# Patient Record
Sex: Female | Born: 1950
Health system: Southern US, Community
[De-identification: ages and names within clinical notes are randomized; demographics above are authoritative.]

## PROBLEM LIST (undated history)

## (undated) DIAGNOSIS — M62838 Other muscle spasm: Secondary | ICD-10-CM

## (undated) DIAGNOSIS — Z5189 Encounter for other specified aftercare: Secondary | ICD-10-CM

## (undated) DIAGNOSIS — B070 Plantar wart: Secondary | ICD-10-CM

## (undated) DIAGNOSIS — Z8619 Personal history of other infectious and parasitic diseases: Secondary | ICD-10-CM

## (undated) DIAGNOSIS — F419 Anxiety disorder, unspecified: Secondary | ICD-10-CM

## (undated) DIAGNOSIS — I739 Peripheral vascular disease, unspecified: Secondary | ICD-10-CM

## (undated) DIAGNOSIS — K219 Gastro-esophageal reflux disease without esophagitis: Secondary | ICD-10-CM

## (undated) DIAGNOSIS — E785 Hyperlipidemia, unspecified: Secondary | ICD-10-CM

## (undated) DIAGNOSIS — I10 Essential (primary) hypertension: Secondary | ICD-10-CM

## (undated) DIAGNOSIS — M25552 Pain in left hip: Secondary | ICD-10-CM

## (undated) DIAGNOSIS — T148XXA Other injury of unspecified body region, initial encounter: Secondary | ICD-10-CM

## (undated) HISTORY — DX: Personal history of other infectious and parasitic diseases: Z86.19

## (undated) HISTORY — DX: Anxiety disorder, unspecified: F41.9

## (undated) HISTORY — DX: Gastro-esophageal reflux disease without esophagitis: K21.9

## (undated) HISTORY — DX: Other muscle spasm: M62.838

## (undated) HISTORY — DX: Hyperlipidemia, unspecified: E78.5

## (undated) HISTORY — DX: Encounter for other specified aftercare: Z51.89

## (undated) HISTORY — DX: Plantar wart: B07.0

## (undated) HISTORY — DX: Other injury of unspecified body region, initial encounter: T14.8XXA

## (undated) HISTORY — DX: Pain in left hip: M25.552

## (undated) HISTORY — DX: Essential (primary) hypertension: I10

## (undated) HISTORY — PX: COLECTOMY: SHX59

---

## 1980-09-03 DIAGNOSIS — Z5189 Encounter for other specified aftercare: Secondary | ICD-10-CM

## 1980-09-03 HISTORY — DX: Encounter for other specified aftercare: Z51.89

## 1980-09-03 HISTORY — PX: ABDOMINAL HYSTERECTOMY: SHX81

## 1999-09-04 DIAGNOSIS — T148XXA Other injury of unspecified body region, initial encounter: Secondary | ICD-10-CM

## 1999-09-04 HISTORY — DX: Other injury of unspecified body region, initial encounter: T14.8XXA

## 2003-09-04 HISTORY — PX: CARPAL TUNNEL RELEASE: SHX101

## 2005-09-03 HISTORY — PX: OTHER SURGICAL HISTORY: SHX169

## 2012-06-26 ENCOUNTER — Ambulatory Visit
Admission: RE | Admit: 2012-06-26 | Discharge: 2012-06-26 | Disposition: A | Discharge status: Home / Self Care | Payer: Medicaid Other | Source: Ambulatory Visit | Attending: Family Medicine | Admitting: Family Medicine

## 2012-06-26 ENCOUNTER — Other Ambulatory Visit: Payer: Self-pay | Admitting: Family Medicine

## 2012-06-26 DIAGNOSIS — R05 Cough: Secondary | ICD-10-CM

## 2012-06-26 DIAGNOSIS — R059 Cough, unspecified: Secondary | ICD-10-CM

## 2012-06-26 DIAGNOSIS — M25579 Pain in unspecified ankle and joints of unspecified foot: Secondary | ICD-10-CM

## 2012-07-23 ENCOUNTER — Other Ambulatory Visit: Payer: Self-pay | Admitting: Family Medicine

## 2012-07-23 DIAGNOSIS — Z1231 Encounter for screening mammogram for malignant neoplasm of breast: Secondary | ICD-10-CM

## 2012-09-11 ENCOUNTER — Ambulatory Visit
Admission: RE | Admit: 2012-09-11 | Discharge: 2012-09-11 | Disposition: A | Discharge status: Home / Self Care | Payer: Medicaid Other | Source: Ambulatory Visit | Attending: Family Medicine | Admitting: Family Medicine

## 2012-09-11 DIAGNOSIS — Z1231 Encounter for screening mammogram for malignant neoplasm of breast: Secondary | ICD-10-CM

## 2013-08-25 ENCOUNTER — Other Ambulatory Visit: Payer: Self-pay

## 2013-08-25 DIAGNOSIS — Z1231 Encounter for screening mammogram for malignant neoplasm of breast: Secondary | ICD-10-CM

## 2013-09-17 ENCOUNTER — Ambulatory Visit: Payer: Self-pay

## 2013-09-22 ENCOUNTER — Ambulatory Visit
Admission: RE | Admit: 2013-09-22 | Discharge: 2013-09-22 | Disposition: A | Discharge status: Home / Self Care | Payer: Self-pay | Source: Ambulatory Visit

## 2013-09-22 DIAGNOSIS — Z1231 Encounter for screening mammogram for malignant neoplasm of breast: Secondary | ICD-10-CM

## 2013-09-23 ENCOUNTER — Encounter: Payer: Self-pay | Admitting: Gastroenterology

## 2013-10-01 ENCOUNTER — Ambulatory Visit (AMBULATORY_SURGERY_CENTER): Payer: Self-pay | Admitting: *Deleted

## 2013-10-01 VITALS — Ht 66.0 in | Wt 171.8 lb

## 2013-10-01 DIAGNOSIS — Z1211 Encounter for screening for malignant neoplasm of colon: Secondary | ICD-10-CM

## 2013-10-01 MED ORDER — NA SULFATE-K SULFATE-MG SULF 17.5-3.13-1.6 GM/177ML PO SOLN: 1.0000 | Freq: Once | ORAL | Status: DC

## 2013-10-01 NOTE — Progress Notes (Signed)
No allergies to eggs or soy. No problems with anesthesia.  

## 2013-10-05 ENCOUNTER — Other Ambulatory Visit: Payer: Self-pay | Admitting: Family Medicine

## 2013-10-05 ENCOUNTER — Ambulatory Visit
Admission: RE | Admit: 2013-10-05 | Discharge: 2013-10-05 | Disposition: A | Discharge status: Home / Self Care | Payer: Medicaid Other | Source: Ambulatory Visit | Attending: Family Medicine | Admitting: Family Medicine

## 2013-10-05 DIAGNOSIS — J209 Acute bronchitis, unspecified: Secondary | ICD-10-CM

## 2013-10-05 DIAGNOSIS — R059 Cough, unspecified: Secondary | ICD-10-CM

## 2013-10-05 DIAGNOSIS — R05 Cough: Secondary | ICD-10-CM

## 2013-10-15 ENCOUNTER — Ambulatory Visit (AMBULATORY_SURGERY_CENTER): Payer: Medicaid Other | Admitting: Gastroenterology

## 2013-10-15 ENCOUNTER — Encounter: Payer: Self-pay | Admitting: Gastroenterology

## 2013-10-15 VITALS — BP 195/109 | HR 72 | Temp 96.0°F | Resp 17 | Ht 66.0 in | Wt 171.0 lb

## 2013-10-15 DIAGNOSIS — D126 Benign neoplasm of colon, unspecified: Secondary | ICD-10-CM

## 2013-10-15 DIAGNOSIS — K5730 Diverticulosis of large intestine without perforation or abscess without bleeding: Secondary | ICD-10-CM

## 2013-10-15 DIAGNOSIS — Z1211 Encounter for screening for malignant neoplasm of colon: Secondary | ICD-10-CM

## 2013-10-15 DIAGNOSIS — K648 Other hemorrhoids: Secondary | ICD-10-CM

## 2013-10-15 MED ORDER — SODIUM CHLORIDE 0.9 % IV SOLN: 500.0000 mL | INTRAVENOUS | Status: DC

## 2013-10-15 NOTE — Patient Instructions (Signed)
YOU HAD AN ENDOSCOPIC PROCEDURE TODAY AT THE [facility]: Refer to the procedure report that was given to you for any specific questions about what was found during the examination.  If the procedure report does not answer your questions, please call your gastroenterologist to clarify.  If you requested that your care partner not be given the details of your procedure findings, then the procedure report has been included in a sealed envelope for you to review at your convenience later.  YOU SHOULD EXPECT: Some feelings of bloating in the abdomen. Passage of more gas than usual.  Walking can help get rid of the air that was put into your GI tract during the procedure and reduce the bloating. If you had a lower endoscopy (such as a colonoscopy or flexible sigmoidoscopy) you may notice spotting of blood in your stool or on the toilet paper. If you underwent a bowel prep for your procedure, then you may not have a normal bowel movement for a few days.  DIET: Your first meal following the procedure should be a light meal and then it is ok to progress to your normal diet.  A half-sandwich or bowl of soup is an example of a good first meal.  Heavy or fried foods are harder to digest and may make you feel nauseous or bloated.  Likewise meals heavy in dairy and vegetables can cause extra gas to form and this can also increase the bloating.  Drink plenty of fluids but you should avoid alcoholic beverages for 24 hours.  ACTIVITY: Your care partner should take you home directly after the procedure.  You should plan to take it easy, moving slowly for the rest of the day.  You can resume normal activity the day after the procedure however you should NOT DRIVE or use heavy machinery for 24 hours (because of the sedation medicines used during the test).    SYMPTOMS TO REPORT IMMEDIATELY: A gastroenterologist can be reached at any hour.  During normal business hours, 8:30 AM to 5:00 PM Monday through Friday,  call [phone].  After hours and on weekends, please call the GI answering service at [phone] who will take a message and have the physician on call contact you.   Following lower endoscopy (colonoscopy or flexible sigmoidoscopy):  Excessive amounts of blood in the stool  Significant tenderness or worsening of abdominal pains  Swelling of the abdomen that is new, acute  Fever of 100F or higher   FOLLOW UP: If any biopsies were taken you will be contacted by phone or by letter within the next 1-3 weeks.  Call your gastroenterologist if you have not heard about the biopsies in 3 weeks.  Our staff will call the home number listed on your records the next business day following your procedure to check on you and address any questions or concerns that you may have at that time regarding the information given to you following your procedure. This is a courtesy call and so if there is no answer at the home number and we have not heard from you through the emergency physician on call, we will assume that you have returned to your regular daily activities without incident.  SIGNATURES/CONFIDENTIALITY: You and/or your care partner have signed paperwork which will be entered into your electronic medical record.  These signatures attest to the fact that that the information above on your After Visit Summary has been reviewed and is understood.  Full responsibility of the confidentiality of   this discharge information lies with you and/or your care-partner.    Handouts were given to your care partner on polyps, diverticulosis, a high fiber diet with liberal fluid intake and hemorrhoids. You may resume your other current medications today. Please call if any questions or concerns.

## 2013-10-15 NOTE — Op Note (Signed)
[facility]  [address] [place], [address]   COLONOSCOPY PROCEDURE REPORT  PATIENT: Margaret Kelly, Margaret Kelly  MR#: [id number] BIRTHDATE: [birth year] , 62  yrs. old GENDER: Female ENDOSCOPIST: Inda Castle, MD REFERRED JK:KXFGH Blount, M.D. PROCEDURE DATE:  10/15/2013 PROCEDURE:   Colonoscopy with snare polypectomy First Screening Colonoscopy - Avg.  risk and is 50 yrs.  old or older - No.  Prior Negative Screening - Now for repeat screening. 10 or more years since last screening  History of Adenoma - Now for follow-up colonoscopy & has been > or = to 3 yrs.  N/A  Polyps Removed Today? Yes. ASA CLASS:   Class II INDICATIONS:Average risk patient for colon cancer. MEDICATIONS: MAC sedation, administered by CRNA and propofol (Diprivan) 400mg  IV  DESCRIPTION OF PROCEDURE:   After the risks benefits and alternatives of the procedure were thoroughly explained, informed consent was obtained.  On digital exam rectal prolapse was noted.        The LB [id number]  endoscope was introduced through the anus and advanced to the cecum, which was identified by both the appendix and ileocecal valve. No adverse events experienced.   The quality of the prep was Suprep good  The instrument was then slowly withdrawn as the colon was fully examined.      COLON FINDINGS: A sessile polyp measuring 10 mm in size was found in the sigmoid colon.  A polypectomy was performed.  The resection was complete and the polyp tissue was completely retrieved.   Internal hemorrhoids were found.   Mild diverticulosis was noted in the sigmoid colon.  Retroflexed views revealed no abnormalities. The time to cecum=5 minutes 32 seconds.  Withdrawal time=14 minutes 05 seconds.  The scope was withdrawn and the procedure completed. COMPLICATIONS: There were no complications.  ENDOSCOPIC IMPRESSION: 1.   Sessile polyp measuring 10 mm in size was found in the sigmoid colon; polypectomy was  performed 2.   Internal hemorrhoids 3.   Mild diverticulosis was noted in the sigmoid colon 4.   rectal prolapse  RECOMMENDATIONS: If the polyp(s) removed today are proven to be adenomatous (pre-cancerous) polyps, you will need a repeat colonoscopy in 5 years.  Otherwise you should continue to follow colorectal cancer screening guidelines for "routine risk" patients with colonoscopy in 10 years.  You will receive a letter within 1-2 weeks with the results of your biopsy as well as final recommendations.  Please call my office if you have not received a letter after 3 weeks.   eSigned:  Inda Castle, MD 10/15/2013 10:16 AM   cc:   PATIENT NAME:  Margaret Kelly, Margaret Kelly MR#: [id number]

## 2013-10-15 NOTE — Progress Notes (Signed)
I gave the pt a list of her blood pressure while in the recovery room to take to her primary md.  Blood pressure continued to creep up while in recovery room.   She stated she did take her b/p med this am.  Also pt and her friend were going to ride the bus.  She offered to call a cab if would be better.  I recommed ride in a cab rather than a bus.   Her friend was sent to call cab and he will call us when they are out front.  Pt in conference room waithing. Maw

## 2013-10-15 NOTE — Progress Notes (Signed)
Called to room to assist during endoscopic procedure.  Patient ID and intended procedure confirmed with present staff. Received instructions for my participation in the procedure from the performing physician.  

## 2013-10-15 NOTE — Progress Notes (Signed)
Pt states she needs no BP or IVs on her left side due to past injuries

## 2013-10-16 ENCOUNTER — Telehealth: Payer: Self-pay | Admitting: *Deleted

## 2013-10-16 NOTE — Telephone Encounter (Signed)
  Follow up Call-  Call back number 10/15/2013  Post procedure Call Back phone  # [phone]  Permission to leave phone message Yes     Patient questions:  Do you have a fever, pain , or abdominal swelling? no Pain Score  0 *  Have you tolerated food without any problems? yes  Have you been able to return to your normal activities? yes  Do you have any questions about your discharge instructions: Diet   no Medications  no Follow up visit  no  Do you have questions or concerns about your Care? no  Actions: * If pain score is 4 or above: No action needed, pain <4.

## 2013-10-21 ENCOUNTER — Encounter: Payer: Self-pay | Admitting: Gastroenterology

## 2014-08-20 ENCOUNTER — Other Ambulatory Visit: Payer: Self-pay

## 2014-08-20 DIAGNOSIS — Z1231 Encounter for screening mammogram for malignant neoplasm of breast: Secondary | ICD-10-CM

## 2014-09-24 ENCOUNTER — Ambulatory Visit: Payer: Self-pay

## 2014-09-29 ENCOUNTER — Ambulatory Visit
Admission: RE | Admit: 2014-09-29 | Discharge: 2014-09-29 | Disposition: A | Discharge status: Home / Self Care | Payer: Medicaid Other | Source: Ambulatory Visit

## 2014-09-29 DIAGNOSIS — Z1231 Encounter for screening mammogram for malignant neoplasm of breast: Secondary | ICD-10-CM

## 2015-08-23 ENCOUNTER — Other Ambulatory Visit: Payer: Self-pay

## 2015-08-23 DIAGNOSIS — Z1231 Encounter for screening mammogram for malignant neoplasm of breast: Secondary | ICD-10-CM

## 2015-10-03 ENCOUNTER — Ambulatory Visit: Payer: Self-pay

## 2015-10-11 ENCOUNTER — Ambulatory Visit
Admission: RE | Admit: 2015-10-11 | Discharge: 2015-10-11 | Disposition: A | Discharge status: Home / Self Care | Payer: Medicaid Other | Source: Ambulatory Visit

## 2015-10-11 DIAGNOSIS — Z1231 Encounter for screening mammogram for malignant neoplasm of breast: Secondary | ICD-10-CM

## 2015-10-13 ENCOUNTER — Encounter: Payer: Self-pay | Admitting: Family Medicine

## 2015-10-13 ENCOUNTER — Ambulatory Visit (INDEPENDENT_AMBULATORY_CARE_PROVIDER_SITE_OTHER): Payer: Medicaid Other | Admitting: Family Medicine

## 2015-10-13 VITALS — BP 162/78 | HR 89 | Temp 98.3°F | Resp 16 | Ht 64.0 in | Wt 154.0 lb

## 2015-10-13 DIAGNOSIS — M62838 Other muscle spasm: Secondary | ICD-10-CM

## 2015-10-13 DIAGNOSIS — I10 Essential (primary) hypertension: Secondary | ICD-10-CM | POA: Diagnosis not present

## 2015-10-13 DIAGNOSIS — M5442 Lumbago with sciatica, left side: Secondary | ICD-10-CM

## 2015-10-13 DIAGNOSIS — Z8619 Personal history of other infectious and parasitic diseases: Secondary | ICD-10-CM

## 2015-10-13 DIAGNOSIS — E785 Hyperlipidemia, unspecified: Secondary | ICD-10-CM | POA: Diagnosis not present

## 2015-10-13 DIAGNOSIS — G8929 Other chronic pain: Secondary | ICD-10-CM

## 2015-10-13 DIAGNOSIS — J069 Acute upper respiratory infection, unspecified: Secondary | ICD-10-CM

## 2015-10-13 DIAGNOSIS — H938X3 Other specified disorders of ear, bilateral: Secondary | ICD-10-CM

## 2015-10-13 HISTORY — DX: Hyperlipidemia, unspecified: E78.5

## 2015-10-13 HISTORY — DX: Personal history of other infectious and parasitic diseases: Z86.19

## 2015-10-13 HISTORY — DX: Other muscle spasm: M62.838

## 2015-10-13 LAB — COMPLETE METABOLIC PANEL WITH GFR
ALBUMIN: 4.4 g/dL (ref 3.6–5.1)
ALK PHOS: 54 U/L (ref 33–130)
ALT: 19 U/L (ref 6–29)
AST: 22 U/L (ref 10–35)
BUN: 16 mg/dL (ref 7–25)
CO2: 24 mmol/L (ref 20–31)
Calcium: 9.6 mg/dL (ref 8.6–10.4)
Chloride: 103 mmol/L (ref 98–110)
Creat: 0.72 mg/dL (ref 0.50–0.99)
GFR, Est African American: >89 mL/min (ref >=60)
Glucose, Bld: 90 mg/dL (ref 65–99)
Potassium: 4.7 mmol/L (ref 3.5–5.3)
SODIUM: 137 mmol/L (ref 135–146)
Total Bilirubin: 0.6 mg/dL (ref 0.2–1.2)
Total Protein: 7.2 g/dL (ref 6.1–8.1)

## 2015-10-13 LAB — CBC WITH DIFFERENTIAL/PLATELET
BASOS PCT: 1 % (ref 0–1)
Basophils Absolute: 0.1 10*3/uL (ref 0.0–0.1)
Eosinophils Absolute: 0.2 10*3/uL (ref 0.0–0.7)
Eosinophils Relative: 2 % (ref 0–5)
HCT: 42.1 % (ref 36.0–46.0)
HEMOGLOBIN: 14 g/dL (ref 12.0–15.0)
Lymphocytes Relative: 43 % (ref 12–46)
Lymphs Abs: 4 10*3/uL (ref 0.7–4.0)
MCH: 31 pg (ref 26.0–34.0)
MCHC: 33.3 g/dL (ref 30.0–36.0)
MCV: 93.3 fL (ref 78.0–100.0)
MONO ABS: 0.8 10*3/uL (ref 0.1–1.0)
MPV: 9.7 fL (ref 8.6–12.4)
Monocytes Relative: 9 % (ref 3–12)
NEUTROS ABS: 4.2 10*3/uL (ref 1.7–7.7)
Neutrophils Relative %: 45 % (ref 43–77)
Platelets: 339 10*3/uL (ref 150–400)
RBC: 4.51 MIL/uL (ref 3.87–5.11)
RDW: 13.4 % (ref 11.5–15.5)
WBC: 9.4 10*3/uL (ref 4.0–10.5)

## 2015-10-13 LAB — POCT URINALYSIS DIP (DEVICE)
BILIRUBIN URINE: NEGATIVE
Glucose, UA: NEGATIVE mg/dL
KETONES UR: NEGATIVE mg/dL
Leukocytes, UA: NEGATIVE
Nitrite: NEGATIVE
PH: 5.5 (ref 5.0–8.0)
Protein, ur: NEGATIVE mg/dL
SPECIFIC GRAVITY, URINE: 1.01 (ref 1.005–1.030)
Urobilinogen, UA: 0.2 mg/dL (ref 0.0–1.0)

## 2015-10-13 MED ORDER — AZITHROMYCIN 250 MG PO TABS: ORAL_TABLET | ORAL | Status: DC

## 2015-10-13 MED ORDER — CYCLOBENZAPRINE HCL 10 MG PO TABS: 10.0000 mg | ORAL_TABLET | Freq: Three times a day (TID) | ORAL | Status: DC | PRN

## 2015-10-13 NOTE — Patient Instructions (Signed)
Start Coricidin HBP Cough/Cold for increased cough Start Azithromycin 500 mg today and 250 mg on days 2-5  Follow up in office for fasting labs and BP check in 1 month   Sent referral to pain management

## 2015-10-13 NOTE — Progress Notes (Signed)
Subjective:    Patient ID: Margaret Kelly, female    DOB: [birth year], 65 y.o.   MRN: [id number]  HPI Margaret Kelly, a 65 year old female with a history of chronic pain and hypertension presents to establish care. She states that she was a patient of Dr. Lindwood Qua and has not been followed for the past several months. Patient reports a history of chronic back pain. She states that she sustained an accident while working in [place] in 2001. She lost 85% of usage to her left side. She maintains that she has had chronic back pain since that time. She was under the care of pain management while in [place] at [facility]. She states that she has constant lower back pain with sciatica. She is currently taking cyclobenzaprine for muscle spasms and Ibuprofen for pain relief with minimal relief. She says that pain is aggravated by pivoting, bending, prolonged standing, and walking. She denies fever, fatigue, dysuria, bladder or bowel incontinence.   Patient also reports a history of hypertension. She says that hypertension has been controlled on Lisinopril over the past several years. She has been taking medication consistently. She follows a low sodium diet. However, she reports that it is difficult to remain active due to chronic back pain.   Margaret Kelly also reports a history of hepatitis C contracted through a surgery in 1982,  by which she had previous treatment to cure.   Lastly, patient is complaining of upper respiratory symptoms for greater than 1 week. Symptoms include congested cough, nasal congestion, ear pain, and post nasal drip. She has been taking OTC Mucinex DM with minimal relief. She states that she has also increased water intake and vitamin C. She has a history of pneumonia and is a former smoker.   Past Medical History  Diagnosis Date  . Anxiety   . GERD (gastroesophageal reflux disease)   . Hyperlipidemia   . Hypertension   . Nerve damage 2001    left side  as result of fall  . Blood transfusion without reported diagnosis 1982  . Hx of hepatitis C 2010  . Hepatitis C    Past Surgical History  Procedure Laterality Date  . Abdominal hysterectomy  1982  . Foot spur Left 2007    foot  . Carpal tunnel release Left 2005  No Known Allergies   There is no immunization history on file for this patient. Review of Systems  Constitutional: Negative for fatigue and unexpected weight change.  HENT: Positive for congestion, ear pain, postnasal drip and sore throat.   Eyes: Negative.  Negative for visual disturbance.  Respiratory: Positive for cough. Negative for chest tightness.   Cardiovascular: Negative.   Gastrointestinal: Negative.   Endocrine: Negative.   Genitourinary: Negative.   Musculoskeletal: Positive for myalgias and back pain.       Chronic back spasms  Skin: Negative.   Neurological: Positive for dizziness and weakness (Left upper and lower extremities).  Hematological: Negative.   Psychiatric/Behavioral: The patient is nervous/anxious (periodically).         Objective:   Physical Exam  Constitutional: She appears well-developed and well-nourished. She has a sickly appearance.  HENT:  Right Ear: Hearing normal. Tympanic membrane is erythematous.  Left Ear: Hearing normal. Tympanic membrane is erythematous.  Nose: Mucosal edema present.  Mouth/Throat: Normal dentition. No dental caries. Posterior oropharyngeal erythema present.  Eyes: Conjunctivae, EOM and lids are normal. Pupils are equal, round, and reactive to light.  Neck: Trachea normal and normal range of motion.  Cardiovascular: Normal rate and regular rhythm.   Pulmonary/Chest: Effort normal and breath sounds normal. She has no decreased breath sounds.  Abdominal: Soft. Normal appearance. There is no tenderness.  Musculoskeletal:       Lumbar back: She exhibits decreased range of motion, tenderness and spasm.  Lymphadenopathy:       Head (right side): No submental,  no submandibular, no tonsillar and no preauricular adenopathy present.       Head (left side): No submental, no submandibular, no tonsillar and no preauricular adenopathy present.  Neurological: She is alert. No cranial nerve deficit or sensory deficit. She exhibits normal muscle tone.  3/5 strength to left upper extremity. Gait wnl  Skin: Skin is warm, dry and intact.  Psychiatric: She has a normal mood and affect. Her speech is normal and behavior is normal. Judgment and thought content normal.          BP 162/78 mmHg  Pulse 89  Temp(Src) 98.3 F (36.8 C) (Oral)  Resp 16  Ht 5\' 4"  (1.626 m)  Wt 154 lb (69.854 kg)  BMI 26.42 kg/m2 Assessment & Plan:   1. Essential hypertension Blood pressure is above goal on current medication regimen. Patient currently has increased pain. I will not increase medication at this juncture. Patient to return to office for a bp follow up. Will check GFR and urine protein.  - POCT urinalysis dipstick - COMPLETE METABOLIC PANEL WITH GFR - CBC with Differential - EKG 12-Lead - Ambulatory referral to Ophthalmology  2. Chronic bilateral low back pain with left-sided sciatica She was was previously a patient of Dr. Nelva Bush at [facility]. She states that she was last seen greater than 1 year ago for cortisone injections. I recommend that she schedule a follow-up appointment. Patient was previously under the care of pain management in [place] and she is complaining of chronic back pain. I will send a referral to pain management for further evaluation.  - Ambulatory referral to Pain Clinic - Sedimentation Rate - cyclobenzaprine (FLEXERIL) 10 MG tablet; Take 1 tablet (10 mg total) by mouth 3 (three) times daily as needed for muscle spasms.  Dispense: 30 tablet; Refill: 1  3. Muscle spasm of left lower extremity  - cyclobenzaprine (FLEXERIL) 10 MG tablet; Take 1 tablet (10 mg total) by mouth 3 (three) times daily as needed for muscle spasms.  Dispense: 30  tablet; Refill: 1  4. Hyperlipidemia LDL goal <100 Patient is currently on statin therapy. Will return for fasting lipid panel. Also recommend a lowfat, low carbohydrate diet.   5. Acute upper respiratory infection Recommend OTC Coricidin to assist with symptom of cough and congestion. Will also start Azithromycin. Recommend that patient increase fluid intake, rest and handwashing.  - azithromycin (ZITHROMAX) 250 MG tablet; Day 1 500 mg; Days 2-5 250 mg  Dispense: 6 tablet; Refill: 0  6. Congested ear, bilateral Refer to # 5   7. History of hepatitis C Patient contracted Hepatitis C following a surgery in 1982, she had complete treatment to cure with Interferon.    Routine Health Maintenance:  Hysterectomy Colonoscopy: 10/05/2013, recommended a follow up in 10 years   Tarisa Paola M, FNP   The patient was given clear instructions to go to ER or return to medical center if symptoms do not improve, worsen or new problems develop. The patient verbalized understanding. Will notify patient with laboratory results.

## 2015-10-14 LAB — SEDIMENTATION RATE: SED RATE: 12 mm/h (ref 0–30)

## 2015-11-07 ENCOUNTER — Other Ambulatory Visit: Payer: Self-pay | Admitting: Family Medicine

## 2015-11-11 ENCOUNTER — Other Ambulatory Visit (INDEPENDENT_AMBULATORY_CARE_PROVIDER_SITE_OTHER): Payer: Medicaid Other

## 2015-11-11 ENCOUNTER — Other Ambulatory Visit: Payer: Self-pay | Admitting: Family Medicine

## 2015-11-11 DIAGNOSIS — E785 Hyperlipidemia, unspecified: Secondary | ICD-10-CM

## 2015-11-11 LAB — LIPID PANEL
CHOL/HDL RATIO: 2.5 ratio (ref <=5.0)
CHOLESTEROL: 161 mg/dL (ref 125–200)
HDL: 65 mg/dL (ref >=46)
LDL Cholesterol: 77 mg/dL (ref <130)
Triglycerides: 96 mg/dL (ref <150)
VLDL: 19 mg/dL (ref <30)

## 2015-12-09 ENCOUNTER — Other Ambulatory Visit: Payer: Self-pay | Admitting: Family Medicine

## 2015-12-15 ENCOUNTER — Ambulatory Visit: Payer: Self-pay | Admitting: Family Medicine

## 2015-12-26 ENCOUNTER — Other Ambulatory Visit: Payer: Self-pay | Admitting: Family Medicine

## 2016-01-18 ENCOUNTER — Ambulatory Visit: Payer: Self-pay | Admitting: Family Medicine

## 2016-01-23 ENCOUNTER — Other Ambulatory Visit: Payer: Self-pay | Admitting: Family Medicine

## 2016-02-29 ENCOUNTER — Ambulatory Visit: Payer: Self-pay | Admitting: Family Medicine

## 2016-03-01 ENCOUNTER — Other Ambulatory Visit: Payer: Self-pay | Admitting: Family Medicine

## 2016-03-09 ENCOUNTER — Ambulatory Visit: Payer: Medicaid Other

## 2016-03-09 ENCOUNTER — Other Ambulatory Visit: Payer: Self-pay | Admitting: Family Medicine

## 2016-03-12 ENCOUNTER — Telehealth: Payer: Self-pay | Admitting: *Deleted

## 2016-03-12 ENCOUNTER — Other Ambulatory Visit: Payer: Self-pay | Admitting: Family Medicine

## 2016-03-12 DIAGNOSIS — I10 Essential (primary) hypertension: Secondary | ICD-10-CM

## 2016-03-12 MED ORDER — LISINOPRIL-HYDROCHLOROTHIAZIDE 10-12.5 MG PO TABS: 1.0000 | ORAL_TABLET | Freq: Every day | ORAL | Status: DC

## 2016-04-03 ENCOUNTER — Other Ambulatory Visit: Payer: Self-pay | Admitting: Family Medicine

## 2016-04-09 ENCOUNTER — Ambulatory Visit (INDEPENDENT_AMBULATORY_CARE_PROVIDER_SITE_OTHER): Payer: Medicaid Other | Admitting: Family Medicine

## 2016-04-09 ENCOUNTER — Other Ambulatory Visit (HOSPITAL_COMMUNITY): Payer: Self-pay | Admitting: Family Medicine

## 2016-04-09 ENCOUNTER — Encounter: Payer: Self-pay | Admitting: Family Medicine

## 2016-04-09 VITALS — BP 158/86 | HR 80 | Temp 98.1°F

## 2016-04-09 DIAGNOSIS — I10 Essential (primary) hypertension: Secondary | ICD-10-CM

## 2016-04-09 MED ORDER — AMLODIPINE BESYLATE 5 MG PO TABS: 5.0000 mg | ORAL_TABLET | Freq: Every day | ORAL | 1 refills | Status: DC

## 2016-04-23 ENCOUNTER — Ambulatory Visit: Payer: Medicaid Other

## 2016-04-23 ENCOUNTER — Other Ambulatory Visit: Payer: Self-pay | Admitting: Family Medicine

## 2016-04-23 DIAGNOSIS — I10 Essential (primary) hypertension: Secondary | ICD-10-CM

## 2016-04-23 MED ORDER — LISINOPRIL-HYDROCHLOROTHIAZIDE 20-25 MG PO TABS: 1.0000 | ORAL_TABLET | Freq: Every day | ORAL | 3 refills | Status: DC

## 2016-05-17 ENCOUNTER — Other Ambulatory Visit: Payer: Self-pay | Admitting: Family Medicine

## 2016-05-18 ENCOUNTER — Ambulatory Visit (INDEPENDENT_AMBULATORY_CARE_PROVIDER_SITE_OTHER): Payer: Medicaid Other | Admitting: Family Medicine

## 2016-05-18 ENCOUNTER — Encounter: Payer: Self-pay | Admitting: Family Medicine

## 2016-05-18 VITALS — BP 125/74 | HR 98 | Temp 98.6°F | Resp 18 | Ht 65.0 in | Wt 161.0 lb

## 2016-05-18 DIAGNOSIS — I10 Essential (primary) hypertension: Secondary | ICD-10-CM | POA: Diagnosis not present

## 2016-05-18 DIAGNOSIS — M7752 Other enthesopathy of left foot: Secondary | ICD-10-CM

## 2016-05-18 DIAGNOSIS — M779 Enthesopathy, unspecified: Secondary | ICD-10-CM | POA: Diagnosis not present

## 2016-05-18 LAB — CBC WITH DIFFERENTIAL/PLATELET
BASOS ABS: 0 {cells}/uL (ref 0–200)
Basophils Relative: 0 %
Eosinophils Absolute: 186 cells/uL (ref 15–500)
Eosinophils Relative: 2 %
HCT: 40.1 % (ref 35.0–45.0)
HEMOGLOBIN: 13.5 g/dL (ref 11.7–15.5)
Lymphocytes Relative: 41 %
Lymphs Abs: 3813 cells/uL (ref 850–3900)
MCH: 31.3 pg (ref 27.0–33.0)
MCHC: 33.7 g/dL (ref 32.0–36.0)
MCV: 92.8 fL (ref 80.0–100.0)
MPV: 9.5 fL (ref 7.5–12.5)
Monocytes Absolute: 744 cells/uL (ref 200–950)
Monocytes Relative: 8 %
Neutro Abs: 4557 cells/uL (ref 1500–7800)
Neutrophils Relative %: 49 %
Platelets: 368 10*3/uL (ref 140–400)
RBC: 4.32 MIL/uL (ref 3.80–5.10)
RDW: 13.8 % (ref 11.0–15.0)
WBC: 9.3 10*3/uL (ref 3.8–10.8)

## 2016-05-18 LAB — COMPLETE METABOLIC PANEL WITH GFR
ALK PHOS: 47 U/L (ref 33–130)
ALT: 21 U/L (ref 6–29)
AST: 24 U/L (ref 10–35)
Albumin: 4.4 g/dL (ref 3.6–5.1)
BILIRUBIN TOTAL: 0.4 mg/dL (ref 0.2–1.2)
BUN: 17 mg/dL (ref 7–25)
CALCIUM: 9.6 mg/dL (ref 8.6–10.4)
CO2: 29 mmol/L (ref 20–31)
CREATININE: 0.8 mg/dL (ref 0.50–0.99)
Chloride: 105 mmol/L (ref 98–110)
GFR, Est Non African American: 79 mL/min (ref >=60)
Glucose, Bld: 99 mg/dL (ref 65–99)
Potassium: 4.6 mmol/L (ref 3.5–5.3)
Sodium: 141 mmol/L (ref 135–146)
TOTAL PROTEIN: 6.7 g/dL (ref 6.1–8.1)

## 2016-05-18 MED ORDER — SIMVASTATIN 20 MG PO TABS: 20.0000 mg | ORAL_TABLET | Freq: Every day | ORAL | 0 refills | Status: DC

## 2016-05-18 MED ORDER — OMEPRAZOLE 40 MG PO CPDR: 40.0000 mg | DELAYED_RELEASE_CAPSULE | Freq: Every day | ORAL | 0 refills | Status: DC

## 2016-05-18 MED ORDER — CYCLOBENZAPRINE HCL 10 MG PO TABS: ORAL_TABLET | ORAL | 0 refills | Status: DC

## 2016-05-18 NOTE — Progress Notes (Signed)
Patient is here for FU heel pain  Patient complains of left foot heel pain being present and scaled at a 6 currently. Pain increases with weight bearing and walking.  Patient has taken BP medication today and has eaten today.  Patient declined the flu injection today.  Patient is requesting a refill on Amlodipine.

## 2016-05-18 NOTE — Progress Notes (Signed)
Margaret Kelly, is a 65 y.o. female  [id number]  [id number]  DOB - 1951-07-20  CC:  Chief Complaint  Patient presents with  . Foot Pain       HPI: Margaret Kelly is a 65 y.o. female here for follow-up of hypertension and left posterior foot pain. She has a history of anxiety, GERD, Hepatitis C (treated). Hyperlipidemiaand frequent muscle cramps  Medications prescribed her a lisinopril-hctz 20-25, cyclobenzapril 10, prilosec 40 mg. She has been on amlodipine 5 but says this does not agree with her. She has not taken for 5 days. And her BP is 125/74 today.She has a history of Hep C which has been treated. She request a referral to podiatrist for bone spur on her left posterior ankle/heel.   Health maintenance: She declines immunizations today. Her mammogram is UTD, she has had a hysterectomy. She reports smoking 1/2 pk of cigaretttes daily. Denies alcohol or drug use.    No Known Allergies Past Medical History:  Diagnosis Date  . Anxiety   . Blood transfusion without reported diagnosis 1982  . GERD (gastroesophageal reflux disease)   . Hepatitis C   . Hx of hepatitis C 2010  . Hyperlipidemia   . Hypertension   . Nerve damage 2001   left side as result of fall   Current Outpatient Prescriptions on File Prior to Visit  Medication Sig Dispense Refill  . ALPRAZolam (XANAX) 0.5 MG tablet Take 0.5 mg by mouth 2 (two) times daily as needed for anxiety.    . Calcium Carbonate (CALCIUM 600 PO) Take by mouth daily.    . Glucosamine HCl 1000 MG TABS Take by mouth daily.    Marland Kitchen ibuprofen (ADVIL,MOTRIN) 200 MG tablet Take 200 mg by mouth every 6 (six) hours as needed.    Marland Kitchen lisinopril-hydrochlorothiazide (PRINZIDE,ZESTORETIC) 20-25 MG tablet Take 1 tablet by mouth daily. 90 tablet 3  . Melatonin 10 MG CAPS Take by mouth. Takes 2 tablets at bedtime    . Multiple Vitamin (MULTIVITAMIN) tablet Take 1 tablet by mouth daily.    . Omega-3 Fatty Acids (FISH OIL) 1200 MG CAPS Take by mouth  daily.    . Probiotic Product (PROBIOTIC DAILY PO) Take by mouth Nightly.     No current facility-administered medications on file prior to visit.    Family History  Problem Relation Age of Onset  . Colon cancer Neg Hx   . Esophageal cancer Neg Hx   . Rectal cancer Neg Hx   . Stomach cancer Neg Hx    Social History   Social History  . Marital status: Unknown    Spouse name: N/A  . Number of children: N/A  . Years of education: N/A   Occupational History  . Not on file.   Social History Main Topics  . Smoking status: Former Smoker    Packs/day: 0.25    Types: Cigarettes  . Smokeless tobacco: Never Used     Comment: smokes 3 cigarettes a day  . Alcohol use Yes     Comment: rare  . Drug use: No  . Sexual activity: Not on file   Other Topics Concern  . Not on file   Social History Narrative  . No narrative on file    Review of Systems: Constitutional: Negative Skin: Negative HENT: Negative  Eyes: Negative  Neck: Negative Respiratory: Negative Cardiovascular: Negative Gastrointestinal: Negative Genitourinary: Negative  Musculoskeletal: Positive for hip pain  Neurological: Positive for occ headaches Hematological: Negative  Psychiatric/Behavioral: Negative  Objective:   Vitals:   05/18/16 1140  BP: 125/74  Pulse: 98  Resp: 18  Temp: 98.6 F (37 C)    Physical Exam: Constitutional: Patient appears well-developed and well-nourished. No distress. HENT: Normocephalic, atraumatic, External right and left ear normal. Oropharynx is clear and moist.  Eyes: Conjunctivae and EOM are normal. PERRLA, no scleral icterus. Neck: Normal ROM. Neck supple. No lymphadenopathy, No thyromegaly. CVS: RRR, S1/S2 +, no murmurs, no gallops, no rubs Pulmonary: Effort and breath sounds normal, no stridor, rhonchi, wheezes, rales.  Abdominal: Soft. Normoactive BS,, no distension, tenderness, rebound or guarding.  Musculoskeletal: Normal range of motion. No edema and no  tenderness. Prominent bone spur present l. foot Neuro: Alert.Normal muscle tone coordination. Non-focal Skin: Skin is warm and dry. No rash noted. Not diaphoretic. No erythema. No pallor. Psychiatric: Normal mood and affect. Behavior, judgment, thought content normal.  Lab Results  Component Value Date   WBC 9.4 10/13/2015   HGB 14.0 10/13/2015   HCT 42.1 10/13/2015   MCV 93.3 10/13/2015   PLT 339 10/13/2015   Lab Results  Component Value Date   CREATININE 0.72 10/13/2015   BUN 16 10/13/2015   NA 137 10/13/2015   K 4.7 10/13/2015   CL 103 10/13/2015   CO2 24 10/13/2015    No results found for: HGBA1C Lipid Panel     Component Value Date/Time   CHOL 161 11/11/2015 0807   TRIG 96 11/11/2015 0807   HDL 65 11/11/2015 0807   CHOLHDL 2.5 11/11/2015 0807   VLDL 19 11/11/2015 0807   LDLCALC 77 11/11/2015 0807       Assessment and plan:   1. Essential hypertension  - COMPLETE METABOLIC PANEL WITH GFR - CBC with Differential -Stop amoldipine.  (Blood pressure is in good control without)  2. Bone spur of left foot  - Ambulatory referral to Podiatry   Return in about 6 months (around 11/15/2016).  The patient was given clear instructions to go to ER or return to medical center if symptoms don't improve, worsen or new problems develop. The patient verbalized understanding.    Micheline Chapman FNP  05/18/2016, 4:06 PM

## 2016-07-02 ENCOUNTER — Telehealth: Payer: Self-pay | Admitting: Family Medicine

## 2016-07-02 NOTE — Telephone Encounter (Signed)
Patient called inquiring about podiatry referral. I provided her [facility] number and address and I refaxed referral to them. Patient to call back if she does not get appointment.

## 2016-07-11 ENCOUNTER — Telehealth: Payer: Self-pay

## 2016-07-11 ENCOUNTER — Other Ambulatory Visit: Payer: Self-pay

## 2016-07-11 ENCOUNTER — Other Ambulatory Visit: Payer: Self-pay | Admitting: Family Medicine

## 2016-07-11 MED ORDER — OMEPRAZOLE 40 MG PO CPDR: 40.0000 mg | DELAYED_RELEASE_CAPSULE | Freq: Every day | ORAL | 3 refills | Status: DC

## 2016-07-11 MED ORDER — SIMVASTATIN 20 MG PO TABS: 20.0000 mg | ORAL_TABLET | Freq: Every day | ORAL | 3 refills | Status: DC

## 2016-07-11 NOTE — Telephone Encounter (Signed)
This has been refilled.  Thanks. 

## 2016-07-11 NOTE — Telephone Encounter (Signed)
Refill for simvastatin sent into pharmacy. Thanks!  

## 2016-07-25 ENCOUNTER — Other Ambulatory Visit: Payer: Self-pay | Admitting: Family Medicine

## 2016-07-25 MED ORDER — CYCLOBENZAPRINE HCL 10 MG PO TABS: ORAL_TABLET | ORAL | 0 refills | Status: DC

## 2016-08-03 NOTE — Progress Notes (Signed)
Not seen by provider 

## 2016-09-11 ENCOUNTER — Other Ambulatory Visit: Payer: Self-pay | Admitting: Family Medicine

## 2016-09-11 DIAGNOSIS — Z1231 Encounter for screening mammogram for malignant neoplasm of breast: Secondary | ICD-10-CM

## 2016-09-13 ENCOUNTER — Encounter: Payer: Self-pay | Admitting: Family Medicine

## 2016-09-13 ENCOUNTER — Ambulatory Visit (INDEPENDENT_AMBULATORY_CARE_PROVIDER_SITE_OTHER): Payer: Medicaid Other | Admitting: Family Medicine

## 2016-09-13 VITALS — BP 138/82 | HR 80 | Temp 98.3°F | Resp 16 | Ht 65.0 in | Wt 167.0 lb

## 2016-09-13 DIAGNOSIS — Z114 Encounter for screening for human immunodeficiency virus [HIV]: Secondary | ICD-10-CM

## 2016-09-13 DIAGNOSIS — I10 Essential (primary) hypertension: Secondary | ICD-10-CM | POA: Diagnosis not present

## 2016-09-13 DIAGNOSIS — Z8619 Personal history of other infectious and parasitic diseases: Secondary | ICD-10-CM

## 2016-09-13 DIAGNOSIS — Z23 Encounter for immunization: Secondary | ICD-10-CM

## 2016-09-13 DIAGNOSIS — E785 Hyperlipidemia, unspecified: Secondary | ICD-10-CM

## 2016-09-13 DIAGNOSIS — M62838 Other muscle spasm: Secondary | ICD-10-CM | POA: Diagnosis not present

## 2016-09-13 LAB — CBC WITH DIFFERENTIAL/PLATELET
BASOS ABS: 0 {cells}/uL (ref 0–200)
BASOS PCT: 0 %
EOS PCT: 1 %
Eosinophils Absolute: 124 cells/uL (ref 15–500)
HCT: 38.9 % (ref 35.0–45.0)
Hemoglobin: 12.9 g/dL (ref 11.7–15.5)
LYMPHS ABS: 3224 {cells}/uL (ref 850–3900)
Lymphocytes Relative: 26 %
MCH: 31.8 pg (ref 27.0–33.0)
MCHC: 33.2 g/dL (ref 32.0–36.0)
MCV: 95.8 fL (ref 80.0–100.0)
MONOS PCT: 8 %
MPV: 9.2 fL (ref 7.5–12.5)
Monocytes Absolute: 992 cells/uL — ABNORMAL HIGH (ref 200–950)
NEUTROS ABS: 8060 {cells}/uL — AB (ref 1500–7800)
Neutrophils Relative %: 65 %
PLATELETS: 362 10*3/uL (ref 140–400)
RBC: 4.06 MIL/uL (ref 3.80–5.10)
RDW: 13.4 % (ref 11.0–15.0)
WBC: 12.4 10*3/uL — ABNORMAL HIGH (ref 3.8–10.8)

## 2016-09-13 LAB — COMPLETE METABOLIC PANEL WITH GFR
ALBUMIN: 4.3 g/dL (ref 3.6–5.1)
ALT: 24 U/L (ref 6–29)
AST: 24 U/L (ref 10–35)
Alkaline Phosphatase: 46 U/L (ref 33–130)
BILIRUBIN TOTAL: 0.4 mg/dL (ref 0.2–1.2)
BUN: 15 mg/dL (ref 7–25)
CO2: 24 mmol/L (ref 20–31)
Calcium: 9.5 mg/dL (ref 8.6–10.4)
Chloride: 105 mmol/L (ref 98–110)
Creat: 0.66 mg/dL (ref 0.50–0.99)
GLUCOSE: 89 mg/dL (ref 65–99)
POTASSIUM: 4.1 mmol/L (ref 3.5–5.3)
SODIUM: 139 mmol/L (ref 135–146)
Total Protein: 7.1 g/dL (ref 6.1–8.1)

## 2016-09-13 LAB — POCT URINALYSIS DIP (DEVICE)
Bilirubin Urine: NEGATIVE
GLUCOSE, UA: NEGATIVE mg/dL
Hgb urine dipstick: NEGATIVE
Ketones, ur: NEGATIVE mg/dL
Leukocytes, UA: NEGATIVE
Nitrite: NEGATIVE
PH: 6 (ref 5.0–8.0)
PROTEIN: NEGATIVE mg/dL
Specific Gravity, Urine: 1.015 (ref 1.005–1.030)
UROBILINOGEN UA: 0.2 mg/dL (ref 0.0–1.0)

## 2016-09-13 LAB — LIPID PANEL
CHOL/HDL RATIO: 2.7 ratio (ref <5.0)
CHOLESTEROL: 192 mg/dL (ref <200)
HDL: 72 mg/dL (ref >50)
LDL Cholesterol: 101 mg/dL — ABNORMAL HIGH (ref <100)
TRIGLYCERIDES: 93 mg/dL (ref <150)
VLDL: 19 mg/dL (ref <30)

## 2016-09-13 MED ORDER — CYCLOBENZAPRINE HCL 10 MG PO TABS: ORAL_TABLET | ORAL | 1 refills | Status: DC

## 2016-09-13 NOTE — Progress Notes (Signed)
Subjective:    Patient ID: Margaret Kelly, female    DOB: 1950-10-23, 66 y.o.   MRN: [place]:[id number]  HPI Margaret Kelly, a 66 year old female with a history of  Hypertension, muscles spasms, and hyperlipidemia presents for a follow up of chronic conditions. Patient reports that she has been taking medications consistently. Patient reports a history of chronic back pain. She states that she sustained an accident while working in [place] in 2001. She lost 85% of usage to her left side. She maintains that she has had chronic back pain since that time. She was under the care of pain management while in [place] at [facility]. She states that she has constant lower back pain with sciatica. She is currently taking cyclobenzaprine for muscle spasms and Ibuprofen for pain relief with moderate relief. She says that pain is aggravated by pivoting, bending, prolonged standing, and walking. She also receives steroid injections to right hip; she is followed by [facility].  She denies fever, fatigue, dysuria, bladder or bowel incontinence. Patient also reports a history of hypertension. She says that hypertension has been controlled on Lisinopril over the past several years. She has been taking medication consistently. She follows a low sodium diet. However, she reports that it is difficult to remain active due to chronic back pain.   Margaret Kelly also reports a history of hepatitis C contracted through a surgery in 1982,  by which she had previous treatment to cure.   Past Medical History:  Diagnosis Date  . Anxiety   . Blood transfusion without reported diagnosis 1982  . GERD (gastroesophageal reflux disease)   . Hepatitis C   . Hx of hepatitis C 2010  . Hyperlipidemia   . Hypertension   . Nerve damage 2001   left side as result of fall   Past Surgical History:  Procedure Laterality Date  . ABDOMINAL HYSTERECTOMY  1982  . CARPAL TUNNEL RELEASE Left 2005  . foot spur Left  2007   foot  No Known Allergies   There is no immunization history on file for this patient. Review of Systems  Constitutional: Negative for fatigue and unexpected weight change.  Eyes: Negative.   Respiratory: Positive for cough. Negative for chest tightness.   Cardiovascular: Negative.   Gastrointestinal: Negative.   Endocrine: Negative.  Negative for polydipsia and polyphagia.  Genitourinary: Negative.   Musculoskeletal: Positive for back pain and myalgias.       Chronic back spasms  Skin: Negative.   Neurological: Positive for dizziness and weakness (Left upper and lower extremities). Negative for numbness.  Hematological: Negative.        Objective:   Physical Exam  Constitutional: She appears well-developed and well-nourished. She has a sickly appearance.  HENT:  Right Ear: Hearing normal. Tympanic membrane is erythematous.  Left Ear: Hearing normal.  Increased cerumen to left ear canal  Eyes: Conjunctivae, EOM and lids are normal. Pupils are equal, round, and reactive to light.  Neck: Trachea normal and normal range of motion.  Cardiovascular: Normal rate and regular rhythm.   Pulmonary/Chest: Effort normal and breath sounds normal. She has no decreased breath sounds.  Abdominal: Soft. Normal appearance. There is no tenderness.  Musculoskeletal:       Lumbar back: She exhibits decreased range of motion, tenderness and spasm.  Lymphadenopathy:       Head (right side): No submental, no submandibular, no tonsillar and no preauricular adenopathy present.  Head (left side): No submental, no submandibular, no tonsillar and no preauricular adenopathy present.  Neurological: She is alert. No cranial nerve deficit or sensory deficit. She exhibits normal muscle tone.  3/5 strength to left upper extremity. Gait wnl  Skin: Skin is warm, dry and intact.  Psychiatric: She has a normal mood and affect. Her speech is normal and behavior is normal. Judgment and thought content  normal.          BP 138/82 (BP Location: Right Arm, Patient Position: Sitting, Cuff Size: Normal)   Pulse 80   Temp 98.3 F (36.8 C) (Oral)   Resp 16   Ht 5\' 5"  (1.651 m)   Wt 167 lb (75.8 kg)   SpO2 100%   BMI 27.79 kg/m  Assessment & Plan:  1. Essential hypertension Blood pressure is at goal on current medication regimen. The patient is asked to make an attempt to improve diet and exercise patterns to aid in medical management of this problem. - COMPLETE METABOLIC PANEL WITH GFR - CBC with Differential  2. Hyperlipidemia LDL goal <100 - Lipid Panel  3. Night muscle spasms - cyclobenzaprine (FLEXERIL) 10 MG tablet; TAKE 1 TABLET BY MOUTH THREE TIMES A DAY IF NEEDED  Dispense: 60 tablet; Refill: 1  4. Immunization due - Pneumococcal polysaccharide vaccine 23-valent greater than or equal to 2yo subcutaneous/IM  5. Need for Tdap vaccination  - Tdap vaccine greater than or equal to 7yo IM  6. Screening for HIV (human immunodeficiency virus)  - HIV antibody (with reflex)   Routine Health Maintenance:  Hysterectomy Colonoscopy: 10/05/2013, recommended a follow up in 10 years   Cailynn Bodnar M, FNP   The patient was given clear instructions to go to ER or return to medical center if symptoms do not improve, worsen or new problems develop. The patient verbalized understanding. Will notify patient with laboratory results.

## 2016-09-13 NOTE — Patient Instructions (Addendum)
Muscle Cramps and Spasms Muscle cramps and spasms occur when a muscle or muscles tighten and you have no control over this tightening (involuntary muscle contraction). They are a common problem and can develop in any muscle. The most common place is in the calf muscles of the leg. Both muscle cramps and muscle spasms are involuntary muscle contractions, but they also have differences:   Muscle cramps are sporadic and painful. They may last a few seconds to a quarter of an hour. Muscle cramps are often more forceful and last longer than muscle spasms.  Muscle spasms may or may not be painful. They may also last just a few seconds or much longer. CAUSES  It is uncommon for cramps or spasms to be due to a serious underlying problem. In many cases, the cause of cramps or spasms is unknown. Some common causes are:   Overexertion.   Overuse from repetitive motions (doing the same thing over and over).   Remaining in a certain position for a long period of time.   Improper preparation, form, or technique while performing a sport or activity.   Dehydration.   Injury.   Side effects of some medicines.   Abnormally low levels of the salts and ions in your blood (electrolytes), especially potassium and calcium. This could happen if you are taking water pills (diuretics) or you are pregnant.  Some underlying medical problems can make it more likely to develop cramps or spasms. These include, but are not limited to:   Diabetes.   Parkinson disease.   Hormone disorders, such as thyroid problems.   Alcohol abuse.   Diseases specific to muscles, joints, and bones.   Blood vessel disease where not enough blood is getting to the muscles.  HOME CARE INSTRUCTIONS   Stay well hydrated. Drink enough water and fluids to keep your urine clear or pale yellow.  It may be helpful to massage, stretch, and relax the affected muscle.  For tight or tense muscles, use a warm towel, heating  pad, or hot shower water directed to the affected area.  If you are sore or have pain after a cramp or spasm, applying ice to the affected area may relieve discomfort.  Put ice in a plastic bag.  Place a towel between your skin and the bag.  Leave the ice on for 15-20 minutes, 3-4 times a day.  Medicines used to treat a known cause of cramps or spasms may help reduce their frequency or severity. Only take over-the-counter or prescription medicines as directed by your caregiver. SEEK MEDICAL CARE IF:  Your cramps or spasms get more severe, more frequent, or do not improve over time.  MAKE SURE YOU:   Understand these instructions.  Will watch your condition.  Will get help right away if you are not doing well or get worse. This information is not intended to replace advice given to you by your health care provider. Make sure you discuss any questions you have with your health care provider. Document Released: 02/09/2002 Document Revised: 12/15/2012 Document Reviewed: 05/24/2015 Elsevier Interactive Patient Education  2017 [employer] Inc[employer]  Hypertension Hypertension, commonly called high blood pressure, is when the force of blood pumping through your arteries is too strong. Your arteries are the blood vessels that carry blood from your heart throughout your body. A blood pressure reading consists of a higher number over a lower number, such as 110/72. The higher number (systolic) is the pressure inside your arteries when your heart pumps. The lower  number (diastolic) is the pressure inside your arteries when your heart relaxes. Ideally you want your blood pressure below 120/80. Hypertension forces your heart to work harder to pump blood. Your arteries may become narrow or stiff. Having untreated or uncontrolled hypertension can cause heart attack, stroke, kidney disease, and other problems. What increases the risk? Some risk factors for high blood pressure are controllable. Others are  not. Risk factors you cannot control include:  Race. You may be at higher risk if you are African American.  Age. Risk increases with age.  Gender. Men are at higher risk than women before age 26 years. After age 57, women are at higher risk than men. Risk factors you can control include:  Not getting enough exercise or physical activity.  Being overweight.  Getting too much fat, sugar, calories, or salt in your diet.  Drinking too much alcohol. What are the signs or symptoms? Hypertension does not usually cause signs or symptoms. Extremely high blood pressure (hypertensive crisis) may cause headache, anxiety, shortness of breath, and nosebleed. How is this diagnosed? To check if you have hypertension, your health care provider will measure your blood pressure while you are seated, with your arm held at the level of your heart. It should be measured at least twice using the same arm. Certain conditions can cause a difference in blood pressure between your right and left arms. A blood pressure reading that is higher than normal on one occasion does not mean that you need treatment. If it is not clear whether you have high blood pressure, you may be asked to return on a different day to have your blood pressure checked again. Or, you may be asked to monitor your blood pressure at home for 1 or more weeks. How is this treated? Treating high blood pressure includes making lifestyle changes and possibly taking medicine. Living a healthy lifestyle can help lower high blood pressure. You may need to change some of your habits. Lifestyle changes may include:  Following the DASH diet. This diet is high in fruits, vegetables, and whole grains. It is low in salt, red meat, and added sugars.  Keep your sodium intake below 2,300 mg per day.  Getting at least 30-45 minutes of aerobic exercise at least 4 times per week.  Losing weight if necessary.  Not smoking.  Limiting alcoholic  beverages.  Learning ways to reduce stress. Your health care provider may prescribe medicine if lifestyle changes are not enough to get your blood pressure under control, and if one of the following is true:  You are 32-10 years of age and your systolic blood pressure is above 140.  You are 55 years of age or older, and your systolic blood pressure is above 150.  Your diastolic blood pressure is above 90.  You have diabetes, and your systolic blood pressure is over [id number] or your diastolic blood pressure is over 90.  You have kidney disease and your blood pressure is above 140/90.  You have heart disease and your blood pressure is above 140/90. Your personal target blood pressure may vary depending on your medical conditions, your age, and other factors. Follow these instructions at home:  Have your blood pressure rechecked as directed by your health care provider.  Take medicines only as directed by your health care provider. Follow the directions carefully. Blood pressure medicines must be taken as prescribed. The medicine does not work as well when you skip doses. Skipping doses also puts you at risk for  problems.  Do not smoke.  Monitor your blood pressure at home as directed by your health care provider. Contact a health care provider if:  You think you are having a reaction to medicines taken.  You have recurrent headaches or feel dizzy.  You have swelling in your ankles.  You have trouble with your vision. Get help right away if:  You develop a severe headache or confusion.  You have unusual weakness, numbness, or feel faint.  You have severe chest or abdominal pain.  You vomit repeatedly.  You have trouble breathing. This information is not intended to replace advice given to you by your health care provider. Make sure you discuss any questions you have with your health care provider. Document Released: 08/20/2005 Document Revised: 01/26/2016 Document Reviewed:  06/12/2013 Elsevier Interactive Patient Education  2017 Elsevier Inc.  Dyslipidemia Dyslipidemia is an imbalance of waxy, fat-like substances (lipids) in the blood. The body needs lipids in small amounts. Dyslipidemia often involves a high level of cholesterol or triglycerides, which are types of lipids. Common forms of dyslipidemia include:  High levels of bad cholesterol (LDL cholesterol). LDL is the type of cholesterol that causes fatty deposits (plaques) to build up in the blood vessels that carry blood away from your heart (arteries).  Low levels of good cholesterol (HDL cholesterol). HDL cholesterol is the type of cholesterol that protects against heart disease. High levels of HDL remove the LDL buildup from arteries.  High levels of triglycerides. Triglycerides are a fatty substance in the blood that is linked to a buildup of plaques in the arteries. You can develop dyslipidemia because of the genes you are born with (primary dyslipidemia) or changes that occur during your life (secondary dyslipidemia), or as a side effect of certain medical treatments. What are the causes? Primary dyslipidemia is caused by changes (mutations) in genes that are passed down through families (inherited). These mutations cause several types of dyslipidemia. Mutations can result in disorders that make the body produce too much LDL cholesterol or triglycerides, or not enough HDL cholesterol. These disorders may lead to heart disease, arterial disease, or stroke at an early age. Causes of secondary dyslipidemia include certain lifestyle choices and diseases that lead to dyslipidemia, such as:  Eating a diet that is high in animal fat.  Not getting enough activity or exercise (having a sedentary lifestyle).  Having diabetes, kidney disease, liver disease, or thyroid disease.  Drinking large amounts of alcohol.  Using certain types of drugs. What increases the risk? You may be at greater risk for  dyslipidemia if you are an older man or if you are a woman who has gone through menopause. Other risk factors include:  Having a family history of dyslipidemia.  Taking certain medicines, including birth control pills, steroids, some diuretics, beta-blockers, and some medicines forHIV.  Smoking cigarettes.  Eating a high-fat diet.  Drinking large amounts of alcohol.  Having certain medical conditions such as diabetes, polycystic ovary syndrome (PCOS), pregnancy, kidney disease, liver disease, or hypothyroidism.  Not exercising regularly.  Being overweight or obese with too much belly fat. What are the signs or symptoms? Dyslipidemia does not usually cause any symptoms. Very high lipid levels can cause fatty bumps under the skin (xanthomas) or a white or gray ring around the black center (pupil) of the eye. Very high triglyceride levels can cause inflammation of the pancreas (pancreatitis). How is this diagnosed? Your health care provider may diagnose dyslipidemia based on a routine blood test (fasting blood  test). Because most people do not have symptoms of the condition, this blood testing (lipid profile) is done on adults age 82 and older and is repeated every 5 years. This test checks:  Total cholesterol. This is a measure of the total amount of cholesterol in your blood, including LDL cholesterol, HDL cholesterol, and triglycerides. A healthy number is below 200.  LDL cholesterol. The target number for LDL cholesterol is different for each person, depending on individual risk factors. For most people, a number below 100 is healthy. Ask your health care provider what your LDL cholesterol number should be.  HDL cholesterol. An HDL level of 60 or higher is best because it helps to protect against heart disease. A number below 40 for men or below 33 for women increases the risk for heart disease.  Triglycerides. A healthy triglyceride number is below 150. If your lipid profile is  abnormal, your health care provider may do other blood tests to get more information about your condition. How is this treated? Treatment depends on the type of dyslipidemia that you have and your other risk factors for heart disease and stroke. Your health care provider will have a target range for your lipid levels based on this information. For many people, treatment starts with lifestyle changes, such as diet and exercise. Your health care provider may recommend that you:  Get regular exercise.  Make changes to your diet.  Quit smoking if you smoke. If diet changes and exercise do not help you reach your goals, your health care provider may also prescribe medicine to lower lipids. The most commonly prescribed type of medicine lowers your LDL cholesterol (statin drug). If you have a high triglyceride level, your provider may prescribe another type of drug (fibrate) or an omega-3 fish oil supplement, or both. Follow these instructions at home:  Take over-the-counter and prescription medicines only as told by your health care provider. This includes supplements.  Get regular exercise. Start an aerobic exercise and strength training program as told by your health care provider. Ask your health care provider what activities are safe for you. Your health care provider may recommend:  30 minutes of aerobic activity 4-6 days a week. Brisk walking is an example of aerobic activity.  Strength training 2 days a week.  Eat a healthy diet as told by your health care provider. This can help you reach and maintain a healthy weight, lower your LDL cholesterol, and raise your HDL cholesterol. It may help to work with a diet and nutrition specialist (dietitian) to make a plan that is right for you. Your dietitian or health care provider may recommend:  Limiting your calories, if you are overweight.  Eating more fruits, vegetables, whole grains, fish, and lean meats.  Limiting saturated fat, trans fat,  and cholesterol.  Follow instructions from your health care provider or dietitian about eating or drinking restrictions.  Limit alcohol intake to no more than one drink per day for nonpregnant women and two drinks per day for men. One drink equals 12 oz of beer, 5 oz of wine, or 1 oz of hard liquor.  Do not use any products that contain nicotine or tobacco, such as cigarettes and e-cigarettes. If you need help quitting, ask your health care provider.  Keep all follow-up visits as told by your health care provider. This is important. Contact a health care provider if:  You are having trouble sticking to your exercise or diet plan.  You are struggling to quit smoking  or control your use of alcohol. Summary  Dyslipidemia is an imbalance of waxy, fat-like substances (lipids) in the blood. The body needs lipids in small amounts. Dyslipidemia often involves a high level of cholesterol or triglycerides, which are types of lipids.  Treatment depends on the type of dyslipidemia that you have and your other risk factors for heart disease and stroke.  For many people, treatment starts with lifestyle changes, such as diet and exercise. Your health care provider may also prescribe medicine to lower lipids. This information is not intended to replace advice given to you by your health care provider. Make sure you discuss any questions you have with your health care provider. Document Released: 08/25/2013 Document Revised: 04/16/2016 Document Reviewed: 04/16/2016 Elsevier Interactive Patient Education  2017 [employer].

## 2016-09-14 LAB — HIV ANTIBODY (ROUTINE TESTING W REFLEX): HIV: NONREACTIVE

## 2016-10-12 ENCOUNTER — Ambulatory Visit
Admission: RE | Admit: 2016-10-12 | Discharge: 2016-10-12 | Disposition: A | Discharge status: Home / Self Care | Payer: Medicaid Other | Source: Ambulatory Visit | Attending: Family Medicine | Admitting: Family Medicine

## 2016-10-12 DIAGNOSIS — Z1231 Encounter for screening mammogram for malignant neoplasm of breast: Secondary | ICD-10-CM

## 2016-10-29 ENCOUNTER — Other Ambulatory Visit: Payer: Self-pay

## 2016-10-29 MED ORDER — SIMVASTATIN 20 MG PO TABS: 20.0000 mg | ORAL_TABLET | Freq: Every day | ORAL | 0 refills | Status: DC

## 2016-12-18 ENCOUNTER — Telehealth: Payer: Self-pay

## 2016-12-18 ENCOUNTER — Other Ambulatory Visit: Payer: Self-pay | Admitting: Family Medicine

## 2016-12-18 DIAGNOSIS — K219 Gastro-esophageal reflux disease without esophagitis: Secondary | ICD-10-CM

## 2016-12-18 MED ORDER — OMEPRAZOLE 40 MG PO CPDR: 40.0000 mg | DELAYED_RELEASE_CAPSULE | Freq: Every day | ORAL | 3 refills | Status: DC

## 2016-12-18 NOTE — Telephone Encounter (Signed)
I received a refill request for omeprazole 40mg . However when patient was seen in January 2018 she stated she was not taking this and it was taken off of her list. Please advise if this can be refilled to [facility]? Thanks!

## 2017-01-18 ENCOUNTER — Other Ambulatory Visit: Payer: Self-pay

## 2017-01-18 MED ORDER — SIMVASTATIN 20 MG PO TABS: 20.0000 mg | ORAL_TABLET | Freq: Every day | ORAL | 3 refills | Status: DC

## 2017-01-23 ENCOUNTER — Other Ambulatory Visit: Payer: Self-pay | Admitting: Family Medicine

## 2017-01-23 DIAGNOSIS — M62838 Other muscle spasm: Secondary | ICD-10-CM

## 2017-03-13 ENCOUNTER — Ambulatory Visit: Payer: Self-pay | Admitting: Family Medicine

## 2017-03-22 ENCOUNTER — Ambulatory Visit (INDEPENDENT_AMBULATORY_CARE_PROVIDER_SITE_OTHER): Payer: Medicaid Other | Admitting: Family Medicine

## 2017-03-22 ENCOUNTER — Encounter: Payer: Self-pay | Admitting: Family Medicine

## 2017-03-22 VITALS — BP 160/94 | HR 81 | Temp 98.6°F | Resp 16 | Ht 65.0 in | Wt 168.0 lb

## 2017-03-22 DIAGNOSIS — E785 Hyperlipidemia, unspecified: Secondary | ICD-10-CM | POA: Diagnosis not present

## 2017-03-22 DIAGNOSIS — M62838 Other muscle spasm: Secondary | ICD-10-CM

## 2017-03-22 DIAGNOSIS — I10 Essential (primary) hypertension: Secondary | ICD-10-CM

## 2017-03-22 LAB — COMPLETE METABOLIC PANEL WITH GFR
ALT: 18 U/L (ref 6–29)
AST: 20 U/L (ref 10–35)
Albumin: 4.6 g/dL (ref 3.6–5.1)
Alkaline Phosphatase: 65 U/L (ref 33–130)
BUN: 15 mg/dL (ref 7–25)
CHLORIDE: 103 mmol/L (ref 98–110)
CO2: 24 mmol/L (ref 20–31)
CREATININE: 0.78 mg/dL (ref 0.50–0.99)
Calcium: 10 mg/dL (ref 8.6–10.4)
GFR, Est Non African American: 81 mL/min (ref >=60)
GLUCOSE: 85 mg/dL (ref 65–99)
Potassium: 5.1 mmol/L (ref 3.5–5.3)
SODIUM: 138 mmol/L (ref 135–146)
Total Bilirubin: 0.5 mg/dL (ref 0.2–1.2)
Total Protein: 7.4 g/dL (ref 6.1–8.1)

## 2017-03-22 LAB — LIPID PANEL
Cholesterol: 263 mg/dL — ABNORMAL HIGH (ref <200)
HDL: 59 mg/dL (ref >50)
LDL CALC: 164 mg/dL — AB (ref <100)
TRIGLYCERIDES: 201 mg/dL — AB (ref <150)
Total CHOL/HDL Ratio: 4.5 Ratio (ref <5.0)
VLDL: 40 mg/dL — AB (ref <30)

## 2017-03-22 LAB — POCT URINALYSIS DIP (DEVICE)
BILIRUBIN URINE: NEGATIVE
GLUCOSE, UA: NEGATIVE mg/dL
Hgb urine dipstick: NEGATIVE
KETONES UR: NEGATIVE mg/dL
Leukocytes, UA: NEGATIVE
Nitrite: NEGATIVE
Protein, ur: NEGATIVE mg/dL
Specific Gravity, Urine: 1.015 (ref 1.005–1.030)
Urobilinogen, UA: 0.2 mg/dL (ref 0.0–1.0)
pH: 7 (ref 5.0–8.0)

## 2017-03-22 MED ORDER — CYCLOBENZAPRINE HCL 10 MG PO TABS
ORAL_TABLET | ORAL | 1 refills | Status: DC
Start: 2017-03-22 — End: 2017-09-04

## 2017-03-22 MED ORDER — AMLODIPINE BESYLATE 5 MG PO TABS: 5.0000 mg | ORAL_TABLET | Freq: Every day | ORAL | 1 refills | Status: DC

## 2017-03-22 NOTE — Progress Notes (Signed)
Subjective:    Patient ID: Margaret Kelly, female    DOB: 07/19/1951, 66 y.o.   MRN: [id number]  HPI Ms. Margaret Kelly, a 66 year old female with a history of  hypertension, muscles spasms, and hyperlipidemia presents for a follow up of chronic conditions. Patient reports that she has been taking medications consistently. She is currently taking cyclobenzaprine for muscle spasms and Naproxyn for pain relief with moderate relief. She says that pain is aggravated by pivoting, bending, prolonged standing, and walking. She also receives steroid injections to right hip; she is followed by [facility].  She denies fever, fatigue, dysuria, bladder or bowel incontinence.   Ms. Margaret Kelly also has a history of hypertension and hyperlipidemia. Blood pressure was previously controlled on Lisinopril-hydrochlorothizide. She discontinue medication 1 month ago due to increased cough. She is also no longer taking stating due to increased muscle pain. She follows a low sodium diet. However, she reports that it is difficult to remain active due to chronic back pain. She denies fatigue, chest pain, lower extremity edema, or heart palpitations.    Past Medical History:  Diagnosis Date  . Anxiety   . Blood transfusion without reported diagnosis 1982  . GERD (gastroesophageal reflux disease)   . Hepatitis C   . Hx of hepatitis C 2010  . Hyperlipidemia   . Hypertension   . Nerve damage 2001   left side as result of fall   Past Surgical History:  Procedure Laterality Date  . ABDOMINAL HYSTERECTOMY  1982  . CARPAL TUNNEL RELEASE Left 2005  . foot spur Left 2007   foot  No Known Allergies  Immunization History  Administered Date(s) Administered  . Pneumococcal Polysaccharide-23 09/13/2016  . Tdap 09/13/2016   Review of Systems  Constitutional: Negative for fatigue and unexpected weight change.  Eyes: Negative.   Respiratory: Negative for chest tightness.   Cardiovascular: Negative.    Gastrointestinal: Negative.   Endocrine: Negative.  Negative for polydipsia and polyphagia.  Genitourinary: Negative.   Musculoskeletal: Positive for back pain and myalgias.       Chronic back spasms  Skin: Negative.   Neurological: Positive for weakness (Left upper and lower extremities). Negative for numbness.  Hematological: Negative.        Objective:   Physical Exam  Constitutional: She appears well-developed and well-nourished. She has a sickly appearance.  HENT:  Right Ear: Hearing normal. Tympanic membrane is erythematous.  Left Ear: Hearing normal.  Increased cerumen to left ear canal  Eyes: Pupils are equal, round, and reactive to light. Conjunctivae, EOM and lids are normal.  Neck: Trachea normal and normal range of motion.  Cardiovascular: Normal rate and regular rhythm.   Pulmonary/Chest: Effort normal and breath sounds normal. She has no decreased breath sounds.  Abdominal: Soft. Normal appearance. There is no tenderness.  Musculoskeletal:       Lumbar back: She exhibits decreased range of motion, tenderness and spasm.  Lymphadenopathy:       Head (right side): No submental, no submandibular, no tonsillar and no preauricular adenopathy present.       Head (left side): No submental, no submandibular, no tonsillar and no preauricular adenopathy present.  Neurological: She is alert. No cranial nerve deficit or sensory deficit. She exhibits normal muscle tone.  3/5 strength to left upper extremity. Gait wnl  Skin: Skin is warm, dry and intact.  Psychiatric: She has a normal mood and affect. Her speech is normal and behavior is normal. Judgment and thought content  normal.        BP (!) 160/94 (BP Location: Right Arm, Patient Position: Sitting, Cuff Size: Normal) Comment: manually  Pulse 81   Temp 98.6 F (37 C) (Oral)   Resp 16   Ht 5\' 5"  (1.651 m)   Wt 168 lb (76.2 kg)   SpO2 97%   BMI 27.96 kg/m  Assessment & Plan:  1. Essential hypertension Blood pressure  is above goal. Will start a trial of Amlodipine 5 mg daily. Discussed potential s/e at length and provided written information. Reviewed urinalysis, no proteinuria present. Will follow up in 1 month for hypertension.  - COMPLETE METABOLIC PANEL WITH GFR - amLODipine (NORVASC) 5 MG tablet; Take 1 tablet (5 mg total) by mouth daily.  Dispense: 30 tablet; Refill: 1 - POCT urinalysis dip (device)  2. Hyperlipidemia LDL goal <100 Recommend OTC omega 3 fatty acid tablet. The patient is asked to make an attempt to improve diet and exercise patterns to aid in medical management of this problem.  - Lipid Panel  3. Night muscle spasms Muscle spasms controlled on cyclobenzaprine, will continue.  - cyclobenzaprine (FLEXERIL) 10 MG tablet; TAKE 1 TABLET BY MOUTH THREE TIMES A DAY IF NEEDED  Dispense: 60 tablet; Refill: 1  4. Muscle spasm of left lower extremity - cyclobenzaprine (FLEXERIL) 10 MG tablet; TAKE 1 TABLET BY MOUTH THREE TIMES A DAY IF NEEDED  Dispense: 60 tablet; Refill: 1  RTC: 1 month for hypertension   [name]  MSN, FNP-C [place] [address] [place], [place] [address] [phone]

## 2017-03-22 NOTE — Patient Instructions (Addendum)
Start a trial of Amlodipine 5 mg daily. The patient is asked to make an attempt to improve diet and exercise patterns to aid in medical management of this problem. Will follow up in 1 month for medication mangement  Aliskiren; Amlodipine oral tablets What is this medicine? ALISKIREN; AMLODIPINE (a lis KYE ren; am LOE di peen) is a combination of a renin inhibitor and a calcium channel blocker. This medicine is used to treat high blood pressure. This medicine may be used for other purposes; ask your health care provider or pharmacist if you have questions. COMMON BRAND NAME(S): Tekamlo What should I tell my health care provider before I take this medicine? They need to know if you have any of these conditions: -dehydration -diabetes -heart problems like heart failure or aortic stenosis -kidney disease -liver disease -an unusual or allergic reaction to aliskiren, amlodipine, other medicines, foods, dyes, or preservatives -pregnant or trying to get pregnant -breast-feeding How should I use this medicine? Take this medicine by mouth with a glass of water. Follow the directions on your prescription label. You may take this medicine with or without food, but try to take it the same way every time. Take your medicine at regular intervals. Do not take it more often than directed. Do not stop taking except on your doctor's advice. Talk to your pediatrician regarding the use of this medicine in children. Special care may be needed. Overdosage: If you think you have taken too much of this medicine contact a poison control center or emergency room at once. NOTE: This medicine is only for you. Do not share this medicine with others. What if I miss a dose? If you miss a dose, take it as soon as you can. If it is almost time for your next dose, take only that dose. Do not take double or extra doses. What may interact with this medicine? -atorvastatin -certain medicines for fungal infections like  ketoconazole and itraconazole -cyclosporine -diuretics -medicines for blood pressure -potassium supplements -salt substitutes with potassium This list may not describe all possible interactions. Give your health care provider a list of all the medicines, herbs, non-prescription drugs, or dietary supplements you use. Also tell them if you smoke, drink alcohol, or use illegal drugs. Some items may interact with your medicine. What should I watch for while using this medicine? Visit your doctor or health care professional for regular checks on your progress. Check your blood pressure as directed. Ask your doctor or health care professional what your blood pressure should be and when you should contact him or her. If you have diabetes and are taking a medicine called an angiotensin-receptor-blocker (ARB) or angiotensin-converting-enzyme-inhibitor (ACE inhibitor), do not take this medicine. Talk to your doctor or health care professional for more information. Women should inform their doctor if they wish to become pregnant or think they might be pregnant. There is a potential for serious side effects to an unborn child. Talk to your health care professional or pharmacist for more information. This medicine may make you feel confused, dizzy or lightheaded. Do not drive, use machinery, or do anything that needs mental alertness until you know how this medicine affects you. To reduce the risk of dizzy or fainting spells, do not sit or stand up quickly, especially if you are an older patient. Avoid alcoholic drinks; they can make you more dizzy. What side effects may I notice from receiving this medicine? Side effects that you should report to your doctor or health care professional as  soon as possible: -allergic reactions like skin rash or hives, swelling of the hands, feet, face, lips, throat, or tongue -breathing problems -chest pain -fast, irregular heartbeat -feeling faint or lightheaded, falls -low  blood pressure -seizures -swelling of ankles, feet, hands Side effects that usually do not require medical attention (report to your doctor or health care professional if they continue or are bothersome): -cough -diarrhea -dry mouth -facial flushing -nausea, vomiting -upset stomach -weak or tired This list may not describe all possible side effects. Call your doctor for medical advice about side effects. You may report side effects to FDA at 1-800-FDA-1088. Where should I keep my medicine? Keep out of the reach of children. Store at room temperature between 15 and 30 degrees C (59 and 86 degrees F). Protect from heat and moisture. Throw away any unused medicine after the expiration date. NOTE: This sheet is a summary. It may not cover all possible information. If you have questions about this medicine, talk to your doctor, pharmacist, or health care provider.  2018 Elsevier/Gold Standard (2015-09-22 10:43:52)   DASH Eating Plan DASH stands for "Dietary Approaches to Stop Hypertension." The DASH eating plan is a healthy eating plan that has been shown to reduce high blood pressure (hypertension). It may also reduce your risk for type 2 diabetes, heart disease, and stroke. The DASH eating plan may also help with weight loss. What are tips for following this plan? General guidelines  Avoid eating more than 2,300 mg (milligrams) of salt (sodium) a day. If you have hypertension, you may need to reduce your sodium intake to 1,500 mg a day.  Limit alcohol intake to no more than 1 drink a day for nonpregnant women and 2 drinks a day for men. One drink equals 12 oz of beer, 5 oz of wine, or 1 oz of hard liquor.  Work with your health care provider to maintain a healthy body weight or to lose weight. Ask what an ideal weight is for you.  Get at least 30 minutes of exercise that causes your heart to beat faster (aerobic exercise) most days of the week. Activities may include walking, swimming,  or biking.  Work with your health care provider or diet and nutrition specialist (dietitian) to adjust your eating plan to your individual calorie needs. Reading food labels  Check food labels for the amount of sodium per serving. Choose foods with less than 5 percent of the Daily Value of sodium. Generally, foods with less than 300 mg of sodium per serving fit into this eating plan.  To find whole grains, look for the word "whole" as the first word in the ingredient list. Shopping  Buy products labeled as "low-sodium" or "no salt added."  Buy fresh foods. Avoid canned foods and premade or frozen meals. Cooking  Avoid adding salt when cooking. Use salt-free seasonings or herbs instead of table salt or sea salt. Check with your health care provider or pharmacist before using salt substitutes.  Do not fry foods. Cook foods using healthy methods such as baking, boiling, grilling, and broiling instead.  Cook with heart-healthy oils, such as olive, canola, soybean, or sunflower oil. Meal planning   Eat a balanced diet that includes: ? 5 or more servings of fruits and vegetables each day. At each meal, try to fill half of your plate with fruits and vegetables. ? Up to 6-8 servings of whole grains each day. ? Less than 6 oz of lean meat, poultry, or fish each day. A 3-oz  serving of meat is about the same size as a deck of cards. One egg equals 1 oz. ? 2 servings of low-fat dairy each day. ? A serving of nuts, seeds, or beans 5 times each week. ? Heart-healthy fats. Healthy fats called Omega-3 fatty acids are found in foods such as flaxseeds and coldwater fish, like sardines, salmon, and mackerel.  Limit how much you eat of the following: ? Canned or prepackaged foods. ? Food that is high in trans fat, such as fried foods. ? Food that is high in saturated fat, such as fatty meat. ? Sweets, desserts, sugary drinks, and other foods with added sugar. ? Full-fat dairy products.  Do not salt  foods before eating.  Try to eat at least 2 vegetarian meals each week.  Eat more home-cooked food and less restaurant, buffet, and fast food.  When eating at a restaurant, ask that your food be prepared with less salt or no salt, if possible. What foods are recommended? The items listed may not be a complete list. Talk with your dietitian about what dietary choices are best for you. Grains Whole-grain or whole-wheat bread. Whole-grain or whole-wheat pasta. Brown rice. [place]. Bulgur. Whole-grain and low-sodium cereals. Pita bread. Low-fat, low-sodium crackers. Whole-wheat flour tortillas. Vegetables Fresh or frozen vegetables (raw, steamed, roasted, or grilled). Low-sodium or reduced-sodium tomato and vegetable juice. Low-sodium or reduced-sodium tomato sauce and tomato paste. Low-sodium or reduced-sodium canned vegetables. Fruits All fresh, dried, or frozen fruit. Canned fruit in natural juice (without added sugar). Meat and other protein foods Skinless chicken or [place]. Ground chicken or [place]. Pork with fat trimmed off. Fish and seafood. Egg whites. Dried beans, peas, or lentils. Unsalted nuts, nut butters, and seeds. Unsalted canned beans. Lean cuts of beef with fat trimmed off. Low-sodium, lean deli meat. Dairy Low-fat (1%) or fat-free (skim) milk. Fat-free, low-fat, or reduced-fat cheeses. Nonfat, low-sodium ricotta or cottage cheese. Low-fat or nonfat yogurt. Low-fat, low-sodium cheese. Fats and oils Soft margarine without trans fats. Vegetable oil. Low-fat, reduced-fat, or light mayonnaise and salad dressings (reduced-sodium). Canola, safflower, olive, soybean, and sunflower oils. Avocado. Seasoning and other foods Herbs. Spices. Seasoning mixes without salt. Unsalted popcorn and pretzels. Fat-free sweets. What foods are not recommended? The items listed may not be a complete list. Talk with your dietitian about what dietary choices are best for you. Grains Baked goods  made with fat, such as croissants, muffins, or some breads. Dry pasta or rice meal packs. Vegetables Creamed or fried vegetables. Vegetables in a cheese sauce. Regular canned vegetables (not low-sodium or reduced-sodium). Regular canned tomato sauce and paste (not low-sodium or reduced-sodium). Regular tomato and vegetable juice (not low-sodium or reduced-sodium). Angie Fava. Olives. Fruits Canned fruit in a light or heavy syrup. Fried fruit. Fruit in cream or butter sauce. Meat and other protein foods Fatty cuts of meat. Ribs. Fried meat. Berniece Salines. Sausage. Bologna and other processed lunch meats. Salami. Fatback. Hotdogs. Bratwurst. Salted nuts and seeds. Canned beans with added salt. Canned or smoked fish. Whole eggs or egg yolks. Chicken or [place] with skin. Dairy Whole or 2% milk, cream, and half-and-half. Whole or full-fat cream cheese. Whole-fat or sweetened yogurt. Full-fat cheese. Nondairy creamers. Whipped toppings. Processed cheese and cheese spreads. Fats and oils Butter. Stick margarine. Lard. Shortening. Ghee. Bacon fat. Tropical oils, such as coconut, palm kernel, or palm oil. Seasoning and other foods Salted popcorn and pretzels. Onion salt, garlic salt, seasoned salt, table salt, and sea salt. Worcestershire sauce. Tartar sauce. Barbecue  sauce. Teriyaki sauce. Soy sauce, including reduced-sodium. Steak sauce. Canned and packaged gravies. Fish sauce. Oyster sauce. Cocktail sauce. Horseradish that you find on the shelf. Ketchup. Mustard. Meat flavorings and tenderizers. Bouillon cubes. Hot sauce and Tabasco sauce. Premade or packaged marinades. Premade or packaged taco seasonings. Relishes. Regular salad dressings. Where to find more information:  National Heart, Lung, and [place]: https://wilson-eaton.com/  American Heart Association: www.heart.org Summary  The DASH eating plan is a healthy eating plan that has been shown to reduce high blood pressure (hypertension). It may also reduce  your risk for type 2 diabetes, heart disease, and stroke.  With the DASH eating plan, you should limit salt (sodium) intake to 2,300 mg a day. If you have hypertension, you may need to reduce your sodium intake to 1,500 mg a day.  When on the DASH eating plan, aim to eat more fresh fruits and vegetables, whole grains, lean proteins, low-fat dairy, and heart-healthy fats.  Work with your health care provider or diet and nutrition specialist (dietitian) to adjust your eating plan to your individual calorie needs. This information is not intended to replace advice given to you by your health care provider. Make sure you discuss any questions you have with your health care provider. Document Released: 08/09/2011 Document Revised: 08/13/2016 Document Reviewed: 08/13/2016 Elsevier Interactive Patient Education  2017 [place] Choices to Lower Your Triglycerides Triglycerides are a type of fat in your blood. High levels of triglycerides can increase the risk of heart disease and stroke. If your triglyceride levels are high, the foods you eat and your eating habits are very important. Choosing the right foods can help lower your triglycerides. What general guidelines do I need to follow?  Lose weight if you are overweight.  Limit or avoid alcohol.  Fill one half of your plate with vegetables and green salads.  Limit fruit to two servings a day. Choose fruit instead of juice.  Make one fourth of your plate whole grains. Look for the word "whole" as the first word in the ingredient list.  Fill one fourth of your plate with lean protein foods.  Enjoy fatty fish (such as salmon, mackerel, sardines, and tuna) three times a week.  Choose healthy fats.  Limit foods high in starch and sugar.  Eat more home-cooked food and less restaurant, buffet, and fast food.  Limit fried foods.  Cook foods using methods other than frying.  Limit saturated fats.  Check ingredient lists to avoid  foods with partially hydrogenated oils (trans fats) in them. What foods can I eat? Grains Whole grains, such as whole wheat or whole grain breads, crackers, cereals, and pasta. Unsweetened oatmeal, bulgur, barley, quinoa, or brown rice. Corn or whole wheat flour tortillas. Vegetables Fresh or frozen vegetables (raw, steamed, roasted, or grilled). Green salads. Fruits All fresh, canned (in natural juice), or frozen fruits. Meat and Other Protein Products Ground beef (85% or leaner), grass-fed beef, or beef trimmed of fat. Skinless chicken or [place]. Ground chicken or [place]. Pork trimmed of fat. All fish and seafood. Eggs. Dried beans, peas, or lentils. Unsalted nuts or seeds. Unsalted canned or dry beans. Dairy Low-fat dairy products, such as skim or 1% milk, 2% or reduced-fat cheeses, low-fat ricotta or cottage cheese, or plain low-fat yogurt. Fats and Oils Tub margarines without trans fats. Light or reduced-fat mayonnaise and salad dressings. Avocado. Safflower, olive, or canola oils. Natural peanut or almond butter. The items listed above may not be a complete list of  recommended foods or beverages. Contact your dietitian for more options. What foods are not recommended? Grains White bread. White pasta. White rice. Cornbread. Bagels, pastries, and croissants. Crackers that contain trans fat. Vegetables White potatoes. Corn. Creamed or fried vegetables. Vegetables in a cheese sauce. Fruits Dried fruits. Canned fruit in light or heavy syrup. Fruit juice. Meat and Other Protein Products Fatty cuts of meat. Ribs, chicken wings, bacon, sausage, bologna, salami, chitterlings, fatback, hot dogs, bratwurst, and packaged luncheon meats. Dairy Whole or 2% milk, cream, half-and-half, and cream cheese. Whole-fat or sweetened yogurt. Full-fat cheeses. Nondairy creamers and whipped toppings. Processed cheese, cheese spreads, or cheese curds. Sweets and Desserts Corn syrup, sugars, honey, and  molasses. Candy. Jam and jelly. Syrup. Sweetened cereals. Cookies, pies, cakes, donuts, muffins, and ice cream. Fats and Oils Butter, stick margarine, lard, shortening, ghee, or bacon fat. Coconut, palm kernel, or palm oils. Beverages Alcohol. Sweetened drinks (such as sodas, lemonade, and fruit drinks or punches). The items listed above may not be a complete list of foods and beverages to avoid. Contact your dietitian for more information. This information is not intended to replace advice given to you by your health care provider. Make sure you discuss any questions you have with your health care provider. Document Released: 06/07/2004 Document Revised: 01/26/2016 Document Reviewed: 06/24/2013 Elsevier Interactive Patient Education  2017 [employer].

## 2017-03-23 ENCOUNTER — Telehealth: Payer: Self-pay | Admitting: Family Medicine

## 2017-03-23 DIAGNOSIS — E785 Hyperlipidemia, unspecified: Secondary | ICD-10-CM

## 2017-03-23 MED ORDER — FENOFIBRATE 145 MG PO TABS: 145.0000 mg | ORAL_TABLET | Freq: Every day | ORAL | 1 refills | Status: DC

## 2017-03-23 NOTE — Telephone Encounter (Signed)
Nautia Lem, a 66 year old female with a history of hyperlipidemia. Patient discontinued statin therapy due to myalgias. Reviewed lipid panel, cholesterol elevated. Will start a trial of Tricor 145 mg every evening with dinner.   The 10-year ASCVD risk score [name] DC Brooke Bonito., et al., 2013) is: 10.6%   Values used to calculate the score:     Age: 89 years     Sex: Female     Is Non-Hispanic African American: No     Diabetic: No     Tobacco smoker: No     Systolic Blood Pressure: 211 mmHg     Is BP treated: Yes     HDL Cholesterol: 59 mg/dL     Total Cholesterol: 263 mg/dL  Meds ordered this encounter  Medications  . fenofibrate (TRICOR) 145 MG tablet    Sig: Take 1 tablet (145 mg total) by mouth daily.    Dispense:  90 tablet    Refill:  [place]  MSN, FNP-C [facility] [address] [place], [place] [address] [phone]

## 2017-03-25 NOTE — Telephone Encounter (Signed)
Called, no answer. Left message for patient to call back. Thanks!  

## 2017-03-25 NOTE — Telephone Encounter (Signed)
Patient returned call. I advised of elevated cholesterol. I advised that we are going to start a Trial of Tricor 145mg  to take once a day after dinner. Advised that we would recheck cholesterol in 3 months.  Advised that patient eat a low fat/low carb diet over 5 to 6 small meals daily, drink 6 to 8 glasses of water daily, and exercise 150 minutes weekly of cardio. Patient verbalized understanding and was asked to keep next follow up appointment. Thanks!

## 2017-04-02 ENCOUNTER — Ambulatory Visit (INDEPENDENT_AMBULATORY_CARE_PROVIDER_SITE_OTHER): Payer: Medicaid Other | Admitting: Family Medicine

## 2017-04-02 VITALS — BP 162/96 | Ht 65.0 in | Wt 166.0 lb

## 2017-04-02 DIAGNOSIS — I10 Essential (primary) hypertension: Secondary | ICD-10-CM

## 2017-04-02 MED ORDER — CLONIDINE HCL 0.1 MG PO TABS
0.1000 mg | ORAL_TABLET | Freq: Once | ORAL | Status: AC
  Administered 2017-04-02: 0.1 mg via ORAL

## 2017-04-02 MED ORDER — LOSARTAN POTASSIUM 50 MG PO TABS: 50.0000 mg | ORAL_TABLET | Freq: Every day | ORAL | 0 refills | Status: DC

## 2017-04-02 MED ORDER — BLOOD PRESSURE MONITORING DEVI: 1.0000 | Freq: Two times a day (BID) | 0 refills | Status: DC

## 2017-04-02 NOTE — Patient Instructions (Addendum)
Will start a trial of Losartan 50 mg daily.  Losartan tablets What is this medicine? LOSARTAN (loe SAR tan) is used to treat high blood pressure and to reduce the risk of stroke in certain patients. This drug also slows the progression of kidney disease in patients with diabetes. This medicine may be used for other purposes; ask your health care provider or pharmacist if you have questions. COMMON BRAND NAME(S): Cozaar What should I tell my health care provider before I take this medicine? They need to know if you have any of these conditions: -heart failure -kidney or liver disease -an unusual or allergic reaction to losartan, other medicines, foods, dyes, or preservatives -pregnant or trying to get pregnant -breast-feeding How should I use this medicine? Take this medicine by mouth with a glass of water. Follow the directions on the prescription label. This medicine can be taken with or without food. Take your doses at regular intervals. Do not take your medicine more often than directed. Talk to your pediatrician regarding the use of this medicine in children. Special care may be needed. Overdosage: If you think you have taken too much of this medicine contact a poison control center or emergency room at once. NOTE: This medicine is only for you. Do not share this medicine with others. What if I miss a dose? If you miss a dose, take it as soon as you can. If it is almost time for your next dose, take only that dose. Do not take double or extra doses. What may interact with this medicine? -blood pressure medicines -diuretics, especially triamterene, spironolactone, or amiloride -fluconazole -NSAIDs, medicines for pain and inflammation, like ibuprofen or naproxen -potassium salts or potassium supplements -rifampin This list may not describe all possible interactions. Give your health care provider a list of all the medicines, herbs, non-prescription drugs, or dietary supplements you use.  Also tell them if you smoke, drink alcohol, or use illegal drugs. Some items may interact with your medicine. What should I watch for while using this medicine? Visit your doctor or health care professional for regular checks on your progress. Check your blood pressure as directed. Ask your doctor or health care professional what your blood pressure should be and when you should contact him or her. Call your doctor or health care professional if you notice an irregular or fast heart beat. Women should inform their doctor if they wish to become pregnant or think they might be pregnant. There is a potential for serious side effects to an unborn child, particularly in the second or third trimester. Talk to your health care professional or pharmacist for more information. You may get drowsy or dizzy. Do not drive, use machinery, or do anything that needs mental alertness until you know how this drug affects you. Do not stand or sit up quickly, especially if you are an older patient. This reduces the risk of dizzy or fainting spells. Alcohol can make you more drowsy and dizzy. Avoid alcoholic drinks. Avoid salt substitutes unless you are told otherwise by your doctor or health care professional. Do not treat yourself for coughs, colds, or pain while you are taking this medicine without asking your doctor or health care professional for advice. Some ingredients may increase your blood pressure. What side effects may I notice from receiving this medicine? Side effects that you should report to your doctor or health care professional as soon as possible: -confusion, dizziness, light headedness or fainting spells -decreased amount of urine passed -difficulty breathing  or swallowing, hoarseness, or tightening of the throat -fast or irregular heart beat, palpitations, or chest pain -skin rash, itching -swelling of your face, lips, tongue, hands, or feet Side effects that usually do not require medical attention  (report to your doctor or health care professional if they continue or are bothersome): -cough -decreased sexual function or desire -headache -nasal congestion or stuffiness -nausea or stomach pain -sore or cramping muscles This list may not describe all possible side effects. Call your doctor for medical advice about side effects. You may report side effects to FDA at 1-800-FDA-1088. Where should I keep my medicine? Keep out of the reach of children. Store at room temperature between 15 and 30 degrees C (59 and 86 degrees F). Protect from light. Keep container tightly closed. Throw away any unused medicine after the expiration date. NOTE: This sheet is a summary. It may not cover all possible information. If you have questions about this medicine, talk to your doctor, pharmacist, or health care provider.  2018 Elsevier/Gold Standard (2007-10-31 16:42:18)  Hypertension Hypertension, commonly called high blood pressure, is when the force of blood pumping through the arteries is too strong. The arteries are the blood vessels that carry blood from the heart throughout the body. Hypertension forces the heart to work harder to pump blood and may cause arteries to become narrow or stiff. Having untreated or uncontrolled hypertension can cause heart attacks, strokes, kidney disease, and other problems. A blood pressure reading consists of a higher number over a lower number. Ideally, your blood pressure should be below 120/80. The first ("top") number is called the systolic pressure. It is a measure of the pressure in your arteries as your heart beats. The second ("bottom") number is called the diastolic pressure. It is a measure of the pressure in your arteries as the heart relaxes. What are the causes? The cause of this condition is not known. What increases the risk? Some risk factors for high blood pressure are under your control. Others are not. Factors you can change  Smoking.  Having type 2  diabetes mellitus, high cholesterol, or both.  Not getting enough exercise or physical activity.  Being overweight.  Having too much fat, sugar, calories, or salt (sodium) in your diet.  Drinking too much alcohol. Factors that are difficult or impossible to change  Having chronic kidney disease.  Having a family history of high blood pressure.  Age. Risk increases with age.  Race. You may be at higher risk if you are African-American.  Gender. Men are at higher risk than women before age 46. After age 43, women are at higher risk than men.  Having obstructive sleep apnea.  Stress. What are the signs or symptoms? Extremely high blood pressure (hypertensive crisis) may cause:  Headache.  Anxiety.  Shortness of breath.  Nosebleed.  Nausea and vomiting.  Severe chest pain.  Jerky movements you cannot control (seizures).  How is this diagnosed? This condition is diagnosed by measuring your blood pressure while you are seated, with your arm resting on a surface. The cuff of the blood pressure monitor will be placed directly against the skin of your upper arm at the level of your heart. It should be measured at least twice using the same arm. Certain conditions can cause a difference in blood pressure between your right and left arms. Certain factors can cause blood pressure readings to be lower or higher than normal (elevated) for a short period of time:  When your blood pressure  is higher when you are in a health care provider's office than when you are at home, this is called white coat hypertension. Most people with this condition do not need medicines.  When your blood pressure is higher at home than when you are in a health care provider's office, this is called masked hypertension. Most people with this condition may need medicines to control blood pressure.  If you have a high blood pressure reading during one visit or you have normal blood pressure with other risk  factors:  You may be asked to return on a different day to have your blood pressure checked again.  You may be asked to monitor your blood pressure at home for 1 week or longer.  If you are diagnosed with hypertension, you may have other blood or imaging tests to help your health care provider understand your overall risk for other conditions. How is this treated? This condition is treated by making healthy lifestyle changes, such as eating healthy foods, exercising more, and reducing your alcohol intake. Your health care provider may prescribe medicine if lifestyle changes are not enough to get your blood pressure under control, and if:  Your systolic blood pressure is above 130.  Your diastolic blood pressure is above 80.  Your personal target blood pressure may vary depending on your medical conditions, your age, and other factors. Follow these instructions at home: Eating and drinking  Eat a diet that is high in fiber and potassium, and low in sodium, added sugar, and fat. An example eating plan is called the DASH (Dietary Approaches to Stop Hypertension) diet. To eat this way: ? Eat plenty of fresh fruits and vegetables. Try to fill half of your plate at each meal with fruits and vegetables. ? Eat whole grains, such as whole wheat pasta, brown rice, or whole grain bread. Fill about one quarter of your plate with whole grains. ? Eat or drink low-fat dairy products, such as skim milk or low-fat yogurt. ? Avoid fatty cuts of meat, processed or cured meats, and poultry with skin. Fill about one quarter of your plate with lean proteins, such as fish, chicken without skin, beans, eggs, and tofu. ? Avoid premade and processed foods. These tend to be higher in sodium, added sugar, and fat.  Reduce your daily sodium intake. Most people with hypertension should eat less than 1,500 mg of sodium a day.  Limit alcohol intake to no more than 1 drink a day for nonpregnant women and 2 drinks a day  for men. One drink equals 12 oz of beer, 5 oz of wine, or 1 oz of hard liquor. Lifestyle  Work with your health care provider to maintain a healthy body weight or to lose weight. Ask what an ideal weight is for you.  Get at least 30 minutes of exercise that causes your heart to beat faster (aerobic exercise) most days of the week. Activities may include walking, swimming, or biking.  Include exercise to strengthen your muscles (resistance exercise), such as pilates or lifting weights, as part of your weekly exercise routine. Try to do these types of exercises for 30 minutes at least 3 days a week.  Do not use any products that contain nicotine or tobacco, such as cigarettes and e-cigarettes. If you need help quitting, ask your health care provider.  Monitor your blood pressure at home as told by your health care provider.  Keep all follow-up visits as told by your health care provider. This is important.  Medicines  Take over-the-counter and prescription medicines only as told by your health care provider. Follow directions carefully. Blood pressure medicines must be taken as prescribed.  Do not skip doses of blood pressure medicine. Doing this puts you at risk for problems and can make the medicine less effective.  Ask your health care provider about side effects or reactions to medicines that you should watch for. Contact a health care provider if:  You think you are having a reaction to a medicine you are taking.  You have headaches that keep coming back (recurring).  You feel dizzy.  You have swelling in your ankles.  You have trouble with your vision. Get help right away if:  You develop a severe headache or confusion.  You have unusual weakness or numbness.  You feel faint.  You have severe pain in your chest or abdomen.  You vomit repeatedly.  You have trouble breathing. Summary  Hypertension is when the force of blood pumping through your arteries is too strong.  If this condition is not controlled, it may put you at risk for serious complications.  Your personal target blood pressure may vary depending on your medical conditions, your age, and other factors. For most people, a normal blood pressure is less than 120/80.  Hypertension is treated with lifestyle changes, medicines, or a combination of both. Lifestyle changes include weight loss, eating a healthy, low-sodium diet, exercising more, and limiting alcohol. This information is not intended to replace advice given to you by your health care provider. Make sure you discuss any questions you have with your health care provider. Document Released: 08/20/2005 Document Revised: 07/18/2016 Document Reviewed: 07/18/2016 Elsevier Interactive Patient Education  [employer].

## 2017-04-02 NOTE — Progress Notes (Signed)
Subjective:    Margaret Kelly, a 66 year old female that presents for a follow up of hypertension. She was started on Amlodipine 5 mg 2 weeks ago. She does not exercise routinely. She follows a low sodium diet and does not eat processed foods. Patient denies: chest pain, chest pressure/discomfort, claudication, dyspnea, lower extremity edema and palpitations. Cardiovascular risk factors: dyslipidemia, sedentary lifestyle and smoking/ tobacco exposure.   Past Medical History:  Diagnosis Date  . Anxiety   . Blood transfusion without reported diagnosis 1982  . GERD (gastroesophageal reflux disease)   . Hepatitis C   . Hx of hepatitis C 2010  . Hyperlipidemia   . Hypertension   . Nerve damage 2001   left side as result of fall   Immunization History  Administered Date(s) Administered  . Pneumococcal Polysaccharide-23 09/13/2016  . Tdap 09/13/2016   Social History   Social History  . Marital status: Divorced    Spouse name: N/A  . Number of children: N/A  . Years of education: N/A   Occupational History  . Not on file.   Social History Main Topics  . Smoking status: Former Smoker    Packs/day: 0.50    Types: Cigarettes  . Smokeless tobacco: Current User     Comment: using patch.   . Alcohol use Yes     Comment: rare  . Drug use: No  . Sexual activity: Not on file   Other Topics Concern  . Not on file   Social History Narrative  . No narrative on file      Review of Systems Review of Systems  Constitutional: Negative.   HENT: Negative.   Respiratory: Negative.   Cardiovascular: Negative.  Negative for chest pain, palpitations and orthopnea.  Gastrointestinal: Negative.   Genitourinary: Negative.   Musculoskeletal: Negative.   Skin: Negative.   Neurological: Negative.   Psychiatric/Behavioral: Negative.      Objective:    BP (!) 180/96 Comment: manual  Ht 5\' 5"  (1.651 m)   Wt 166 lb (75.3 kg)   BMI 27.62 kg/m   General Appearance:    Alert,  cooperative, no distress, appears stated age  Head:    Normocephalic, without obvious abnormality, atraumatic  Eyes:    PERRL, conjunctiva/corneas clear, EOM's intact, fundi    benign, both eyes  Ears:    Normal TM's and external ear canals, both ears  Nose:   Nares normal, septum midline, mucosa normal, no drainage    or sinus tenderness  Throat:   Lips, mucosa, and tongue normal; teeth and gums normal  Neck:   Supple, symmetrical, trachea midline, no adenopathy;    thyroid:  no enlargement/tenderness/nodules; no carotid   bruit or JVD  Back:     Symmetric, no curvature, ROM normal, no CVA tenderness  Lungs:     Clear to auscultation bilaterally, respirations unlabored  Chest Wall:    No tenderness or deformity   Heart:    Regular rate and rhythm, S1 and S2 normal, no murmur, rub   or gallop  Abdomen:     Soft, non-tender, bowel sounds active all four quadrants,    no masses, no organomegaly  Extremities:   Extremities normal, atraumatic, no cyanosis or edema  Pulses:   2+ and symmetric all extremities  Skin:   Skin color, texture, turgor normal, no rashes or lesions  Lymph nodes:   Cervical, supraclavicular, and axillary nodes normal  Neurologic:   CNII-XII intact, normal strength, sensation and reflexes  throughout      Assessment:      Plan:  BP (!) 180/96 Comment: manual  Ht 5\' 5"  (1.651 m)   Wt 166 lb (75.3 kg)   BMI 27.62 kg/m   1. Accelerated hypertension Blood pressure decreased to 162/96 following Clonidine 0.2 mg.  Reviewed EKG, normal sinus rhythm.  Will start a trial of Losartan 50 mg.  Recommend DASH diet.  - losartan (COZAAR) 50 MG tablet; Take 1 tablet (50 mg total) by mouth daily.  Dispense: 30 tablet; Refill: 0 - cloNIDine (CATAPRES) tablet 0.1 mg; Take 1 tablet (0.1 mg total) by mouth once. - Thyroid Panel With TSH - EKG 12-Lead - Blood Pressure Monitoring DEVI; 1 each by Does not apply route 2 (two) times daily.  Dispense: 1 Device; Refill: 0    Dietary sodium restriction. Regular aerobic exercise. Check blood pressures 2 times daily and record.     RTC: 1 week for bp check. 1 month for hypertension   [name]  MSN, FNP-C [facility] [address] [place], [place] [address] [phone]

## 2017-04-03 LAB — THYROID PANEL WITH TSH
Free Thyroxine Index: 2.3 (ref 1.4–3.8)
T3 UPTAKE: 29 % (ref 22–35)
T4, Total: 8.1 ug/dL (ref 4.5–12.0)
TSH: 0.85 mIU/L

## 2017-04-08 ENCOUNTER — Other Ambulatory Visit: Payer: Self-pay | Admitting: Family Medicine

## 2017-04-08 ENCOUNTER — Ambulatory Visit: Payer: Medicaid Other

## 2017-04-08 VITALS — BP 164/84

## 2017-04-08 DIAGNOSIS — I10 Essential (primary) hypertension: Secondary | ICD-10-CM

## 2017-04-08 MED ORDER — HYDROCHLOROTHIAZIDE 12.5 MG PO TABS: 12.5000 mg | ORAL_TABLET | Freq: Every day | ORAL | 0 refills | Status: DC

## 2017-04-08 NOTE — Progress Notes (Signed)
Margaret Kelly, a 66 year old female with a history of accelerated hypertension. Blood pressure continues to be elevated, will add a thiazide diuretic to medication regimen.   Will recheck blood pressure in 1 week.    [name]  MSN, FNP-C [facility] [address] [place], [place] [address] [phone]

## 2017-04-17 ENCOUNTER — Ambulatory Visit: Payer: Medicaid Other

## 2017-04-17 VITALS — BP 148/82

## 2017-04-17 DIAGNOSIS — I10 Essential (primary) hypertension: Secondary | ICD-10-CM

## 2017-04-27 ENCOUNTER — Other Ambulatory Visit: Payer: Self-pay | Admitting: Family Medicine

## 2017-04-27 DIAGNOSIS — I10 Essential (primary) hypertension: Secondary | ICD-10-CM

## 2017-05-02 ENCOUNTER — Other Ambulatory Visit: Payer: Self-pay | Admitting: Family Medicine

## 2017-05-02 DIAGNOSIS — I10 Essential (primary) hypertension: Secondary | ICD-10-CM

## 2017-05-09 ENCOUNTER — Other Ambulatory Visit: Payer: Self-pay | Admitting: Family Medicine

## 2017-05-09 DIAGNOSIS — K219 Gastro-esophageal reflux disease without esophagitis: Secondary | ICD-10-CM

## 2017-05-13 ENCOUNTER — Encounter: Payer: Self-pay | Admitting: Family Medicine

## 2017-05-13 ENCOUNTER — Ambulatory Visit (INDEPENDENT_AMBULATORY_CARE_PROVIDER_SITE_OTHER): Payer: Medicaid Other | Admitting: Family Medicine

## 2017-05-13 VITALS — BP 138/72 | HR 74 | Temp 98.3°F | Resp 16 | Ht 65.0 in | Wt 165.0 lb

## 2017-05-13 DIAGNOSIS — I10 Essential (primary) hypertension: Secondary | ICD-10-CM | POA: Diagnosis not present

## 2017-05-13 MED ORDER — LOSARTAN POTASSIUM 50 MG PO TABS: ORAL_TABLET | ORAL | 1 refills | Status: DC

## 2017-05-13 MED ORDER — HYDROCHLOROTHIAZIDE 12.5 MG PO TABS: 25.0000 mg | ORAL_TABLET | Freq: Every day | ORAL | 1 refills | Status: DC

## 2017-05-13 NOTE — Progress Notes (Signed)
Subjective:    Patient ID: Margaret Kelly, female    DOB: [birth year], 66 y.o.   MRN: [id number]  Mr. Margaret Kelly, a 66 year old female with a history of hypertension presents for a 1 month follow up after adding anti-hypertensive to medications.    Hypertension  This is a chronic problem. The current episode started more than 1 year ago. Pertinent negatives include no anxiety, blurred vision, chest pain, headaches, malaise/fatigue, neck pain, orthopnea, palpitations, peripheral edema, PND, shortness of breath or sweats. Risk factors for coronary artery disease include dyslipidemia and family history. Past treatments include lifestyle changes. There is no history of hypercortisolism, hyperparathyroidism, a hypertension causing med or a thyroid problem.     Past Medical History:  Diagnosis Date  . Anxiety   . Blood transfusion without reported diagnosis 1982  . GERD (gastroesophageal reflux disease)   . Hepatitis C   . Hx of hepatitis C 2010  . Hyperlipidemia   . Hypertension   . Nerve damage 2001   left side as result of fall   Past Surgical History:  Procedure Laterality Date  . ABDOMINAL HYSTERECTOMY  1982  . CARPAL TUNNEL RELEASE Left 2005  . foot spur Left 2007   foot   Allergies  Allergen Reactions  . Amlodipine Other (See Comments)    Ringing in ears    Immunization History  Administered Date(s) Administered  . Pneumococcal Polysaccharide-23 09/13/2016  . Tdap 09/13/2016   Review of Systems  Constitutional: Negative for fatigue, malaise/fatigue and unexpected weight change.  Eyes: Negative.  Negative for blurred vision.  Respiratory: Negative for chest tightness and shortness of breath.   Cardiovascular: Negative.  Negative for chest pain, palpitations, orthopnea and PND.  Gastrointestinal: Negative.   Endocrine: Negative.  Negative for polydipsia and polyphagia.  Genitourinary: Negative.   Musculoskeletal: Negative.  Negative for neck pain.  Skin:  Negative.   Neurological: Negative for numbness and headaches.  Hematological: Negative.   Psychiatric/Behavioral: Negative.        Objective:   Physical Exam  Constitutional: She appears well-developed and well-nourished. She has a sickly appearance.  HENT:  Right Ear: Hearing normal.  Left Ear: Hearing normal.  Eyes: Pupils are equal, round, and reactive to light. Conjunctivae, EOM and lids are normal.  Neck: Trachea normal and normal range of motion.  Cardiovascular: Normal rate and regular rhythm.   Pulmonary/Chest: Effort normal and breath sounds normal. She has no decreased breath sounds.  Abdominal: Soft. Normal appearance. There is no tenderness.  Lymphadenopathy:       Head (right side): No submental, no submandibular, no tonsillar and no preauricular adenopathy present.       Head (left side): No submental, no submandibular, no tonsillar and no preauricular adenopathy present.  Neurological: She is alert. No cranial nerve deficit or sensory deficit. She exhibits normal muscle tone.  Skin: Skin is warm, dry and intact.  Psychiatric: She has a normal mood and affect. Her speech is normal and behavior is normal. Judgment and thought content normal.        BP (!) 142/78 (BP Location: Left Arm, Patient Position: Sitting, Cuff Size: Small) Comment: manually  Pulse 74   Temp 98.3 F (36.8 C) (Oral)   Resp 16   Ht 5\' 5"  (1.651 m)   Wt 165 lb (74.8 kg)   SpO2 97%   BMI 27.46 kg/m  Assessment & Plan:  Essential hypertension Blood pressure has improved on current medication regimen. Will continue current medication  regimen.  Continue medication, monitor blood pressure at home. Continue DASH diet. Reminder to go to the ER if any CP, SOB, nausea, dizziness, severe HA, changes vision/speech, left arm numbness and tingling and jaw pain.    - losartan (COZAAR) 50 MG tablet; TAKE 1 TABLET(50 MG) BY MOUTH DAILY  Dispense: 90 tablet; Refill: 1 - hydrochlorothiazide (HYDRODIURIL)  12.5 MG tablet; Take 2 tablets (25 mg total) by mouth daily.  Dispense: 90 tablet; Refill: [place]  MSN, FNP-C Patient [facility] [address] [place], [place] [address] [phone]

## 2017-05-13 NOTE — Patient Instructions (Signed)
DASH Eating Plan DASH stands for "Dietary Approaches to Stop Hypertension." The DASH eating plan is a healthy eating plan that has been shown to reduce high blood pressure (hypertension). It may also reduce your risk for type 2 diabetes, heart disease, and stroke. The DASH eating plan may also help with weight loss. What are tips for following this plan? General guidelines  Avoid eating more than 2,300 mg (milligrams) of salt (sodium) a day. If you have hypertension, you may need to reduce your sodium intake to 1,500 mg a day.  Limit alcohol intake to no more than 1 drink a day for nonpregnant women and 2 drinks a day for men. One drink equals 12 oz of beer, 5 oz of wine, or 1 oz of hard liquor.  Work with your health care provider to maintain a healthy body weight or to lose weight. Ask what an ideal weight is for you.  Get at least 30 minutes of exercise that causes your heart to beat faster (aerobic exercise) most days of the week. Activities may include walking, swimming, or biking.  Work with your health care provider or diet and nutrition specialist (dietitian) to adjust your eating plan to your individual calorie needs. Reading food labels  Check food labels for the amount of sodium per serving. Choose foods with less than 5 percent of the Daily Value of sodium. Generally, foods with less than 300 mg of sodium per serving fit into this eating plan.  To find whole grains, look for the word "whole" as the first word in the ingredient list. Shopping  Buy products labeled as "low-sodium" or "no salt added."  Buy fresh foods. Avoid canned foods and premade or frozen meals. Cooking  Avoid adding salt when cooking. Use salt-free seasonings or herbs instead of table salt or sea salt. Check with your health care provider or pharmacist before using salt substitutes.  Do not fry foods. Cook foods using healthy methods such as baking, boiling, grilling, and broiling instead.  Cook with  heart-healthy oils, such as olive, canola, soybean, or sunflower oil. Meal planning   Eat a balanced diet that includes: ? 5 or more servings of fruits and vegetables each day. At each meal, try to fill half of your plate with fruits and vegetables. ? Up to 6-8 servings of whole grains each day. ? Less than 6 oz of lean meat, poultry, or fish each day. A 3-oz serving of meat is about the same size as a deck of cards. One egg equals 1 oz. ? 2 servings of low-fat dairy each day. ? A serving of nuts, seeds, or beans 5 times each week. ? Heart-healthy fats. Healthy fats called Omega-3 fatty acids are found in foods such as flaxseeds and coldwater fish, like sardines, salmon, and mackerel.  Limit how much you eat of the following: ? Canned or prepackaged foods. ? Food that is high in trans fat, such as fried foods. ? Food that is high in saturated fat, such as fatty meat. ? Sweets, desserts, sugary drinks, and other foods with added sugar. ? Full-fat dairy products.  Do not salt foods before eating.  Try to eat at least 2 vegetarian meals each week.  Eat more home-cooked food and less restaurant, buffet, and fast food.  When eating at a restaurant, ask that your food be prepared with less salt or no salt, if possible. What foods are recommended? The items listed may not be a complete list. Talk with your dietitian about what   dietary choices are best for you. Grains Whole-grain or whole-wheat bread. Whole-grain or whole-wheat pasta. Brown rice. Oatmeal. Quinoa. Bulgur. Whole-grain and low-sodium cereals. Pita bread. Low-fat, low-sodium crackers. Whole-wheat flour tortillas. Vegetables Fresh or frozen vegetables (raw, steamed, roasted, or grilled). Low-sodium or reduced-sodium tomato and vegetable juice. Low-sodium or reduced-sodium tomato sauce and tomato paste. Low-sodium or reduced-sodium canned vegetables. Fruits All fresh, dried, or frozen fruit. Canned fruit in natural juice (without  added sugar). Meat and other protein foods Skinless chicken or turkey. Ground chicken or turkey. Pork with fat trimmed off. Fish and seafood. Egg whites. Dried beans, peas, or lentils. Unsalted nuts, nut butters, and seeds. Unsalted canned beans. Lean cuts of beef with fat trimmed off. Low-sodium, lean deli meat. Dairy Low-fat (1%) or fat-free (skim) milk. Fat-free, low-fat, or reduced-fat cheeses. Nonfat, low-sodium ricotta or cottage cheese. Low-fat or nonfat yogurt. Low-fat, low-sodium cheese. Fats and oils Soft margarine without trans fats. Vegetable oil. Low-fat, reduced-fat, or light mayonnaise and salad dressings (reduced-sodium). Canola, safflower, olive, soybean, and sunflower oils. Avocado. Seasoning and other foods Herbs. Spices. Seasoning mixes without salt. Unsalted popcorn and pretzels. Fat-free sweets. What foods are not recommended? The items listed may not be a complete list. Talk with your dietitian about what dietary choices are best for you. Grains Baked goods made with fat, such as croissants, muffins, or some breads. Dry pasta or rice meal packs. Vegetables Creamed or fried vegetables. Vegetables in a cheese sauce. Regular canned vegetables (not low-sodium or reduced-sodium). Regular canned tomato sauce and paste (not low-sodium or reduced-sodium). Regular tomato and vegetable juice (not low-sodium or reduced-sodium). Pickles. Olives. Fruits Canned fruit in a light or heavy syrup. Fried fruit. Fruit in cream or butter sauce. Meat and other protein foods Fatty cuts of meat. Ribs. Fried meat. Bacon. Sausage. Bologna and other processed lunch meats. Salami. Fatback. Hotdogs. Bratwurst. Salted nuts and seeds. Canned beans with added salt. Canned or smoked fish. Whole eggs or egg yolks. Chicken or turkey with skin. Dairy Whole or 2% milk, cream, and half-and-half. Whole or full-fat cream cheese. Whole-fat or sweetened yogurt. Full-fat cheese. Nondairy creamers. Whipped toppings.  Processed cheese and cheese spreads. Fats and oils Butter. Stick margarine. Lard. Shortening. Ghee. Bacon fat. Tropical oils, such as coconut, palm kernel, or palm oil. Seasoning and other foods Salted popcorn and pretzels. Onion salt, garlic salt, seasoned salt, table salt, and sea salt. Worcestershire sauce. Tartar sauce. Barbecue sauce. Teriyaki sauce. Soy sauce, including reduced-sodium. Steak sauce. Canned and packaged gravies. Fish sauce. Oyster sauce. Cocktail sauce. Horseradish that you find on the shelf. Ketchup. Mustard. Meat flavorings and tenderizers. Bouillon cubes. Hot sauce and Tabasco sauce. Premade or packaged marinades. Premade or packaged taco seasonings. Relishes. Regular salad dressings. Where to find more information:  National Heart, Lung, and Blood Institute: www.nhlbi.nih.gov  American Heart Association: www.heart.org Summary  The DASH eating plan is a healthy eating plan that has been shown to reduce high blood pressure (hypertension). It may also reduce your risk for type 2 diabetes, heart disease, and stroke.  With the DASH eating plan, you should limit salt (sodium) intake to 2,300 mg a day. If you have hypertension, you may need to reduce your sodium intake to 1,500 mg a day.  When on the DASH eating plan, aim to eat more fresh fruits and vegetables, whole grains, lean proteins, low-fat dairy, and heart-healthy fats.  Work with your health care provider or diet and nutrition specialist (dietitian) to adjust your eating plan to your individual   calorie needs. This information is not intended to replace advice given to you by your health care provider. Make sure you discuss any questions you have with your health care provider. Document Released: 08/09/2011 Document Revised: 08/13/2016 Document Reviewed: 08/13/2016 Elsevier Interactive Patient Education  2017 Elsevier Inc.  

## 2017-05-28 ENCOUNTER — Other Ambulatory Visit: Payer: Self-pay | Admitting: Family Medicine

## 2017-05-28 DIAGNOSIS — I10 Essential (primary) hypertension: Secondary | ICD-10-CM

## 2017-06-03 ENCOUNTER — Other Ambulatory Visit: Payer: Self-pay | Admitting: Family Medicine

## 2017-06-03 DIAGNOSIS — K219 Gastro-esophageal reflux disease without esophagitis: Secondary | ICD-10-CM

## 2017-06-24 ENCOUNTER — Ambulatory Visit: Payer: Self-pay | Admitting: Family Medicine

## 2017-07-09 ENCOUNTER — Other Ambulatory Visit: Payer: Self-pay | Admitting: Family Medicine

## 2017-07-09 DIAGNOSIS — K219 Gastro-esophageal reflux disease without esophagitis: Secondary | ICD-10-CM

## 2017-07-10 ENCOUNTER — Other Ambulatory Visit: Payer: Self-pay | Admitting: Family Medicine

## 2017-07-10 DIAGNOSIS — I10 Essential (primary) hypertension: Secondary | ICD-10-CM

## 2017-08-04 ENCOUNTER — Other Ambulatory Visit: Payer: Self-pay | Admitting: Family Medicine

## 2017-08-04 DIAGNOSIS — K219 Gastro-esophageal reflux disease without esophagitis: Secondary | ICD-10-CM

## 2017-08-12 ENCOUNTER — Ambulatory Visit: Payer: Self-pay | Admitting: Family Medicine

## 2017-08-21 ENCOUNTER — Encounter: Payer: Self-pay | Admitting: Family Medicine

## 2017-08-21 ENCOUNTER — Ambulatory Visit: Payer: Medicaid Other | Admitting: Family Medicine

## 2017-08-21 VITALS — BP 144/88 | HR 87 | Temp 98.4°F | Resp 16 | Ht 65.0 in | Wt 164.0 lb

## 2017-08-21 DIAGNOSIS — I10 Essential (primary) hypertension: Secondary | ICD-10-CM

## 2017-08-21 DIAGNOSIS — E785 Hyperlipidemia, unspecified: Secondary | ICD-10-CM | POA: Diagnosis not present

## 2017-08-21 DIAGNOSIS — R05 Cough: Secondary | ICD-10-CM | POA: Diagnosis not present

## 2017-08-21 DIAGNOSIS — R059 Cough, unspecified: Secondary | ICD-10-CM

## 2017-08-21 LAB — POCT URINALYSIS DIP (DEVICE)
BILIRUBIN URINE: NEGATIVE
GLUCOSE, UA: NEGATIVE mg/dL
KETONES UR: NEGATIVE mg/dL
LEUKOCYTES UA: NEGATIVE
Nitrite: NEGATIVE
Protein, ur: NEGATIVE mg/dL
SPECIFIC GRAVITY, URINE: 1.02 (ref 1.005–1.030)
Urobilinogen, UA: 0.2 mg/dL (ref 0.0–1.0)
pH: 7 (ref 5.0–8.0)

## 2017-08-21 MED ORDER — BENZONATATE 100 MG PO CAPS: 100.0000 mg | ORAL_CAPSULE | Freq: Three times a day (TID) | ORAL | 0 refills | Status: DC | PRN

## 2017-08-21 NOTE — Patient Instructions (Addendum)
Will follow up by phone with any abnormal laboratory results.    Benzonatate capsules What is this medicine? BENZONATATE (ben ZOE na tate) is used to treat cough. This medicine may be used for other purposes; ask your health care provider or pharmacist if you have questions. COMMON BRAND NAME(S): Tessalon Perles, Zonatuss What should I tell my health care provider before I take this medicine? They need to know if you have any of these conditions: -kidney or liver disease -an unusual or allergic reaction to benzonatate, anesthetics, other medicines, foods, dyes, or preservatives -pregnant or trying to get pregnant -breast-feeding How should I use this medicine? Take this medicine by mouth with a glass of water. Follow the directions on the prescription label. Avoid breaking, chewing, or sucking the capsule, as this can cause serious side effects. Take your medicine at regular intervals. Do not take your medicine more often than directed. Talk to your pediatrician regarding the use of this medicine in children. While this drug may be prescribed for children as young as 36 years old for selected conditions, precautions do apply. Overdosage: If you think you have taken too much of this medicine contact a poison control center or emergency room at once. NOTE: This medicine is only for you. Do not share this medicine with others. What if I miss a dose? If you miss a dose, take it as soon as you can. If it is almost time for your next dose, take only that dose. Do not take double or extra doses. What may interact with this medicine? Do not take this medicine with any of the following medications: -MAOIs like Carbex, Eldepryl, Marplan, Nardil, and Parnate This list may not describe all possible interactions. Give your health care provider a list of all the medicines, herbs, non-prescription drugs, or dietary supplements you use. Also tell them if you smoke, drink alcohol, or use illegal drugs. Some  items may interact with your medicine. What should I watch for while using this medicine? Tell your doctor if your symptoms do not improve or if they get worse. If you have a high fever, skin rash, or headache, see your health care professional. You may get drowsy or dizzy. Do not drive, use machinery, or do anything that needs mental alertness until you know how this medicine affects you. Do not sit or stand up quickly, especially if you are an older patient. This reduces the risk of dizzy or fainting spells. What side effects may I notice from receiving this medicine? Side effects that you should report to your doctor or health care professional as soon as possible: -allergic reactions like skin rash, itching or hives, swelling of the face, lips, or tongue -breathing problems -chest pain -confusion or hallucinations -irregular heartbeat -numbness of mouth or throat -seizures Side effects that usually do not require medical attention (report to your doctor or health care professional if they continue or are bothersome): -burning feeling in the eyes -constipation -headache -nasal congestion -stomach upset This list may not describe all possible side effects. Call your doctor for medical advice about side effects. You may report side effects to FDA at 1-800-FDA-1088. Where should I keep my medicine? Keep out of the reach of children. Store at room temperature between 15 and 30 degrees C (59 and 86 degrees F). Keep tightly closed. Protect from light and moisture. Throw away any unused medicine after the expiration date. NOTE: This sheet is a summary. It may not cover all possible information. If you have questions  about this medicine, talk to your doctor, pharmacist, or health care provider.  2018 Elsevier/Gold Standard (2007-11-19 14:52:56) Cough, Adult A cough helps to clear your throat and lungs. A cough may last only 2-3 weeks (acute), or it may last longer than 8 weeks (chronic). Many  different things can cause a cough. A cough may be a sign of an illness or another medical condition. Follow these instructions at home:  Pay attention to any changes in your cough.  Take medicines only as told by your doctor. ? If you were prescribed an antibiotic medicine, take it as told by your doctor. Do not stop taking it even if you start to feel better. ? Talk with your doctor before you try using a cough medicine.  Drink enough fluid to keep your pee (urine) clear or pale yellow.  If the air is dry, use a cold steam vaporizer or humidifier in your home.  Stay away from things that make you cough at work or at home.  If your cough is worse at night, try using extra pillows to raise your head up higher while you sleep.  Do not smoke, and try not to be around smoke. If you need help quitting, ask your doctor.  Do not have caffeine.  Do not drink alcohol.  Rest as needed. Contact a doctor if:  You have new problems (symptoms).  You cough up yellow fluid (pus).  Your cough does not get better after 2-3 weeks, or your cough gets worse.  Medicine does not help your cough and you are not sleeping well.  You have pain that gets worse or pain that is not helped with medicine.  You have a fever.  You are losing weight and you do not know why.  You have night sweats. Get help right away if:  You cough up blood.  You have trouble breathing.  Your heartbeat is very fast. This information is not intended to replace advice given to you by your health care provider. Make sure you discuss any questions you have with your health care provider. Document Released: 05/03/2011 Document Revised: 01/26/2016 Document Reviewed: 10/27/2014 Elsevier Interactive Patient Education  [employer].

## 2017-08-22 LAB — CMP AND LIVER
ALK PHOS: 47 IU/L (ref 39–117)
ALT: 21 IU/L (ref 0–32)
AST: 25 IU/L (ref 0–40)
Albumin: 4.8 g/dL (ref 3.6–4.8)
BUN: 14 mg/dL (ref 8–27)
Bilirubin Total: 0.4 mg/dL (ref 0.0–1.2)
Bilirubin, Direct: 0.13 mg/dL (ref 0.00–0.40)
CALCIUM: 10 mg/dL (ref 8.7–10.3)
CO2: 23 mmol/L (ref 20–29)
CREATININE: 0.72 mg/dL (ref 0.57–1.00)
Chloride: 102 mmol/L (ref 96–106)
GFR, EST AFRICAN AMERICAN: 102 mL/min/{1.73_m2} (ref >59)
GFR, EST NON AFRICAN AMERICAN: 89 mL/min/{1.73_m2} (ref >59)
GLUCOSE: 106 mg/dL — AB (ref 65–99)
Potassium: 3.7 mmol/L (ref 3.5–5.2)
SODIUM: 142 mmol/L (ref 134–144)
TOTAL PROTEIN: 7.3 g/dL (ref 6.0–8.5)

## 2017-08-27 NOTE — Progress Notes (Signed)
Subjective:   Chief Complaint  Patient presents with  . Hypertension  . Hyperlipidemia   Margaret Kelly, a 66 year old [place] female presents for a follow up of hypertension and hyperlipidemia presents for a 3 month follow up. Margaret Kelly says that she has been taking all prescribed medications consistently. She continues to follow a low fat, low sodium diet. She says that she grows her food and mostly eats organic products. She says that she has not been exercising as much since the weather has gotten colder. She does not check blood pressures at home.  She is a former smoker and has not smoked in greater than 6 months. She denies headache, dizziness, fatigue, syncope, heart palpitations, chest pain, shortness of breath or bilateral lower extremity edema.  Past Medical History:  Diagnosis Date  . Anxiety   . Blood transfusion without reported diagnosis 1982  . GERD (gastroesophageal reflux disease)   . Hepatitis C   . Hx of hepatitis C 2010  . Hyperlipidemia   . Hypertension   . Nerve damage 2001   left side as result of fall   Social History   Socioeconomic History  . Marital status: Divorced    Spouse name: Not on file  . Number of children: Not on file  . Years of education: Not on file  . Highest education level: Not on file  Social Needs  . Financial resource strain: Not on file  . Food insecurity - worry: Not on file  . Food insecurity - inability: Not on file  . Transportation needs - medical: Not on file  . Transportation needs - non-medical: Not on file  Occupational History  . Not on file  Tobacco Use  . Smoking status: Former Smoker    Packs/day: 0.50    Types: Cigarettes  . Smokeless tobacco: Current User  . Tobacco comment: using patch.   Substance and Sexual Activity  . Alcohol use: Yes    Comment: rare  . Drug use: No  . Sexual activity: Not on file  Other Topics Concern  . Not on file  Social History Narrative  . Not on file   Immunization  History  Administered Date(s) Administered  . Pneumococcal Polysaccharide-23 09/13/2016  . Tdap 09/13/2016   Allergies  Allergen Reactions  . Amlodipine Other (See Comments)    Ringing in ears   Review of Systems  Constitutional: Negative.   HENT: Negative.   Eyes: Negative.   Respiratory: Positive for cough. Negative for shortness of breath and wheezing.   Cardiovascular: Negative for chest pain, palpitations and leg swelling.  Gastrointestinal: Negative.   Endocrine: Negative.   Genitourinary: Negative.   Musculoskeletal: Negative.   Skin: Negative.   Allergic/Immunologic: Negative.   Neurological: Negative.   Hematological: Negative.      Objective:  Physical Exam  Constitutional: She is oriented to person, place, and time. She appears well-developed and well-nourished.  HENT:  Head: Normocephalic.  Eyes: Pupils are equal, round, and reactive to light.  Neck: Normal range of motion.  Cardiovascular: Normal rate, regular rhythm, normal heart sounds and intact distal pulses.  Pulmonary/Chest: Effort normal and breath sounds normal.  Abdominal: Soft. Bowel sounds are normal. She exhibits no distension. There is no tenderness.  Musculoskeletal: Normal range of motion.  Neurological: She is alert and oriented to person, place, and time.  Skin: Skin is warm and dry.  Psychiatric: She has a normal mood and affect. Her behavior is normal. Judgment and thought content normal.  Assessment:    Hypertension, stage 2    Plan:  BP (!) 144/88 Comment: manually  Pulse 87   Temp 98.4 F (36.9 C) (Oral)   Resp 16   Ht 5\' 5"  (1.651 m)   Wt 164 lb (74.4 kg)   SpO2 98%   BMI 27.29 kg/m  Essential hypertension Blood pressure has improved on current medication regimen.  We have discussed target BP range and blood pressure goal. I have advised patient to check BP regularly and to call us back or report to clinic if the numbers are consistently higher than 140/90. We discussed  the importance of compliance with medical therapy and DASH diet recommended, consequences of uncontrolled hypertension discussed.  - continue current BP medications - POCT urinalysis dip (device) - CMP and Liver  Cough in adult  - benzonatate (TESSALON PERLES) 100 MG capsule; Take 1 capsule (100 mg total) by mouth 3 (three) times daily as needed for cough.  Dispense: 30 capsule; Refill: 0   Hyperlipidemia LDL goal <100 The 10-year ASCVD risk score Margaret Kelly DC Jr., et al., 2013) is: 9.5%   Values used to calculate the score:     Age: 53 years     Sex: Female     Is Non-Hispanic African American: No     Diabetic: No     Tobacco smoker: No     Systolic Blood Pressure: 982 mmHg     Is BP treated: Yes     HDL Cholesterol: 59 mg/dL     Total Cholesterol: 263 mg/dL   RTC: 3 months for hypertension   Margaret Pounds  MSN, FNP-C [facility] [address] [place], [place] [address] [phone]

## 2017-08-31 ENCOUNTER — Other Ambulatory Visit: Payer: Self-pay | Admitting: Family Medicine

## 2017-08-31 DIAGNOSIS — K219 Gastro-esophageal reflux disease without esophagitis: Secondary | ICD-10-CM

## 2017-08-31 DIAGNOSIS — E785 Hyperlipidemia, unspecified: Secondary | ICD-10-CM

## 2017-09-04 ENCOUNTER — Other Ambulatory Visit: Payer: Self-pay | Admitting: Family Medicine

## 2017-09-04 DIAGNOSIS — M62838 Other muscle spasm: Secondary | ICD-10-CM

## 2017-09-09 ENCOUNTER — Other Ambulatory Visit: Payer: Self-pay | Admitting: Family Medicine

## 2017-09-09 DIAGNOSIS — Z1231 Encounter for screening mammogram for malignant neoplasm of breast: Secondary | ICD-10-CM

## 2017-09-27 ENCOUNTER — Other Ambulatory Visit: Payer: Self-pay | Admitting: Family Medicine

## 2017-09-27 DIAGNOSIS — I10 Essential (primary) hypertension: Secondary | ICD-10-CM

## 2017-09-27 DIAGNOSIS — K219 Gastro-esophageal reflux disease without esophagitis: Secondary | ICD-10-CM

## 2017-10-14 ENCOUNTER — Ambulatory Visit: Payer: Self-pay

## 2017-10-28 ENCOUNTER — Other Ambulatory Visit: Payer: Self-pay | Admitting: Family Medicine

## 2017-10-28 DIAGNOSIS — K219 Gastro-esophageal reflux disease without esophagitis: Secondary | ICD-10-CM

## 2017-10-28 DIAGNOSIS — I10 Essential (primary) hypertension: Secondary | ICD-10-CM

## 2017-10-31 ENCOUNTER — Ambulatory Visit: Payer: Self-pay

## 2017-11-12 ENCOUNTER — Ambulatory Visit
Admission: RE | Admit: 2017-11-12 | Discharge: 2017-11-12 | Disposition: A | Discharge status: Home / Self Care | Payer: Medicaid Other | Source: Ambulatory Visit | Attending: Family Medicine | Admitting: Family Medicine

## 2017-11-12 DIAGNOSIS — Z1231 Encounter for screening mammogram for malignant neoplasm of breast: Secondary | ICD-10-CM

## 2017-11-20 ENCOUNTER — Ambulatory Visit: Payer: Medicaid Other | Admitting: Family Medicine

## 2017-11-20 ENCOUNTER — Encounter: Payer: Self-pay | Admitting: Family Medicine

## 2017-11-20 VITALS — BP 148/80 | HR 86 | Temp 98.2°F | Resp 16 | Ht 65.0 in | Wt 162.0 lb

## 2017-11-20 DIAGNOSIS — E785 Hyperlipidemia, unspecified: Secondary | ICD-10-CM

## 2017-11-20 DIAGNOSIS — M545 Low back pain: Secondary | ICD-10-CM | POA: Diagnosis not present

## 2017-11-20 DIAGNOSIS — I10 Essential (primary) hypertension: Secondary | ICD-10-CM | POA: Diagnosis not present

## 2017-11-20 DIAGNOSIS — M62838 Other muscle spasm: Secondary | ICD-10-CM

## 2017-11-20 DIAGNOSIS — G8929 Other chronic pain: Secondary | ICD-10-CM | POA: Diagnosis not present

## 2017-11-20 DIAGNOSIS — Z720 Tobacco use: Secondary | ICD-10-CM

## 2017-11-20 LAB — POCT URINALYSIS DIP (DEVICE)
Bilirubin Urine: NEGATIVE
GLUCOSE, UA: NEGATIVE mg/dL
Hgb urine dipstick: NEGATIVE
KETONES UR: NEGATIVE mg/dL
Leukocytes, UA: NEGATIVE
NITRITE: NEGATIVE
PH: 7 (ref 5.0–8.0)
PROTEIN: NEGATIVE mg/dL
Specific Gravity, Urine: 1.02 (ref 1.005–1.030)
UROBILINOGEN UA: 0.2 mg/dL (ref 0.0–1.0)

## 2017-11-20 MED ORDER — LOSARTAN POTASSIUM 100 MG PO TABS: ORAL_TABLET | ORAL | 0 refills | Status: DC

## 2017-11-20 MED ORDER — CYCLOBENZAPRINE HCL 10 MG PO TABS: ORAL_TABLET | ORAL | 0 refills | Status: DC

## 2017-11-20 NOTE — Progress Notes (Signed)
Subjective:    Patient ID: Margaret Kelly, female    DOB: Dec 23, 1950, 67 y.o.   MRN: [id number]  HPI A 67 year old female with a history of hypertension, hyperlipidemia, hepatitis C, and chronic back pain presents for follow-up of chronic conditions.  Patient states that she has been taking all prescribed medications consistently.  She feels well and has minimal complaints.  Patient typically follows a balanced diet and has increased physical activity throughout the day.  She mostly works in her garden and is busy in the community throughout the week.  She currently denies headache, dizziness, chest pains, heart palpitations, nausea, vomiting, or diarrhea. Patient's cardiovascular risk factors include smoking 2-3 cigarettes/day and history of hyperlipidemia. Patient is also complaining of chronic low back pain.  She is followed by orthopedic services for this problem.  She states that she receives steroid injections every 3 months.  Current pain intensity is 2-3/10 characterized as aching and intermittent. Past Medical History:  Diagnosis Date  . Anxiety   . Blood transfusion without reported diagnosis 1982  . GERD (gastroesophageal reflux disease)   . Hepatitis C   . Hx of hepatitis C 2010  . Hyperlipidemia   . Hypertension   . Nerve damage 2001   left side as result of fall   Social History   Socioeconomic History  . Marital status: Divorced    Spouse name: Not on file  . Number of children: Not on file  . Years of education: Not on file  . Highest education level: Not on file  Social Needs  . Financial resource strain: Not on file  . Food insecurity - worry: Not on file  . Food insecurity - inability: Not on file  . Transportation needs - medical: Not on file  . Transportation needs - non-medical: Not on file  Occupational History  . Not on file  Tobacco Use  . Smoking status: Former Smoker    Packs/day: 0.50    Types: Cigarettes  . Smokeless tobacco: Current User  .  Tobacco comment: using patch.   Substance and Sexual Activity  . Alcohol use: Yes    Comment: rare  . Drug use: No  . Sexual activity: Not on file  Other Topics Concern  . Not on file  Social History Narrative  . Not on file   Immunization History  Administered Date(s) Administered  . Pneumococcal Polysaccharide-23 09/13/2016  . Tdap 09/13/2016    Review of Systems  Constitutional: Negative.   HENT: Negative.   Eyes: Negative.   Respiratory: Negative.   Gastrointestinal: Negative.   Endocrine: Negative.   Genitourinary: Negative.   Skin: Negative.   Allergic/Immunologic: Negative.   Neurological: Negative.        Objective:   Physical Exam  Constitutional: She is oriented to person, place, and time. She appears well-developed and well-nourished.  HENT:  Head: Normocephalic and atraumatic.  Right Ear: External ear normal.  Left Ear: External ear normal.  Nose: Nose normal.  Mouth/Throat: Oropharynx is clear and moist.  Eyes: Pupils are equal, round, and reactive to light.  Neck: Normal range of motion. Neck supple.  Cardiovascular: Normal rate, regular rhythm, normal heart sounds and intact distal pulses.  Pulmonary/Chest: Effort normal and breath sounds normal.  Abdominal: Soft. Bowel sounds are normal.  Musculoskeletal: Normal range of motion.  Neurological: She is alert and oriented to person, place, and time. She has normal reflexes.  Skin: Skin is warm and dry.  Psychiatric: She has a normal mood  and affect. Her behavior is normal. Judgment and thought content normal.      BP (!) 148/80 (BP Location: Right Arm, Patient Position: Sitting, Cuff Size: Large) Comment: manually  Pulse 86   Temp 98.2 F (36.8 C) (Oral)   Resp 16   Ht 5\' 5"  (1.651 m)   Wt 162 lb (73.5 kg)   SpO2 98%   BMI 26.96 kg/m   Assessment & Plan:  1. Essential hypertension Blood pressure is above goal on current medication regimen.  Will increase losartan to 100 mg daily.  Patient  will follow-up in 1 week for blood pressure check. Recommend that she continues DASH diet.  Reviewed urinalysis, no proteinuria present - losartan (COZAAR) 100 MG tablet; TAKE 1 TABLET(50 MG) BY MOUTH DAILY  Dispense: 30 tablet; Refill: 0 - Basic Metabolic Panel  2. Hyperlipidemia LDL goal <100 The 10-year ASCVD risk score [name], et al., 2013) is: 10%   Values used to calculate the score:     Age: 77 years     Sex: Female     Is Non-Hispanic African American: No     Diabetic: No     Tobacco smoker: No     Systolic Blood Pressure: 828 mmHg     Is BP treated: Yes     HDL Cholesterol: 59 mg/dL     Total Cholesterol: 263 mg/dL   3. Chronic low back pain without sciatica, unspecified back pain laterality Recommend that patient continues to follow-up with orthopedic services.  I will defer to them for further treatment and evaluation.  4. Night muscle spasms  - cyclobenzaprine (FLEXERIL) 10 MG tablet; TAKE 1 TABLET BY MOUTH THREE TIMES DAILY AS NEEDED  Dispense: 60 tablet; Refill: 0  5. Muscle spasm of left lower extremity - cyclobenzaprine (FLEXERIL) 10 MG tablet; TAKE 1 TABLET BY MOUTH THREE TIMES DAILY AS NEEDED  Dispense: 60 tablet; Refill: 0  6. Tobacco use Smoking cessation instruction/counseling given:  counseled patient on the dangers of tobacco use, advised patient to stop smoking, and reviewed strategies to maximize success    RTC: One week for blood pressure check.  3 months for hypertension    [name]  MSN, FNP-C [facility] [address] [place], [place] [address] [phone]

## 2017-11-20 NOTE — Patient Instructions (Addendum)
Your blood pressure is above goal on current medication regimen.  Will increase Losartan to 100 mg.  - Continue medication, monitor blood pressure at home. Continue DASH diet. Reminder to go to the ER if any CP, SOB, nausea, dizziness, severe HA, changes vision/speech, left arm numbness and tingling and jaw pain.

## 2017-11-21 LAB — BASIC METABOLIC PANEL
BUN / CREAT RATIO: 13 (ref 12–28)
BUN: 9 mg/dL (ref 8–27)
CO2: 23 mmol/L (ref 20–29)
CREATININE: 0.67 mg/dL (ref 0.57–1.00)
Calcium: 10.1 mg/dL (ref 8.7–10.3)
Chloride: 101 mmol/L (ref 96–106)
GFR calc Af Amer: 107 mL/min/{1.73_m2} (ref >59)
GFR calc non Af Amer: 93 mL/min/{1.73_m2} (ref >59)
Glucose: 106 mg/dL — ABNORMAL HIGH (ref 65–99)
POTASSIUM: 3.8 mmol/L (ref 3.5–5.2)
SODIUM: 141 mmol/L (ref 134–144)

## 2017-11-26 ENCOUNTER — Other Ambulatory Visit: Payer: Self-pay | Admitting: Family Medicine

## 2017-11-26 DIAGNOSIS — K219 Gastro-esophageal reflux disease without esophagitis: Secondary | ICD-10-CM

## 2017-11-26 DIAGNOSIS — E785 Hyperlipidemia, unspecified: Secondary | ICD-10-CM

## 2017-11-26 DIAGNOSIS — I10 Essential (primary) hypertension: Secondary | ICD-10-CM

## 2017-11-29 ENCOUNTER — Ambulatory Visit: Payer: Medicaid Other | Admitting: Family Medicine

## 2017-11-29 ENCOUNTER — Other Ambulatory Visit: Payer: Self-pay | Admitting: Family Medicine

## 2017-11-29 VITALS — BP 148/82

## 2017-11-29 DIAGNOSIS — I10 Essential (primary) hypertension: Secondary | ICD-10-CM

## 2017-11-29 NOTE — Progress Notes (Signed)
Patient came in for a bp check. Has been taken medication every day except 1 in the last week. Her bp was manually 148/82. I advised Cammie Sickle, NP, No medication changes were made at this time. Patient was asked to keep a bp log and check pressure every day and take medication every day and come back in 1 week for recheck. Thanks!

## 2017-12-06 ENCOUNTER — Ambulatory Visit: Payer: Medicaid Other

## 2017-12-06 VITALS — BP 126/76

## 2017-12-06 DIAGNOSIS — I10 Essential (primary) hypertension: Secondary | ICD-10-CM

## 2017-12-06 NOTE — Progress Notes (Signed)
Patient came in for bp check today. She states she has taken Losartan 100mg  every day and had taken it today. Her BP was 126/76 Manually. Patient asked to continue taking medication as prescribed and keep next scheduled follow up in June. Thanks!

## 2017-12-10 ENCOUNTER — Other Ambulatory Visit: Payer: Self-pay | Admitting: Family Medicine

## 2017-12-11 ENCOUNTER — Telehealth: Payer: Self-pay

## 2017-12-11 ENCOUNTER — Other Ambulatory Visit: Payer: Self-pay

## 2017-12-11 DIAGNOSIS — I10 Essential (primary) hypertension: Secondary | ICD-10-CM

## 2017-12-11 MED ORDER — HYDROCHLOROTHIAZIDE 12.5 MG PO TABS: ORAL_TABLET | ORAL | 0 refills | Status: DC

## 2017-12-11 NOTE — Telephone Encounter (Signed)
-----   Message from Dorena Dew, [place] sent at 12/10/2017  2:19 PM EDT ----- Muscle aches or cramps is a rare side effect with Tricor. Advise to take medication every other day. Also, increase water intake to 6-8 glasses per day.  If symptoms do not improve, will discontinue medication.  [name]  MSN, FNP-C Patient [facility] [address] [place], [place] [address] [phone]  ----- Message ----- From: Adelina Mings, LPN Sent: 12/05/5144  04:79 AM To: Dorena Dew, FNP  Patient came in for bp check today it was 126/76.... However she is saying she is still having cramps in legs with the change in cholesterol medication. Can you please advise if there is anything she can do for this? Please advise. Thanks!

## 2017-12-11 NOTE — Telephone Encounter (Signed)
Called and spoke with patient, advised to take Tricor every other day to help reduce muscle aches and cramps. Advised that she needs to increase water intake to 6 to 8 glasses of water daily and to let us know if symptoms do not improve. Thanks!

## 2017-12-16 ENCOUNTER — Other Ambulatory Visit: Payer: Self-pay | Admitting: Family Medicine

## 2017-12-16 DIAGNOSIS — I10 Essential (primary) hypertension: Secondary | ICD-10-CM

## 2017-12-27 ENCOUNTER — Other Ambulatory Visit: Payer: Self-pay | Admitting: Family Medicine

## 2017-12-27 DIAGNOSIS — K219 Gastro-esophageal reflux disease without esophagitis: Secondary | ICD-10-CM

## 2018-01-25 ENCOUNTER — Other Ambulatory Visit: Payer: Self-pay | Admitting: Family Medicine

## 2018-01-25 DIAGNOSIS — K219 Gastro-esophageal reflux disease without esophagitis: Secondary | ICD-10-CM

## 2018-02-20 ENCOUNTER — Ambulatory Visit (INDEPENDENT_AMBULATORY_CARE_PROVIDER_SITE_OTHER): Payer: Medicaid Other | Admitting: Family Medicine

## 2018-02-20 ENCOUNTER — Encounter: Payer: Self-pay | Admitting: Family Medicine

## 2018-02-20 VITALS — BP 162/94 | HR 82 | Temp 98.6°F | Resp 16 | Ht 65.0 in | Wt 168.0 lb

## 2018-02-20 DIAGNOSIS — M62838 Other muscle spasm: Secondary | ICD-10-CM | POA: Diagnosis not present

## 2018-02-20 DIAGNOSIS — E785 Hyperlipidemia, unspecified: Secondary | ICD-10-CM

## 2018-02-20 DIAGNOSIS — M25552 Pain in left hip: Secondary | ICD-10-CM | POA: Diagnosis not present

## 2018-02-20 DIAGNOSIS — G8929 Other chronic pain: Secondary | ICD-10-CM | POA: Diagnosis not present

## 2018-02-20 DIAGNOSIS — I10 Essential (primary) hypertension: Secondary | ICD-10-CM

## 2018-02-20 LAB — POCT URINALYSIS DIPSTICK
Bilirubin, UA: NEGATIVE
GLUCOSE UA: NEGATIVE
KETONES UA: NEGATIVE
LEUKOCYTES UA: NEGATIVE
NITRITE UA: NEGATIVE
Protein, UA: NEGATIVE
SPEC GRAV UA: 1.015 (ref 1.010–1.025)
Urobilinogen, UA: 0.2 E.U./dL
pH, UA: 7 (ref 5.0–8.0)

## 2018-02-20 MED ORDER — CYCLOBENZAPRINE HCL 10 MG PO TABS: ORAL_TABLET | ORAL | 1 refills | Status: DC

## 2018-02-20 MED ORDER — LOSARTAN POTASSIUM 100 MG PO TABS: ORAL_TABLET | ORAL | 1 refills | Status: DC

## 2018-02-20 MED ORDER — CLONIDINE HCL 0.1 MG PO TABS
0.2000 mg | ORAL_TABLET | Freq: Once | ORAL | Status: AC
  Administered 2018-02-20: 0.2 mg via ORAL

## 2018-02-20 NOTE — Progress Notes (Signed)
Subjective:   Chief Complaint  Patient presents with  . Hypertension  . Leg Pain    cramping in both legs due to fenofibrate    Margaret Kelly, a 67 year old [place] female presents for a follow up of hypertension and hyperlipidemia presents for a 3 month follow up. Margaret Kelly says that she has been taking all prescribed medications consistently. However, she did not take antihypertensive medication priro to arrival. She continues to follow a low fat, low sodium diet. She says that she grows her food and mostly eats organic products. Patient has been swimming daily for exercise. She does not monitor blood pressure at home.   She is a former smoker and has not smoked in greater than 6 months. She denies headache, dizziness, fatigue, syncope, heart palpitations, chest pain, shortness of breath or bilateral lower extremity edema.   Patient reports that she discontinued fenofibrate greater than 1 month ago due to worsening muscle spasms. Muscle spasms are primarily to lower extremities. She says that symptoms have improved slightly on current medication regimen.  Past Medical History:  Diagnosis Date  . Anxiety   . Blood transfusion without reported diagnosis 1982  . GERD (gastroesophageal reflux disease)   . Hepatitis C   . Hx of hepatitis C 2010  . Hyperlipidemia   . Hypertension   . Nerve damage 2001   left side as result of fall   Social History   Socioeconomic History  . Marital status: Divorced    Spouse name: Not on file  . Number of children: Not on file  . Years of education: Not on file  . Highest education level: Not on file  Occupational History  . Not on file  Social Needs  . Financial resource strain: Not on file  . Food insecurity:    Worry: Not on file    Inability: Not on file  . Transportation needs:    Medical: Not on file    Non-medical: Not on file  Tobacco Use  . Smoking status: Former Smoker    Packs/day: 0.50    Types: Cigarettes  . Smokeless  tobacco: Current User  . Tobacco comment: using patch.   Substance and Sexual Activity  . Alcohol use: Yes    Comment: rare  . Drug use: No  . Sexual activity: Not on file  Lifestyle  . Physical activity:    Days per week: Not on file    Minutes per session: Not on file  . Stress: Not on file  Relationships  . Social connections:    Talks on phone: Not on file    Gets together: Not on file    Attends religious service: Not on file    Active member of club or organization: Not on file    Attends meetings of clubs or organizations: Not on file    Relationship status: Not on file  . Intimate partner violence:    Fear of current or ex partner: Not on file    Emotionally abused: Not on file    Physically abused: Not on file    Forced sexual activity: Not on file  Other Topics Concern  . Not on file  Social History Narrative  . Not on file   Immunization History  Administered Date(s) Administered  . Pneumococcal Polysaccharide-23 09/13/2016  . Tdap 09/13/2016   Allergies  Allergen Reactions  . Amlodipine Other (See Comments)    Ringing in ears   Review of Systems  Constitutional: Negative.  HENT: Negative.   Eyes: Negative.   Respiratory: Negative for shortness of breath and wheezing.   Cardiovascular: Negative for chest pain, palpitations and leg swelling.  Gastrointestinal: Negative.   Endocrine: Negative.   Genitourinary: Negative.   Musculoskeletal: Negative.        Muscle spasms to lower extremites  Skin: Negative.   Allergic/Immunologic: Negative.   Neurological: Negative.   Hematological: Negative.      Objective:  Physical Exam  Constitutional: She is oriented to person, place, and time. She appears well-developed and well-nourished.  HENT:  Head: Normocephalic.  Eyes: Pupils are equal, round, and reactive to light.  Neck: Normal range of motion.  Cardiovascular: Normal rate, regular rhythm, normal heart sounds and intact distal pulses.   Pulmonary/Chest: Effort normal and breath sounds normal.  Abdominal: Soft. Bowel sounds are normal. She exhibits no distension. There is no tenderness.  Musculoskeletal: Normal range of motion.  Neurological: She is alert and oriented to person, place, and time.  Skin: Skin is warm and dry.  Psychiatric: She has a normal mood and affect. Her behavior is normal. Judgment and thought content normal.    Assessment:    Hypertension, stage 2    Plan:  BP (!) 162/94 Comment: manually  Pulse 82   Temp 98.6 F (37 C) (Oral)   Resp 16   Ht 5\' 5"  (1.651 m)   Wt 168 lb (76.2 kg)   SpO2 97%   BMI 27.96 kg/m  Essential hypertension Blood pressure was markedly elevated on arrival. Patient received clonidine 0.2 mg, blood pressure decrease after 15 minutes.  Patient advised to take medications consistently in order to achieve positive outcomes.   We have discussed target BP range and blood pressure goal. I have advised patient to check BP regularly and to call us back or report to clinic if the numbers are consistently higher than 140/90. We discussed the importance of compliance with medical therapy and DASH diet recommended, consequences of uncontrolled hypertension discussed.  - Urinalysis Dipstick - cloNIDine (CATAPRES) tablet 0.2 mg - Comprehensive metabolic panel - losartan (COZAAR) 100 MG tablet; TAKE 1 TABLET(50 MG) BY MOUTH DAILY  Dispense: 90 tablet; Refill: 1  2. Chronic left hip pain Advised [name] to follow up with orthopedic services.  - Arthritis Panel  3. Hyperlipidemia, unspecified hyperlipidemia type The 10-year ASCVD risk score Margaret Kelly., et al., 2013) is: 11.9%   Values used to calculate the score:     Age: 47 years     Sex: Female     Is Non-Hispanic African American: No     Diabetic: No     Tobacco smoker: No     Systolic Blood Pressure: 130 mmHg     Is BP treated: Yes     HDL Cholesterol: 59 mg/dL     Total Cholesterol: 263 mg/dL  - Lipid Panel  4.  Night muscle spasms Will continue cyclobenzaprine at current dosage. Advised to take TID PRN.   - cyclobenzaprine (FLEXERIL) 10 MG tablet; TAKE 1 TABLET BY MOUTH THREE TIMES DAILY AS NEEDED  Dispense: 90 tablet; Refill: 1

## 2018-02-20 NOTE — Patient Instructions (Signed)
Your blood pressure was markedly elevated.  I recommend that you take your medication consistently in order to achieve positive outcomes.  Continue to take losartan 100 mg daily as prescribed. I will follow-up with you by phone with any abnormal laboratory results Your lipoma is 2.5 cm x 2.5 cm, we will continue to watch and wait.  If lipoma becomes painful or larger, I recommend that you schedule follow-up and we will send a referral to general surgery at that time. Make an appointment with orthopedic doctor for injection of left hip For muscle spasms to lower extremities I recommend that you continue Flexeril 10 mg at bedtime.  You can increase to every 8 hours if problem worsens.

## 2018-02-21 ENCOUNTER — Telehealth: Payer: Self-pay

## 2018-02-21 DIAGNOSIS — M25552 Pain in left hip: Secondary | ICD-10-CM | POA: Insufficient documentation

## 2018-02-21 HISTORY — DX: Pain in left hip: M25.552

## 2018-02-21 LAB — COMPREHENSIVE METABOLIC PANEL
A/G RATIO: 1.7 (ref 1.2–2.2)
ALBUMIN: 4.4 g/dL (ref 3.6–4.8)
ALT: 25 IU/L (ref 0–32)
AST: 22 IU/L (ref 0–40)
Alkaline Phosphatase: 57 IU/L (ref 39–117)
BILIRUBIN TOTAL: 0.4 mg/dL (ref 0.0–1.2)
BUN / CREAT RATIO: 19 (ref 12–28)
BUN: 12 mg/dL (ref 8–27)
CALCIUM: 9.2 mg/dL (ref 8.7–10.3)
CHLORIDE: 105 mmol/L (ref 96–106)
CO2: 24 mmol/L (ref 20–29)
Creatinine, Ser: 0.62 mg/dL (ref 0.57–1.00)
GFR, EST AFRICAN AMERICAN: 110 mL/min/{1.73_m2} (ref >59)
GFR, EST NON AFRICAN AMERICAN: 96 mL/min/{1.73_m2} (ref >59)
GLOBULIN, TOTAL: 2.6 g/dL (ref 1.5–4.5)
Glucose: 103 mg/dL — ABNORMAL HIGH (ref 65–99)
POTASSIUM: 3.8 mmol/L (ref 3.5–5.2)
Sodium: 142 mmol/L (ref 134–144)
TOTAL PROTEIN: 7 g/dL (ref 6.0–8.5)

## 2018-02-21 LAB — LIPID PANEL
CHOL/HDL RATIO: 4.1 ratio (ref 0.0–4.4)
Cholesterol, Total: 220 mg/dL — ABNORMAL HIGH (ref 100–199)
HDL: 54 mg/dL (ref >39)
LDL Calculated: 135 mg/dL — ABNORMAL HIGH (ref 0–99)
Triglycerides: 156 mg/dL — ABNORMAL HIGH (ref 0–149)
VLDL Cholesterol Cal: 31 mg/dL (ref 5–40)

## 2018-02-21 LAB — ARTHRITIS PANEL
Anti Nuclear Antibody(ANA): NEGATIVE
Rhuematoid fact SerPl-aCnc: <10 IU/mL (ref 0.0–13.9)
Sed Rate: 20 mm/hr (ref 0–40)
URIC ACID: 3.8 mg/dL (ref 2.5–7.1)

## 2018-02-21 NOTE — Telephone Encounter (Signed)
-----   Message from Dorena Dew, [place] sent at 02/21/2018  8:13 AM EDT ----- Regarding: lab results Please inform patient that total cholesterol is elevated at 220, goal is < 200. Recommend an OTC fish oil tablet every evening with dinner. Recommend a lowfat, low carbohydrate diet divided over 5-6 small meals, increase water intake to 6-8 glasses, and 150 minutes per week of cardiovascular exercise.    [name]  MSN, FNP-C Patient [facility] [address] [place], [place] [address] [phone]

## 2018-02-21 NOTE — Telephone Encounter (Signed)
Called and spoke with patient, advised that cholesterol was elevated at 220. Advised to take otc fish oil every evening with dinner. Asked that she eat a low fat/ low carb diet over 5 to 6 small meals daily and increase water to 6 to 8 glasses daily and exercise 150 minutes weekly of cardio. Patient verbalized understanding. Thanks!

## 2018-02-23 ENCOUNTER — Other Ambulatory Visit: Payer: Self-pay | Admitting: Family Medicine

## 2018-02-23 DIAGNOSIS — K219 Gastro-esophageal reflux disease without esophagitis: Secondary | ICD-10-CM

## 2018-02-26 ENCOUNTER — Other Ambulatory Visit: Payer: Self-pay | Admitting: Family Medicine

## 2018-02-26 ENCOUNTER — Ambulatory Visit: Payer: Medicaid Other

## 2018-02-26 VITALS — BP 152/88

## 2018-02-26 DIAGNOSIS — M62838 Other muscle spasm: Secondary | ICD-10-CM

## 2018-02-26 DIAGNOSIS — I10 Essential (primary) hypertension: Secondary | ICD-10-CM

## 2018-02-26 MED ORDER — BLOOD PRESSURE MONITORING DEVI: 1.0000 | Freq: Two times a day (BID) | 0 refills | Status: DC

## 2018-02-26 MED ORDER — LOSARTAN POTASSIUM 100 MG PO TABS: ORAL_TABLET | ORAL | 1 refills | Status: DC

## 2018-02-26 MED ORDER — METOPROLOL SUCCINATE ER 25 MG PO TB24: 12.5000 mg | ORAL_TABLET | Freq: Every day | ORAL | 0 refills | Status: DC

## 2018-02-26 NOTE — Progress Notes (Signed)
A 67 year old female with a history of uncontrolled hypertension presents for blood pressure check.  Blood pressure above goal.  Will start a trial of metoprolol 12.5 mg daily.- Continue medication, monitor blood pressure at home. Continue DASH diet. Reminder to go to the ER if any CP, SOB, nausea, dizziness, severe HA, changes vision/speech, left arm numbness and tingling and jaw pain.   Margaret Pounds  MSN, FNP-C Patient [facility] [address] [place], [place] [address] [phone]

## 2018-03-04 ENCOUNTER — Ambulatory Visit (INDEPENDENT_AMBULATORY_CARE_PROVIDER_SITE_OTHER): Payer: Medicaid Other | Admitting: Family Medicine

## 2018-03-04 VITALS — BP 178/88

## 2018-03-04 DIAGNOSIS — I10 Essential (primary) hypertension: Secondary | ICD-10-CM | POA: Diagnosis not present

## 2018-03-04 DIAGNOSIS — Z09 Encounter for follow-up examination after completed treatment for conditions other than malignant neoplasm: Secondary | ICD-10-CM | POA: Diagnosis not present

## 2018-03-04 MED ORDER — CLONIDINE HCL 0.1 MG PO TABS
0.1000 mg | ORAL_TABLET | Freq: Once | ORAL | Status: AC
Start: 2018-03-04 — End: 2018-03-04
  Administered 2018-03-04: 0.1 mg via ORAL

## 2018-03-04 NOTE — Progress Notes (Signed)
Subjective:    Patient ID: Margaret Kelly, female    DOB: Jan 09, 1951, 67 y.o.   MRN: [id number]   PCP: Kathe Becton, NP   No chief complaint on file.   HPI  Ms. Lancon has a past medical history of Hypertension, Hyperlipidemia, History of Hepatitis C, GERD, and Anxiety. She is here today for blood pressure check.   Current Status: Today she states that she was initiated on a new blood pressure medication at her last office visit. She states that she does not like taking medicatons for her blood pressure and that her pressures are normally high, but she feels fine.   Denies severe headaches, confusion, seizures, double vision, and blurred vision, nausea and vomiting.  Past Medical History:  Diagnosis Date  . Anxiety   . Blood transfusion without reported diagnosis 1982  . GERD (gastroesophageal reflux disease)   . Hepatitis C   . Hx of hepatitis C 2010  . Hyperlipidemia   . Hypertension   . Nerve damage 2001   left side as result of fall    Family History  Problem Relation Age of Onset  . Colon cancer Neg Hx   . Esophageal cancer Neg Hx   . Rectal cancer Neg Hx   . Stomach cancer Neg Hx     Social History   Socioeconomic History  . Marital status: Divorced    Spouse name: Not on file  . Number of children: Not on file  . Years of education: Not on file  . Highest education level: Not on file  Occupational History  . Not on file  Social Needs  . Financial resource strain: Not on file  . Food insecurity:    Worry: Not on file    Inability: Not on file  . Transportation needs:    Medical: Not on file    Non-medical: Not on file  Tobacco Use  . Smoking status: Former Smoker    Packs/day: 0.50    Types: Cigarettes  . Smokeless tobacco: Current User  . Tobacco comment: using patch.   Substance and Sexual Activity  . Alcohol use: Yes    Comment: rare  . Drug use: No  . Sexual activity: Not on file  Lifestyle  . Physical activity:    Days per week:  Not on file    Minutes per session: Not on file  . Stress: Not on file  Relationships  . Social connections:    Talks on phone: Not on file    Gets together: Not on file    Attends religious service: Not on file    Active member of club or organization: Not on file    Attends meetings of clubs or organizations: Not on file    Relationship status: Not on file  . Intimate partner violence:    Fear of current or ex partner: Not on file    Emotionally abused: Not on file    Physically abused: Not on file    Forced sexual activity: Not on file  Other Topics Concern  . Not on file  Social History Narrative  . Not on file    Past Surgical History:  Procedure Laterality Date  . ABDOMINAL HYSTERECTOMY  1982  . CARPAL TUNNEL RELEASE Left 2005  . foot spur Left 2007   foot   Immunization History  Administered Date(s) Administered  . Pneumococcal Polysaccharide-23 09/13/2016  . Tdap 09/13/2016    Current Meds  Medication Sig  . ALPRAZolam (XANAX) 0.5  MG tablet Take 0.5 mg by mouth 2 (two) times daily as needed for anxiety.  . Blood Pressure Monitoring DEVI 1 each by Does not apply route 2 (two) times daily.  . Calcium Carbonate (CALCIUM 600 PO) Take by mouth daily.  . cyclobenzaprine (FLEXERIL) 10 MG tablet TAKE 1 TABLET BY MOUTH THREE TIMES DAILY AS NEEDED  . fenofibrate (TRICOR) 145 MG tablet TAKE 1 TABLET(145 MG) BY MOUTH DAILY  . hydrochlorothiazide (HYDRODIURIL) 12.5 MG tablet TAKE 2 TABLETS(25 MG) BY MOUTH DAILY  . losartan (COZAAR) 100 MG tablet TAKE 1 TABLET BY MOUTH DAILY  . Melatonin 10 MG CAPS Take by mouth. Takes 2 tablets at bedtime  . metoprolol succinate (TOPROL XL) 25 MG 24 hr tablet Take 0.5 tablets (12.5 mg total) by mouth daily.  . Multiple Vitamin (MULTIVITAMIN) tablet Take 1 tablet by mouth daily.  . Omega-3 Fatty Acids (FISH OIL) 1200 MG CAPS Take by mouth daily.  Marland Kitchen omeprazole (PRILOSEC) 40 MG capsule TAKE 1 CAPSULE(40 MG) BY MOUTH DAILY    Allergies   Allergen Reactions  . Amlodipine Other (See Comments)    Ringing in ears    BP (!) 178/88   Review of Systems  Respiratory: Negative.   Cardiovascular: Negative.   Neurological: Negative.    Objective:   Physical Exam  Constitutional: She is oriented to person, place, and time. She appears well-developed and well-nourished.  Cardiovascular: Normal rate.  Pulmonary/Chest: Effort normal.  Neurological: She is alert and oriented to person, place, and time.  Skin: Skin is warm and dry.  Psychiatric: She has a normal mood and affect. Her behavior is normal. Judgment and thought content normal.  Nursing note and vitals reviewed.  Assessment & Plan:   1. Essential hypertension Initial blood pressure is 178/90 today. She received Clonidine 0.1 mg, waited 20-30 minutes and re-check of blood pressure was 178/88.  - cloNIDine (CATAPRES) tablet 0.1 mg  2. Follow up She will follow up for blood pressure check in 2 weeks.   Meds ordered this encounter  Medications  . cloNIDine (CATAPRES) tablet 0.1 mg    Kathe Becton,  MSN, FNP-BC Patient [facility] [address] [place], [place] [address] [phone]

## 2018-03-05 ENCOUNTER — Other Ambulatory Visit: Payer: Self-pay | Admitting: Family Medicine

## 2018-03-05 DIAGNOSIS — G8929 Other chronic pain: Secondary | ICD-10-CM

## 2018-03-05 DIAGNOSIS — M25569 Pain in unspecified knee: Principal | ICD-10-CM

## 2018-03-05 NOTE — Progress Notes (Signed)
Orders Placed This Encounter  Procedures  . AMB referral to orthopedics    Referral Priority:   Routine    Referral Type:   Consultation    Number of Visits Requested:   [place]  MSN, FNP-C Patient [facility] [address] [place], [place] [address] [phone]

## 2018-03-06 ENCOUNTER — Encounter: Payer: Self-pay | Admitting: Family Medicine

## 2018-03-07 ENCOUNTER — Other Ambulatory Visit: Payer: Self-pay | Admitting: Family Medicine

## 2018-03-07 DIAGNOSIS — I10 Essential (primary) hypertension: Secondary | ICD-10-CM

## 2018-03-22 ENCOUNTER — Other Ambulatory Visit: Payer: Self-pay | Admitting: Family Medicine

## 2018-03-22 DIAGNOSIS — I10 Essential (primary) hypertension: Secondary | ICD-10-CM

## 2018-03-22 DIAGNOSIS — K219 Gastro-esophageal reflux disease without esophagitis: Secondary | ICD-10-CM

## 2018-03-24 ENCOUNTER — Other Ambulatory Visit: Payer: Self-pay | Admitting: Family Medicine

## 2018-03-24 DIAGNOSIS — E785 Hyperlipidemia, unspecified: Secondary | ICD-10-CM

## 2018-03-25 ENCOUNTER — Ambulatory Visit (INDEPENDENT_AMBULATORY_CARE_PROVIDER_SITE_OTHER): Payer: Self-pay | Admitting: Orthopaedic Surgery

## 2018-04-19 ENCOUNTER — Other Ambulatory Visit: Payer: Self-pay | Admitting: Family Medicine

## 2018-04-19 DIAGNOSIS — K219 Gastro-esophageal reflux disease without esophagitis: Secondary | ICD-10-CM

## 2018-04-19 DIAGNOSIS — I10 Essential (primary) hypertension: Secondary | ICD-10-CM

## 2018-05-05 ENCOUNTER — Other Ambulatory Visit: Payer: Self-pay | Admitting: Family Medicine

## 2018-05-05 DIAGNOSIS — I10 Essential (primary) hypertension: Secondary | ICD-10-CM

## 2018-05-20 ENCOUNTER — Other Ambulatory Visit: Payer: Self-pay | Admitting: Family Medicine

## 2018-05-20 DIAGNOSIS — I10 Essential (primary) hypertension: Secondary | ICD-10-CM

## 2018-05-20 DIAGNOSIS — K219 Gastro-esophageal reflux disease without esophagitis: Secondary | ICD-10-CM

## 2018-05-20 DIAGNOSIS — M62838 Other muscle spasm: Secondary | ICD-10-CM

## 2018-05-23 ENCOUNTER — Ambulatory Visit (INDEPENDENT_AMBULATORY_CARE_PROVIDER_SITE_OTHER): Payer: Medicare Other | Admitting: Family Medicine

## 2018-05-23 ENCOUNTER — Encounter: Payer: Self-pay | Admitting: Family Medicine

## 2018-05-23 DIAGNOSIS — M62838 Other muscle spasm: Secondary | ICD-10-CM | POA: Diagnosis not present

## 2018-05-23 DIAGNOSIS — I10 Essential (primary) hypertension: Secondary | ICD-10-CM | POA: Diagnosis not present

## 2018-05-23 DIAGNOSIS — K219 Gastro-esophageal reflux disease without esophagitis: Secondary | ICD-10-CM

## 2018-05-23 DIAGNOSIS — E785 Hyperlipidemia, unspecified: Secondary | ICD-10-CM | POA: Diagnosis not present

## 2018-05-23 MED ORDER — FENOFIBRATE 145 MG PO TABS: ORAL_TABLET | ORAL | 3 refills | Status: DC

## 2018-05-23 MED ORDER — CYCLOBENZAPRINE HCL 10 MG PO TABS: ORAL_TABLET | ORAL | 1 refills | Status: DC

## 2018-05-23 MED ORDER — METOPROLOL SUCCINATE ER 25 MG PO TB24: ORAL_TABLET | ORAL | 3 refills | Status: DC

## 2018-05-23 MED ORDER — HYDROCHLOROTHIAZIDE 12.5 MG PO TABS: 12.5000 mg | ORAL_TABLET | Freq: Every day | ORAL | 3 refills | Status: DC

## 2018-05-23 MED ORDER — LOSARTAN POTASSIUM 100 MG PO TABS: ORAL_TABLET | ORAL | 3 refills | Status: DC

## 2018-05-23 MED ORDER — OMEPRAZOLE 40 MG PO CPDR: DELAYED_RELEASE_CAPSULE | ORAL | 1 refills | Status: DC

## 2018-05-23 NOTE — Patient Instructions (Addendum)
Continue with your medications as previously prescribed. It was a pleasure meeting you today.    Hypertension Hypertension is another name for high blood pressure. High blood pressure forces your heart to work harder to pump blood. This can cause problems over time. There are two numbers in a blood pressure reading. There is a top number (systolic) over a bottom number (diastolic). It is best to have a blood pressure below 120/80. Healthy choices can help lower your blood pressure. You may need medicine to help lower your blood pressure if:  Your blood pressure cannot be lowered with healthy choices.  Your blood pressure is higher than 130/80.  Follow these instructions at home: Eating and drinking  If directed, follow the DASH eating plan. This diet includes: ? Filling half of your plate at each meal with fruits and vegetables. ? Filling one quarter of your plate at each meal with whole grains. Whole grains include whole wheat pasta, brown rice, and whole grain bread. ? Eating or drinking low-fat dairy products, such as skim milk or low-fat yogurt. ? Filling one quarter of your plate at each meal with low-fat (lean) proteins. Low-fat proteins include fish, skinless chicken, eggs, beans, and tofu. ? Avoiding fatty meat, cured and processed meat, or chicken with skin. ? Avoiding premade or processed food.  Eat less than 1,500 mg of salt (sodium) a day.  Limit alcohol use to no more than 1 drink a day for nonpregnant women and 2 drinks a day for men. One drink equals 12 oz of beer, 5 oz of wine, or 1 oz of hard liquor. Lifestyle  Work with your doctor to stay at a healthy weight or to lose weight. Ask your doctor what the best weight is for you.  Get at least 30 minutes of exercise that causes your heart to beat faster (aerobic exercise) most days of the week. This may include walking, swimming, or biking.  Get at least 30 minutes of exercise that strengthens your muscles (resistance  exercise) at least 3 days a week. This may include lifting weights or pilates.  Do not use any products that contain nicotine or tobacco. This includes cigarettes and e-cigarettes. If you need help quitting, ask your doctor.  Check your blood pressure at home as told by your doctor.  Keep all follow-up visits as told by your doctor. This is important. Medicines  Take over-the-counter and prescription medicines only as told by your doctor. Follow directions carefully.  Do not skip doses of blood pressure medicine. The medicine does not work as well if you skip doses. Skipping doses also puts you at risk for problems.  Ask your doctor about side effects or reactions to medicines that you should watch for. Contact a doctor if:  You think you are having a reaction to the medicine you are taking.  You have headaches that keep coming back (recurring).  You feel dizzy.  You have swelling in your ankles.  You have trouble with your vision. Get help right away if:  You get a very bad headache.  You start to feel confused.  You feel weak or numb.  You feel faint.  You get very bad pain in your: ? Chest. ? Belly (abdomen).  You throw up (vomit) more than once.  You have trouble breathing. Summary  Hypertension is another name for high blood pressure.  Making healthy choices can help lower blood pressure. If your blood pressure cannot be controlled with healthy choices, you may need to  take medicine. This information is not intended to replace advice given to you by your health care provider. Make sure you discuss any questions you have with your health care provider. Document Released: 02/06/2008 Document Revised: 07/18/2016 Document Reviewed: 07/18/2016 Elsevier Interactive Patient Education  [employer].

## 2018-05-23 NOTE — Progress Notes (Signed)
[facility]  Provider: Lanae Boast, FNP   Hypertension Follow Up Visit  SUBJECTIVE:  Margaret Kelly is a 67 y.o. female who  has a past medical history of Anxiety, Blood transfusion without reported diagnosis (1982), GERD (gastroesophageal reflux disease), Hepatitis C, hepatitis C (2010), Hyperlipidemia, Hypertension, and Nerve damage (2001). .   New concerns: Patient reports still having muscle cramping in bilateral legs. She states that she had a hx of low sodium and will sometimes crave salt. She states that the cramps are relieved by eating salty foods.   Current Outpatient Medications  Medication Sig Dispense Refill  . ALPRAZolam (XANAX) 0.5 MG tablet Take 0.5 mg by mouth 2 (two) times daily as needed for anxiety.    . Blood Pressure Monitoring DEVI 1 each by Does not apply route 2 (two) times daily. 1 Device 0  . Calcium Carbonate (CALCIUM 600 PO) Take by mouth daily.    . cyclobenzaprine (FLEXERIL) 10 MG tablet TAKE 1 TABLET BY MOUTH THREE TIMES DAILY AS NEEDED 90 tablet 1  . fenofibrate (TRICOR) 145 MG tablet TAKE 1 TABLET(145 MG) BY MOUTH DAILY 90 tablet 0  . hydrochlorothiazide (HYDRODIURIL) 12.5 MG tablet TAKE 2 TABLETS(25 MG) BY MOUTH DAILY 90 tablet 0  . hydrochlorothiazide (HYDRODIURIL) 12.5 MG tablet TAKE 2 TABLETS(25 MG) BY MOUTH DAILY 90 tablet 0  . losartan (COZAAR) 100 MG tablet TAKE 1 TABLET BY MOUTH DAILY 90 tablet 1  . losartan (COZAAR) 100 MG tablet TAKE 1 TABLET BY MOUTH DAILY 90 tablet 0  . losartan (COZAAR) 50 MG tablet TAKE 1 TABLET(50 MG) BY MOUTH DAILY 90 tablet 0  . Melatonin 10 MG CAPS Take by mouth. Takes 2 tablets at bedtime    . metoprolol succinate (TOPROL-XL) 25 MG 24 hr tablet TAKE 1/2 TABLET(12.5 MG) BY MOUTH DAILY 30 tablet 0  . Multiple Vitamin (MULTIVITAMIN) tablet Take 1 tablet by mouth daily.    . Omega-3 Fatty Acids (FISH OIL) 1200 MG CAPS Take by mouth daily.    Marland Kitchen omeprazole (PRILOSEC)  40 MG capsule TAKE 1 CAPSULE(40 MG) BY MOUTH DAILY 30 capsule 0   No current facility-administered medications for this visit.     No results found for this or any previous visit (from the past 2160 hour(s)).  Hypertension ROS: Review of Systems  Constitutional: Negative.   HENT: Negative.   Eyes: Negative.   Respiratory: Negative.   Cardiovascular: Negative.   Gastrointestinal: Negative.   Genitourinary: Negative.   Musculoskeletal: Positive for myalgias (intermittent leg cramps bilaterally).  Skin: Negative.   Neurological: Negative.   Psychiatric/Behavioral: Negative.      OBJECTIVE:   BP (!) 162/90 (BP Location: Right Arm, Patient Position: Sitting, Cuff Size: Large) Comment: MANUALLY  Temp 98.1 F (36.7 C) (Oral)   Resp 16   Ht 5\' 5"  (1.651 m)   Wt 173 lb (78.5 kg)   SpO2 97%   BMI 28.79 kg/m   Physical Exam  Constitutional: She is oriented to person, place, and time. She appears well-developed and well-nourished. No distress.  HENT:  Head: Normocephalic and atraumatic.  Eyes: Pupils are equal, round, and reactive to light. Conjunctivae and EOM are normal.  Neck: Normal range of motion.  Cardiovascular: Normal rate, normal heart sounds and intact distal pulses.  No murmur heard. Pulmonary/Chest: Effort normal and breath sounds normal. No respiratory distress.  Abdominal: Soft. Bowel sounds are normal. She exhibits no distension.  Musculoskeletal: Normal range of motion.  She exhibits no edema or tenderness.  Neurological: She is alert and oriented to person, place, and time.  Skin: Skin is warm and dry.  Psychiatric: She has a normal mood and affect. Her behavior is normal. Judgment and thought content normal.     ASSESSMENT/PLAN:   1. Essential hypertension Patient advised to take BP medication as prescribed.  - losartan (COZAAR) 100 MG tablet; TAKE 1 TABLET BY MOUTH DAILY  Dispense: 90 tablet; Refill: 3 - hydrochlorothiazide (HYDRODIURIL) 12.5 MG tablet;  Take 1 tablet (12.5 mg total) by mouth daily.  Dispense: 90 tablet; Refill: 3 - metoprolol succinate (TOPROL-XL) 25 MG 24 hr tablet; TAKE 1/2 TABLET(12.5 MG) BY MOUTH DAILY  Dispense: 45 tablet; Refill: 3 - Comprehensive metabolic panel  2. Night muscle spasms Discussed adequate hydration.  - losartan (COZAAR) 100 MG tablet; TAKE 1 TABLET BY MOUTH DAILY  Dispense: 90 tablet; Refill: 3 - cyclobenzaprine (FLEXERIL) 10 MG tablet; Take 1 tablet QHS PRN  Dispense: 90 tablet; Refill: 1  3. Hyperlipidemia LDL goal <100 Patient states that she is doubling the fish oil instead of taking tricor.  - Lipid Panel - Comprehensive metabolic panel  4. Gastroesophageal reflux disease without esophagitis - omeprazole (PRILOSEC) 40 MG capsule; TAKE 1 CAPSULE(40 MG) BY MOUTH DAILY  Dispense: 90 capsule; Refill: 1   The patient is asked to make an attempt to improve diet and exercise patterns to aid in medical management of this problem.  Return to care as scheduled and prn. Patient verbalized understanding and agreed with plan of care.    Ms. Doug Sou. Nathaneil Canary, FNP-BC Patient [facility] [address] [place], [place] [address] [phone]

## 2018-05-24 LAB — LIPID PANEL
Chol/HDL Ratio: 4.4 ratio (ref 0.0–4.4)
Cholesterol, Total: 261 mg/dL — ABNORMAL HIGH (ref 100–199)
HDL: 60 mg/dL (ref >39)
LDL Calculated: 171 mg/dL — ABNORMAL HIGH (ref 0–99)
Triglycerides: 151 mg/dL — ABNORMAL HIGH (ref 0–149)
VLDL Cholesterol Cal: 30 mg/dL (ref 5–40)

## 2018-05-24 LAB — COMPREHENSIVE METABOLIC PANEL
ALT: 20 IU/L (ref 0–32)
AST: 22 IU/L (ref 0–40)
Albumin/Globulin Ratio: 1.8 (ref 1.2–2.2)
Albumin: 4.6 g/dL (ref 3.6–4.8)
Alkaline Phosphatase: 63 IU/L (ref 39–117)
BUN/Creatinine Ratio: 16 (ref 12–28)
BUN: 12 mg/dL (ref 8–27)
Bilirubin Total: 0.5 mg/dL (ref 0.0–1.2)
CO2: 27 mmol/L (ref 20–29)
Calcium: 9.5 mg/dL (ref 8.7–10.3)
Chloride: 100 mmol/L (ref 96–106)
Creatinine, Ser: 0.73 mg/dL (ref 0.57–1.00)
GFR calc Af Amer: 100 mL/min/{1.73_m2} (ref >59)
GFR calc non Af Amer: 87 mL/min/{1.73_m2} (ref >59)
Globulin, Total: 2.5 g/dL (ref 1.5–4.5)
Glucose: 103 mg/dL — ABNORMAL HIGH (ref 65–99)
Potassium: 3.6 mmol/L (ref 3.5–5.2)
Sodium: 144 mmol/L (ref 134–144)
Total Protein: 7.1 g/dL (ref 6.0–8.5)

## 2018-05-29 ENCOUNTER — Encounter: Payer: Self-pay | Admitting: Family Medicine

## 2018-06-20 ENCOUNTER — Other Ambulatory Visit: Payer: Self-pay | Admitting: Family Medicine

## 2018-06-20 DIAGNOSIS — I10 Essential (primary) hypertension: Secondary | ICD-10-CM

## 2018-06-20 DIAGNOSIS — K219 Gastro-esophageal reflux disease without esophagitis: Secondary | ICD-10-CM

## 2018-08-22 ENCOUNTER — Ambulatory Visit: Payer: Self-pay | Admitting: Family Medicine

## 2018-08-28 ENCOUNTER — Ambulatory Visit: Payer: Self-pay | Admitting: Family Medicine

## 2018-09-05 ENCOUNTER — Ambulatory Visit (HOSPITAL_COMMUNITY)
Admission: RE | Admit: 2018-09-05 | Discharge: 2018-09-05 | Disposition: A | Discharge status: Home / Self Care | Payer: Medicare Other | Source: Ambulatory Visit | Attending: Family Medicine | Admitting: Family Medicine

## 2018-09-05 ENCOUNTER — Ambulatory Visit (INDEPENDENT_AMBULATORY_CARE_PROVIDER_SITE_OTHER): Payer: Medicare Other | Admitting: Family Medicine

## 2018-09-05 VITALS — BP 148/82 | HR 92 | Temp 98.4°F | Resp 14 | Ht 65.0 in | Wt 170.0 lb

## 2018-09-05 DIAGNOSIS — M25552 Pain in left hip: Secondary | ICD-10-CM | POA: Insufficient documentation

## 2018-09-05 DIAGNOSIS — M62838 Other muscle spasm: Secondary | ICD-10-CM | POA: Diagnosis not present

## 2018-09-05 DIAGNOSIS — I10 Essential (primary) hypertension: Secondary | ICD-10-CM

## 2018-09-05 DIAGNOSIS — M160 Bilateral primary osteoarthritis of hip: Secondary | ICD-10-CM | POA: Diagnosis not present

## 2018-09-05 DIAGNOSIS — M25551 Pain in right hip: Secondary | ICD-10-CM

## 2018-09-05 LAB — POCT URINALYSIS DIPSTICK
Bilirubin, UA: NEGATIVE
Glucose, UA: NEGATIVE
Ketones, UA: NEGATIVE
Leukocytes, UA: NEGATIVE
Nitrite, UA: NEGATIVE
Protein, UA: POSITIVE — AB
Spec Grav, UA: 1.015 (ref 1.010–1.025)
Urobilinogen, UA: 0.2 E.U./dL
pH, UA: 6.5 (ref 5.0–8.0)

## 2018-09-05 MED ORDER — CYCLOBENZAPRINE HCL 10 MG PO TABS: ORAL_TABLET | ORAL | 1 refills | Status: DC

## 2018-09-05 NOTE — Progress Notes (Signed)
Patient [facility]   Progress Note: General Provider: Lanae Boast, FNP  SUBJECTIVE:   Margaret Kelly is a 68 y.o. female who  has a past medical history of Anxiety, Blood transfusion without reported diagnosis (1982), GERD (gastroesophageal reflux disease), Hepatitis C, hepatitis C (2010), Hyperlipidemia, Hypertension, and Nerve damage (2001).. Patient presents today for Hypertension; Hyperlipidemia; and Arm Problem (tingling in left arm ) Patient states that she has a hx of muscle spasms in the left arm due to old injury. Patient states that she had carpal tunnel release surgery in 2005. Patient states that the numbness is intermittent and is relieved with massage and movement. Denies chest pain SOB, dizziness.  Patient continues to have bilateral hip pain. She states that she was seen by ortho and was told that she was a candidate for bilateral hip replacement. States that she also has leg cramping.   Review of Systems  Constitutional: Negative.   HENT: Negative.   Eyes: Negative.   Respiratory: Negative.   Cardiovascular: Negative.   Gastrointestinal: Negative.   Genitourinary: Negative.   Musculoskeletal: Positive for back pain, joint pain and myalgias.  Skin: Negative.   Neurological: Positive for tingling.  Psychiatric/Behavioral: Negative.      OBJECTIVE: BP (!) 148/82 (BP Location: Right Arm, Patient Position: Sitting, Cuff Size: Large) Comment: manually  Pulse 92   Temp 98.4 F (36.9 C) (Oral)   Resp 14   Ht 5\' 5"  (1.761 m)   Wt 170 lb (77.1 kg)   SpO2 100%   BMI 28.29 kg/m   Wt Readings from Last 3 Encounters:  09/05/18 170 lb (77.1 kg)  05/23/18 173 lb (78.5 kg)  02/20/18 168 lb (76.2 kg)     Physical Exam Vitals signs and nursing note reviewed.  Constitutional:      General: She is not in acute distress.    Appearance: She is well-developed.  HENT:     Head: Normocephalic and atraumatic.  Eyes:   Conjunctiva/sclera: Conjunctivae normal.     Pupils: Pupils are equal, round, and reactive to light.  Neck:     Musculoskeletal: Normal range of motion.  Cardiovascular:     Rate and Rhythm: Normal rate and regular rhythm.     Heart sounds: Normal heart sounds.  Pulmonary:     Effort: Pulmonary effort is normal. No respiratory distress.     Breath sounds: Normal breath sounds.  Abdominal:     General: Bowel sounds are normal. There is no distension.     Palpations: Abdomen is soft.  Musculoskeletal: Normal range of motion.        General: Tenderness (left wrist) present. No swelling.  Skin:    General: Skin is warm and dry.  Neurological:     Mental Status: She is alert and oriented to person, place, and time.  Psychiatric:        Behavior: Behavior normal.        Thought Content: Thought content normal.     ASSESSMENT/PLAN:  1. Essential hypertension D/c HCTZ due to cramping. Will have patient come for nurse visit in 2 weeks for HTN check. Follow up in 3 months after that - Urinalysis Dipstick - Comprehensive metabolic panel  2. Muscle spasm D/c hctz. Refilled flexeril. Continue with otc muscle rubs.  - Comprehensive metabolic panel  3. Night muscle spasms - cyclobenzaprine (FLEXERIL) 10 MG tablet; Take 1 tablet QHS PRN  Dispense: 90 tablet; Refill: 1  4. Bilateral hip pain -  DG HIPS BILAT WITH PELVIS 2V; Future   Increase fish oil to up to 4 grams per day. Continue with tumeric for inflammation. Hip x rays ordered today.   Return in about 3 months (around 12/05/2018) for 2 week BP check.    The patient was given clear instructions to go to ER or return to medical center if symptoms do not improve, worsen or new problems develop. The patient verbalized understanding and agreed with plan of care.   Ms. Doug Sou. Nathaneil Canary, FNP-BC Patient [facility] [address] [place], [place] [address] [phone]

## 2018-09-05 NOTE — Patient Instructions (Signed)

## 2018-09-06 LAB — COMPREHENSIVE METABOLIC PANEL
ALT: 23 IU/L (ref 0–32)
AST: 20 IU/L (ref 0–40)
Albumin/Globulin Ratio: 1.7 (ref 1.2–2.2)
Albumin: 4.5 g/dL (ref 3.6–4.8)
Alkaline Phosphatase: 65 IU/L (ref 39–117)
BUN/Creatinine Ratio: 16 (ref 12–28)
BUN: 11 mg/dL (ref 8–27)
Bilirubin Total: 0.4 mg/dL (ref 0.0–1.2)
CO2: 20 mmol/L (ref 20–29)
Calcium: 9.7 mg/dL (ref 8.7–10.3)
Chloride: 102 mmol/L (ref 96–106)
Creatinine, Ser: 0.68 mg/dL (ref 0.57–1.00)
GFR calc Af Amer: 106 mL/min/{1.73_m2} (ref >59)
GFR calc non Af Amer: 92 mL/min/{1.73_m2} (ref >59)
Globulin, Total: 2.7 g/dL (ref 1.5–4.5)
Glucose: 128 mg/dL — ABNORMAL HIGH (ref 65–99)
Potassium: 3.5 mmol/L (ref 3.5–5.2)
Sodium: 144 mmol/L (ref 134–144)
Total Protein: 7.2 g/dL (ref 6.0–8.5)

## 2018-09-09 ENCOUNTER — Telehealth: Payer: Self-pay

## 2018-09-09 NOTE — Telephone Encounter (Signed)
-----   Message from Lanae Boast, [place] sent at 09/09/2018 12:09 PM EST ----- The  labs are stable without significant clinical change.  All other results are normal or within acceptable limits. No Medication changes

## 2018-09-09 NOTE — Telephone Encounter (Signed)
Called and spoke with patient, advised that all labs and results are normal. Thanks!

## 2018-09-15 ENCOUNTER — Other Ambulatory Visit: Payer: Self-pay | Admitting: Family Medicine

## 2018-09-15 DIAGNOSIS — K219 Gastro-esophageal reflux disease without esophagitis: Secondary | ICD-10-CM

## 2018-09-19 ENCOUNTER — Ambulatory Visit: Payer: Medicare Other

## 2018-09-19 VITALS — BP 160/82

## 2018-09-19 DIAGNOSIS — I10 Essential (primary) hypertension: Secondary | ICD-10-CM

## 2018-10-03 ENCOUNTER — Other Ambulatory Visit: Payer: Self-pay

## 2018-10-03 ENCOUNTER — Ambulatory Visit: Payer: Medicare Other

## 2018-10-03 VITALS — BP 168/90

## 2018-10-03 DIAGNOSIS — I10 Essential (primary) hypertension: Secondary | ICD-10-CM

## 2018-10-03 MED ORDER — METOPROLOL SUCCINATE ER 25 MG PO TB24: ORAL_TABLET | ORAL | 3 refills | Status: DC

## 2018-10-17 ENCOUNTER — Encounter: Payer: Self-pay | Admitting: Family Medicine

## 2018-10-17 ENCOUNTER — Ambulatory Visit (INDEPENDENT_AMBULATORY_CARE_PROVIDER_SITE_OTHER): Payer: Medicare Other | Admitting: Family Medicine

## 2018-10-17 VITALS — BP 158/92 | HR 92 | Temp 98.1°F | Resp 18 | Ht 65.0 in | Wt 168.0 lb

## 2018-10-17 DIAGNOSIS — I10 Essential (primary) hypertension: Secondary | ICD-10-CM | POA: Diagnosis not present

## 2018-10-17 DIAGNOSIS — B070 Plantar wart: Secondary | ICD-10-CM | POA: Diagnosis not present

## 2018-10-17 NOTE — Patient Instructions (Signed)
Hypertension Hypertension is another name for high blood pressure. High blood pressure forces your heart to work harder to pump blood. This can cause problems over time. There are two numbers in a blood pressure reading. There is a top number (systolic) over a bottom number (diastolic). It is best to have a blood pressure below 120/80. Healthy choices can help lower your blood pressure. You may need medicine to help lower your blood pressure if:  Your blood pressure cannot be lowered with healthy choices.  Your blood pressure is higher than 130/80. Follow these instructions at home: Eating and drinking   If directed, follow the DASH eating plan. This diet includes: ? Filling half of your plate at each meal with fruits and vegetables. ? Filling one quarter of your plate at each meal with whole grains. Whole grains include whole wheat pasta, brown rice, and whole grain bread. ? Eating or drinking low-fat dairy products, such as skim milk or low-fat yogurt. ? Filling one quarter of your plate at each meal with low-fat (lean) proteins. Low-fat proteins include fish, skinless chicken, eggs, beans, and tofu. ? Avoiding fatty meat, cured and processed meat, or chicken with skin. ? Avoiding premade or processed food.  Eat less than 1,500 mg of salt (sodium) a day.  Limit alcohol use to no more than 1 drink a day for nonpregnant women and 2 drinks a day for men. One drink equals 12 oz of beer, 5 oz of wine, or 1 oz of hard liquor. Lifestyle  Work with your doctor to stay at a healthy weight or to lose weight. Ask your doctor what the best weight is for you.  Get at least 30 minutes of exercise that causes your heart to beat faster (aerobic exercise) most days of the week. This may include walking, swimming, or biking.  Get at least 30 minutes of exercise that strengthens your muscles (resistance exercise) at least 3 days a week. This may include lifting weights or pilates.  Do not use any  products that contain nicotine or tobacco. This includes cigarettes and e-cigarettes. If you need help quitting, ask your doctor.  Check your blood pressure at home as told by your doctor.  Keep all follow-up visits as told by your doctor. This is important. Medicines  Take over-the-counter and prescription medicines only as told by your doctor. Follow directions carefully.  Do not skip doses of blood pressure medicine. The medicine does not work as well if you skip doses. Skipping doses also puts you at risk for problems.  Ask your doctor about side effects or reactions to medicines that you should watch for. Contact a doctor if:  You think you are having a reaction to the medicine you are taking.  You have headaches that keep coming back (recurring).  You feel dizzy.  You have swelling in your ankles.  You have trouble with your vision. Get help right away if:  You get a very bad headache.  You start to feel confused.  You feel weak or numb.  You feel faint.  You get very bad pain in your: ? Chest. ? Belly (abdomen).  You throw up (vomit) more than once.  You have trouble breathing. Summary  Hypertension is another name for high blood pressure.  Making healthy choices can help lower blood pressure. If your blood pressure cannot be controlled with healthy choices, you may need to take medicine. This information is not intended to replace advice given to you by your health care   provider. Make sure you discuss any questions you have with your health care provider. Document Released: 02/06/2008 Document Revised: 07/18/2016 Document Reviewed: 07/18/2016 Elsevier Interactive Patient Education  2019 Elsevier Inc. Plantar Warts Plantar warts are small growths on the bottom of the foot (sole). Warts are caused by a type of germ (virus). Most warts are not painful, and they usually do not cause problems. Sometimes, plantar warts can cause pain when you walk. Warts often go  away on their own in time. They can also spread to other areas of the body. Treatments may be done if needed. What are the causes?  Plantar warts are caused by a germ that is called human papillomavirus (HPV). ? Walking barefoot can cause exposure to the germ, especially if your feet are wet. ? Warts happen when HPV attacks a break in the skin of the foot. What increases the risk?  Being between 51-34 years of age.  Using public showers or locker rooms.  Having a weakened body defense system (immune system). What are the signs or symptoms?   Flat or slightly raised growths that have a rough surface and look like a callus.  Pain when you use your foot to support your body weight. How is this treated? In many cases, warts do not need treatment. Without treatment, they often go away with time. If treatment is needed or wanted, options may include:  Applying medicated solutions, creams, or patches to the wart. These make the skin soft so that layers will slowly shed away.  Freezing the wart with liquid nitrogen (cryotherapy).  Burning the wart with: ? Laser treatment. ? An electrified probe (electrocautery).  Injecting a medicine (Candida antigen) into the wart to help the body's defense system fight off the wart.  Having surgery to remove the wart.  Putting duct tape over the top of the wart (occlusion). You will leave the tape in place for as long as told by your doctor. Then you will replace it with a new strip of tape. This is done until the wart goes away. Repeat treatment may be needed if you choose to remove warts. Warts sometimes go away and come back again. Follow these instructions at home: General instructions  Apply creams or solutions only as told by your doctor. Follow these steps if your doctor tells you to do so: ? Soak your foot in warm water. ? Remove the top layer of softened skin before you apply the medicine. You can use a pumice stone to remove the  skin. ? After you apply the medicine, put a bandage over the area of the wart. ? Repeat the process every day or as told by your doctor.  Do not scratch or pick at a wart.  Wash your hands after you touch a wart.  If a wart hurts, try covering it with a bandage that has a hole in the middle.  Keep all follow-up visits as told by your doctor. This is important. How is this prevented?   Wear shoes and socks. Change your socks every day.  Keep your feet clean and dry.  Check your feet often.  Do not walk barefoot in: ? Shared locker rooms. ? Shower areas. ? Swimming pools.  Avoid direct contact with warts on other people. Contact a doctor if:  Your warts do not improve after treatment.  You have redness, swelling, or pain at the site of a wart.  You have bleeding from a wart, and the bleeding does not stop when you  put light pressure on the wart.  You have diabetes and you get a wart. Summary  Warts are small growths on the skin.  When warts happen on the bottom of the foot (sole), they are called plantar warts.  In many cases, warts do not need treatment.  Apply creams or solutions only as told by your doctor.  Do not scratch or pick at a wart. Wash your hands after you touch a wart. This information is not intended to replace advice given to you by your health care provider. Make sure you discuss any questions you have with your health care provider. Document Released: 09/22/2010 Document Revised: 03/18/2018 Document Reviewed: 11/15/2014 Elsevier Interactive Patient Education  2019 [employer].

## 2018-10-17 NOTE — Progress Notes (Signed)
Patient [facility]   Progress Note: General Provider: Lanae Boast, FNP  SUBJECTIVE:   Margaret SUNDE is a 68 y.o. female who  has a past medical history of Anxiety, Blood transfusion without reported diagnosis (1982), GERD (gastroesophageal reflux disease), Hepatitis C, hepatitis C (2010), Hyperlipidemia, Hypertension, and Nerve damage (2001).. Patient presents today for Foot Problem ("bump" on bottom of left foot ) and Follow-up (question about hip xrays ) Patient continues to have elevated BP readings. She has sensitivity to antihypertensives and states that they worsen her muscle spasms.  Patient also reports having a bump on the left foot that is painful with weight bearing. She states a history of plantar warts. Denies chest pain, SOB, dizziness and leg swelling. She reports being physcially active most days. She continues to smoke 1/2 ppd. Not currently interested in quitting.  Review of Systems  Constitutional: Negative.   HENT: Negative.   Eyes: Negative.   Respiratory: Negative.   Cardiovascular: Negative.   Gastrointestinal: Negative.   Genitourinary: Negative.   Musculoskeletal: Negative.   Skin:       Place on left foot    Neurological: Negative.   Psychiatric/Behavioral: Negative.      OBJECTIVE: BP (!) 158/92 Comment: MANUALLY  Pulse 92   Temp 98.1 F (36.7 C) (Oral)   Resp 18   Ht 5\' 5"  (1.651 m)   Wt 168 lb (76.2 kg)   SpO2 99%   BMI 27.96 kg/m   Wt Readings from Last 3 Encounters:  10/17/18 168 lb (76.2 kg)  09/05/18 170 lb (77.1 kg)  05/23/18 173 lb (78.5 kg)     Physical Exam Vitals signs and nursing note reviewed.  Constitutional:      General: She is not in acute distress.    Appearance: She is well-developed.  HENT:     Head: Normocephalic and atraumatic.  Eyes:     Conjunctiva/sclera: Conjunctivae normal.     Pupils: Pupils are equal, round, and reactive to light.  Neck:     Musculoskeletal:  Normal range of motion.  Cardiovascular:     Rate and Rhythm: Normal rate and regular rhythm.     Heart sounds: Normal heart sounds.  Pulmonary:     Effort: Pulmonary effort is normal. No respiratory distress.     Breath sounds: Normal breath sounds.  Abdominal:     General: Bowel sounds are normal. There is no distension.     Palpations: Abdomen is soft.  Musculoskeletal: Normal range of motion.       Feet:  Skin:    General: Skin is warm and dry.  Neurological:     Mental Status: She is alert and oriented to person, place, and time.  Psychiatric:        Behavior: Behavior normal.        Thought Content: Thought content normal.     ASSESSMENT/PLAN: 1. Essential hypertension Patient is fearful to change medications at the present time. Referred to cardiology for further evaluation.  - Ambulatory referral to Cardiology  2. Plantar wart, left foot - Ambulatory referral to Podiatry    Return in about 3 months (around 01/15/2019), or if symptoms worsen or fail to improve, for htn.    The patient was given clear instructions to go to ER or return to medical center if symptoms do not improve, worsen or new problems develop. The patient verbalized understanding and agreed with plan of care.   Ms. Doug Sou. Nathaneil Canary, FNP-BC  Patient [facility] [address] [place], [place] [address] [phone]

## 2018-10-30 ENCOUNTER — Encounter: Payer: Self-pay | Admitting: Podiatry

## 2018-10-30 ENCOUNTER — Ambulatory Visit (INDEPENDENT_AMBULATORY_CARE_PROVIDER_SITE_OTHER): Payer: Medicare Other | Admitting: Podiatry

## 2018-10-30 ENCOUNTER — Ambulatory Visit (INDEPENDENT_AMBULATORY_CARE_PROVIDER_SITE_OTHER): Payer: Medicare Other

## 2018-10-30 VITALS — BP 174/106 | HR 78 | Resp 16

## 2018-10-30 DIAGNOSIS — M7741 Metatarsalgia, right foot: Secondary | ICD-10-CM | POA: Diagnosis not present

## 2018-10-30 DIAGNOSIS — M216X2 Other acquired deformities of left foot: Secondary | ICD-10-CM

## 2018-10-30 DIAGNOSIS — R29898 Other symptoms and signs involving the musculoskeletal system: Secondary | ICD-10-CM

## 2018-10-30 DIAGNOSIS — M216X9 Other acquired deformities of unspecified foot: Secondary | ICD-10-CM

## 2018-10-30 DIAGNOSIS — M779 Enthesopathy, unspecified: Secondary | ICD-10-CM

## 2018-10-30 DIAGNOSIS — M778 Other enthesopathies, not elsewhere classified: Secondary | ICD-10-CM

## 2018-10-30 DIAGNOSIS — M7742 Metatarsalgia, left foot: Secondary | ICD-10-CM | POA: Diagnosis not present

## 2018-10-30 DIAGNOSIS — L909 Atrophic disorder of skin, unspecified: Secondary | ICD-10-CM

## 2018-10-30 NOTE — Patient Instructions (Signed)
Achilles Tendinitis  Achilles tendinitis is inflammation of the tough, cord-like band that attaches the lower leg muscles to the heel bone (Achilles tendon). This is usually caused by overusing the tendon and the ankle joint. Achilles tendinitis usually gets better over time with treatment and caring for yourself at home. It can take weeks or months to heal completely. What are the causes? This condition may be caused by:  A sudden increase in exercise or activity, such as running.  Doing the same exercises or activities (such as jumping) over and over.  Not warming up calf muscles before exercising.  Exercising in shoes that are worn out or not made for exercise.  Having arthritis or a bone growth (spur) on the back of the heel bone. This can rub against the tendon and hurt it.  Age-related wear and tear. Tendons become less flexible with age and more likely to be injured. What are the signs or symptoms? Common symptoms of this condition include:  Pain in the Achilles tendon or in the back of the leg, just above the heel. The pain usually gets worse with exercise.  Stiffness or soreness in the back of the leg, especially in the morning.  Swelling of the skin over the Achilles tendon.  Thickening of the tendon.  Bone spurs at the bottom of the Achilles tendon, near the heel.  Trouble standing on tiptoe. How is this diagnosed? This condition is diagnosed based on your symptoms and a physical exam. You may have tests, including:  X-rays.  MRI. How is this treated? The goal of treatment is to relieve symptoms and help your injury heal. Treatment may include:  Decreasing or stopping activities that caused the tendinitis. This may mean switching to low-impact exercises like biking or swimming.  Icing the injured area.  Doing physical therapy, including strengthening and stretching exercises.  NSAIDs to help relieve pain and swelling.  Using supportive shoes, wraps, heel  lifts, or a walking boot (air cast).  Surgery. This may be done if your symptoms do not improve after 6 months.  Using high-energy shock wave impulses to stimulate the healing process (extracorporeal shock wave therapy). This is rare.  Injection of medicines to help relieve inflammation (corticosteroids). This is rare. Follow these instructions at home: If you have an air cast:  Wear the cast as told by your health care provider. Remove it only as told by your health care provider.  Loosen the cast if your toes tingle, become numb, or turn cold and blue. Activity  Gradually return to your normal activities once your health care provider approves. Do not do activities that cause pain. ? Consider doing low-impact exercises, like cycling or swimming.  If you have an air cast, ask your health care provider when it is safe for you to drive.  If physical therapy was prescribed, do exercises as told by your health care provider or physical therapist. Managing pain, stiffness, and swelling   Raise (elevate) your foot above the level of your heart while you are sitting or lying down.  Move your toes often to avoid stiffness and to lessen swelling.  If directed, put ice on the injured area: ? Put ice in a plastic bag. ? Place a towel between your skin and the bag. ? Leave the ice on for 20 minutes, 2-3 times a day General instructions  If directed, wrap your foot with an elastic bandage or other wrap. This can help keep your tendon from moving too much while it   heals. Your health care provider will show you how to wrap your foot correctly.  Wear supportive shoes or heel lifts only as told by your health care provider.  Take over-the-counter and prescription medicines only as told by your health care provider.  Keep all follow-up visits as told by your health care provider. This is important. Contact a health care provider if:  You have symptoms that gets worse.  You have pain that  does not get better with medicine.  You develop new, unexplained symptoms.  You develop warmth and swelling in your foot.  You have a fever. Get help right away if:  You have a sudden popping sound or sensation in your Achilles tendon followed by severe pain.  You cannot move your toes or foot.  You cannot put any weight on your foot. Summary  Achilles tendinitis is inflammation of the tough, cord-like band that attaches the lower leg muscles to the heel bone (Achilles tendon).  This condition is usually caused by overusing the tendon and the ankle joint. It can also be caused by arthritis or normal aging.  The most common symptoms of this condition include pain, swelling, or stiffness in the Achilles tendon or in the back of the leg.  This condition is usually treated with rest, NSAIDs, and physical therapy. This information is not intended to replace advice given to you by your health care provider. Make sure you discuss any questions you have with your health care provider. Document Released: 05/30/2005 Document Revised: 07/09/2016 Document Reviewed: 07/09/2016 Elsevier Interactive Patient Education  2019 Elsevier Inc.   

## 2018-10-30 NOTE — Progress Notes (Addendum)
Subjective:  Patient ID: Margaret Kelly, female    DOB: May 24, 1951,  MRN: [id number]  Chief Complaint  Patient presents with  . Foot Pain    Plantar forefoot left - tenderness x 2 weeks, noticed swelling and a knot but has went down now, also callused area she says is a wart that was frozen off years ago, walking increases pain, no treatment  . New Patient (Initial Visit)    68 y.o. female presents with the above complaint.  History above confirmed with patient  Review of Systems: Negative except as noted in the HPI. Denies N/V/F/Ch.  Past Medical History:  Diagnosis Date  . Anxiety   . Blood transfusion without reported diagnosis 1982  . GERD (gastroesophageal reflux disease)   . Hepatitis C   . Hx of hepatitis C 2010  . Hyperlipidemia   . Hypertension   . Nerve damage 2001   left side as result of fall    Current Outpatient Medications:  .  OVER THE COUNTER MEDICATION, Cayenne/Turmeric/Cumin combo capsule, Disp: , Rfl:  .  Blood Pressure Monitoring DEVI, 1 each by Does not apply route 2 (two) times daily., Disp: 1 Device, Rfl: 0 .  cyclobenzaprine (FLEXERIL) 10 MG tablet, Take 1 tablet QHS PRN, Disp: 90 tablet, Rfl: 1 .  fenofibrate (TRICOR) 145 MG tablet, TAKE 1 TABLET(145 MG) BY MOUTH DAILY (Patient not taking: Reported on 10/03/2018), Disp: 90 tablet, Rfl: 3 .  losartan (COZAAR) 100 MG tablet, TAKE 1 TABLET BY MOUTH DAILY, Disp: 90 tablet, Rfl: 3 .  Magnesium Citrate 200 MG TABS, Take 400 each by mouth., Disp: , Rfl:  .  Melatonin 10 MG CAPS, Take by mouth. Takes 2 tablets at bedtime, Disp: , Rfl:  .  metoprolol succinate (TOPROL-XL) 25 MG 24 hr tablet, TAKE 1 TABLET(25 MG) BY MOUTH DAILY, Disp: 90 tablet, Rfl: 3 .  Multiple Vitamin (MULTIVITAMIN) tablet, Take 1 tablet by mouth daily., Disp: , Rfl:  .  Omega-3 Fatty Acids (FISH OIL) 1200 MG CAPS, Take by mouth daily., Disp: , Rfl:  .  omeprazole (PRILOSEC) 40 MG capsule, TAKE 1 CAPSULE(40 MG) BY MOUTH DAILY, Disp: 90  capsule, Rfl: 1 .  Potassium 99 MG TABS, Take by mouth., Disp: , Rfl:   Social History   Tobacco Use  Smoking Status Former Smoker  . Packs/day: 0.50  . Types: Cigarettes  Smokeless Tobacco Current User  Tobacco Comment   using patch.     Allergies  Allergen Reactions  . Amlodipine Other (See Comments)    Ringing in ears   Objective:   Vitals:   10/30/18 1111  BP: (!) 174/106  Pulse: 78  Resp: 16   There is no height or weight on file to calculate BMI. Constitutional Well developed. Well nourished.  Vascular Dorsalis pedis pulses palpable bilaterally. Posterior tibial pulses palpable bilaterally. Capillary refill normal to all digits.  No cyanosis or clubbing noted. Pedal hair growth normal.  Neurologic Normal speech. Oriented to person, place, and time. Epicritic sensation to light touch grossly present bilaterally.  Dermatologic Nails normal Skin hyperkeratotic lesion sub-met 2-3, 5 bilaterally  Orthopedic: Normal joint ROM without pain or crepitus bilaterally. No visible deformities. Pain to palpation sub-met 3 left Ankle equinus noted   Radiographs: Taken reviewed slightly elongated second third metatarsals Assessment:   1. Capsulitis of foot, left   2. Fat pad atrophy of foot   3. Metatarsalgia of both feet   4. Equinus deformity of foot    Plan:  Patient was evaluated and treated and all questions answered.  Metatarsalgia, plantar fat pad atrophy, metatarsal deformity, equinus -X-rays reviewed with patient -Discussed symptoms due to plantar fat pad atrophy prominent metatarsals. -Discussed padding proper shoe gear -Lesions debrided courtesy for patient.  Advised that further care of her calluses would be subject to noncoverage -Night splints dispensed for stretching the Achilles tendon to prevent forefoot pressure  No follow-ups on file.

## 2018-11-11 DIAGNOSIS — D3132 Benign neoplasm of left choroid: Secondary | ICD-10-CM | POA: Diagnosis not present

## 2018-11-11 DIAGNOSIS — H25013 Cortical age-related cataract, bilateral: Secondary | ICD-10-CM | POA: Diagnosis not present

## 2018-11-11 DIAGNOSIS — H35033 Hypertensive retinopathy, bilateral: Secondary | ICD-10-CM | POA: Diagnosis not present

## 2018-11-11 DIAGNOSIS — H2513 Age-related nuclear cataract, bilateral: Secondary | ICD-10-CM | POA: Diagnosis not present

## 2018-11-12 ENCOUNTER — Ambulatory Visit (INDEPENDENT_AMBULATORY_CARE_PROVIDER_SITE_OTHER): Payer: Medicare Other | Admitting: Cardiology

## 2018-11-12 ENCOUNTER — Encounter: Payer: Self-pay | Admitting: Cardiology

## 2018-11-12 ENCOUNTER — Other Ambulatory Visit: Payer: Self-pay

## 2018-11-12 VITALS — BP 170/102 | HR 90 | Ht 65.0 in | Wt 172.0 lb

## 2018-11-12 DIAGNOSIS — Z716 Tobacco abuse counseling: Secondary | ICD-10-CM | POA: Diagnosis not present

## 2018-11-12 DIAGNOSIS — I10 Essential (primary) hypertension: Secondary | ICD-10-CM | POA: Diagnosis not present

## 2018-11-12 DIAGNOSIS — E785 Hyperlipidemia, unspecified: Secondary | ICD-10-CM

## 2018-11-12 MED ORDER — ROSUVASTATIN CALCIUM 5 MG PO TABS: 5.0000 mg | ORAL_TABLET | ORAL | 3 refills | Status: DC

## 2018-11-12 MED ORDER — CARVEDILOL 25 MG PO TABS: 25.0000 mg | ORAL_TABLET | Freq: Two times a day (BID) | ORAL | 3 refills | Status: DC

## 2018-11-12 NOTE — Progress Notes (Signed)
Cardiology Office Note    Date:  11/12/2018   ID:  Margaret Kelly, DOB 10/13/1950, MRN [id number]  PCP:  Lanae Boast, FNP  Cardiologist:  Fransico Him, MD   Chief Complaint  Patient presents with  . Hypertension    History of Present Illness:  Margaret Kelly is a 68 y.o. female who is being seen today for the evaluation of hypertension at the request of Lanae Boast, [place].  This is a 68 year old female with a history of hepatitis C, hyperlipidemia, hypertension and GERD.  She has been having difficult to control blood pressures and is now referred for further evaluation with cardiology.  She is on multiple blood pressure medicines including losartan 100 mg daily, Toprol-XL 50 mg daily and has not been able to get blood pressure under better control.  She was intolerant to lisinopril due to cough and HCTZ caused severe dehydration.  She is here today for followup and is doing well.  She denies any chest pain or pressure, SOB, DOE, PND, orthopnea, LE edema, dizziness, palpitations or syncope. She is compliant with her meds and is tolerating meds with no SE.    Past Medical History:  Diagnosis Date  . Anxiety   . Blood transfusion without reported diagnosis 1982  . GERD (gastroesophageal reflux disease)   . History of hepatitis C 10/13/2015  . Hyperlipidemia LDL goal <100 10/13/2015  . Hypertension   . Muscle spasm of left lower extremity 10/13/2015  . Nerve damage 2001   left side as result of fall  . Pain of left hip joint 02/21/2018  . Plantar wart     Past Surgical History:  Procedure Laterality Date  . ABDOMINAL HYSTERECTOMY  1982  . CARPAL TUNNEL RELEASE Left 2005  . foot spur Left 2007   foot    Current Medications: Current Meds  Medication Sig  . cyclobenzaprine (FLEXERIL) 10 MG tablet Take 1 tablet QHS PRN  . losartan (COZAAR) 100 MG tablet TAKE 1 TABLET BY MOUTH DAILY  . Magnesium Citrate 200 MG TABS Take 400 each by mouth.  . Melatonin 10 MG CAPS Take by  mouth. Takes 2 tablets at bedtime  . metoprolol succinate (TOPROL-XL) 50 MG 24 hr tablet Take 50 mg by mouth daily. Take with or immediately following a meal.  . Multiple Vitamin (MULTIVITAMIN) tablet Take 1 tablet by mouth daily.  . Omega-3 Fatty Acids (FISH OIL) 1200 MG CAPS Take by mouth daily.  Marland Kitchen omeprazole (PRILOSEC) 40 MG capsule TAKE 1 CAPSULE(40 MG) BY MOUTH DAILY  . OVER THE COUNTER MEDICATION Cayenne/Turmeric/Cumin combo capsule    Allergies:   Amlodipine   Social History   Socioeconomic History  . Marital status: Divorced    Spouse name: Not on file  . Number of children: Not on file  . Years of education: Not on file  . Highest education level: Not on file  Occupational History  . Not on file  Social Needs  . Financial resource strain: Not on file  . Food insecurity:    Worry: Not on file    Inability: Not on file  . Transportation needs:    Medical: Not on file    Non-medical: Not on file  Tobacco Use  . Smoking status: Former Smoker    Packs/day: 0.50    Types: Cigarettes  . Smokeless tobacco: Current User  . Tobacco comment: using patch.   Substance and Sexual Activity  . Alcohol use: Yes    Comment: rare  .  Drug use: No  . Sexual activity: Not on file  Lifestyle  . Physical activity:    Days per week: Not on file    Minutes per session: Not on file  . Stress: Not on file  Relationships  . Social connections:    Talks on phone: Not on file    Gets together: Not on file    Attends religious service: Not on file    Active member of club or organization: Not on file    Attends meetings of clubs or organizations: Not on file    Relationship status: Not on file  Other Topics Concern  . Not on file  Social History Narrative  . Not on file     Family History:  The patient's family history is not on file.   ROS:   Please see the history of present illness.    ROS All other systems reviewed and are negative.  No flowsheet data found.      PHYSICAL EXAM:   VS:  BP (!) 170/102   Pulse 90   Ht 5\' 5"  (1.651 m)   Wt 172 lb (78 kg)   SpO2 96%   BMI 28.62 kg/m    GEN: Well nourished, well developed, in no acute distress  HEENT: normal  Neck: no JVD, carotid bruits, or masses Cardiac: RRR; no murmurs, rubs, or gallops,no edema.  Intact distal pulses bilaterally.  Respiratory:  clear to auscultation bilaterally, normal work of breathing GI: soft, nontender, nondistended, + BS MS: no deformity or atrophy  Skin: warm and dry, no rash Neuro:  Alert and Oriented x 3, Strength and sensation are intact Psych: euthymic mood, full affect  Wt Readings from Last 3 Encounters:  11/12/18 172 lb (78 kg)  10/17/18 168 lb (76.2 kg)  09/05/18 170 lb (77.1 kg)      Studies/Labs Reviewed:   EKG:  EKG is not  ordered today.    Recent Labs: 09/05/2018: ALT 23; BUN 11; Creatinine, Ser 0.68; Potassium 3.5; Sodium 144   Lipid Panel    Component Value Date/Time   CHOL 261 (H) 05/23/2018 1137   TRIG 151 (H) 05/23/2018 1137   HDL 60 05/23/2018 1137   CHOLHDL 4.4 05/23/2018 1137   CHOLHDL 4.5 03/22/2017 1116   VLDL 40 (H) 03/22/2017 1116   LDLCALC 171 (H) 05/23/2018 1137    Additional studies/ records that were reviewed today include:  Office notes from PCP    ASSESSMENT:    1. Essential hypertension   2. Tobacco abuse counseling   3. Hyperlipidemia LDL goal <100      PLAN:  In order of problems listed above:  1.  Hypertension - blood pressure is poorly controlled on exam today.  It is 170/102 mmHg.  She has been intolerant to ace inhibitors and now is on orbit full dose.  I am going to continue her losartan 100 mg daily.  Her heart rate is in the 90s so she would tolerate an increased dose of her beta-blocker but I am not sure her blood pressure will get adequately controlled on Toprol.  I think a better beta-blocker option for her would be carvedilol which I will start at 25 mg twice daily.  She will follow-up in  hypertension clinic in 1 to 2 weeks.  2.  Tobacco abuse counseling - she wants to try to quit smoking cigarettes.  She also smokes marijuana for pain and does not want to stop that.  She is interested in trying  Chantix but cannot afford it.  I am going to check with our pharmacist to see if we have any options for being able to get it for her for free.  3.  Hyperlipidemia - her LDL goal is less than 100.  Her last LDL was 171.  She had been on simvastatin but was intolerant due to worsening muscle aches.  I am going to try her on Crestor 5 mg q. weekly and repeat an FLP and ALT in 6 weeks.   Medication Adjustments/Labs and Tests Ordered: Current medicines are reviewed at length with the patient today.  Concerns regarding medicines are outlined above.  Medication changes, Labs and Tests ordered today are listed in the Patient Instructions below.  There are no Patient Instructions on file for this visit.   Signed, Fransico Him, MD  11/12/2018 1:47 PM    [address], [place], [place]  [address] Phone: [phone]; Fax: [phone]

## 2018-11-12 NOTE — Patient Instructions (Addendum)
Medication Instructions:  Stop: Toprol Start: Carvedilol 25 mg, twice a day Start: Crestor 5 mg, weekly  If you need a refill on your cardiac medications before your next appointment, please call your pharmacy.   Lab work: Today: BMET and Magnesium  Future: Fasting labs on 12/29/18, Lipid and Liver   If you have labs (blood work) drawn today and your tests are completely normal, you will receive your results only by: Marland Kitchen MyChart Message (if you have MyChart) OR . A paper copy in the mail If you have any lab test that is abnormal or we need to change your treatment, we will call you to review the results.  Testing/Procedures: None  Follow-Up: Your physician recommends that you schedule a follow-up appointment in: 3 months with the PA.    Your physician recommends that you schedule a follow-up appointment in: 1-2 weeks with the [facility] Pressure Clinic

## 2018-11-13 LAB — MAGNESIUM: Magnesium: 1.5 mg/dL — ABNORMAL LOW (ref 1.6–2.3)

## 2018-11-13 LAB — BASIC METABOLIC PANEL
BUN / CREAT RATIO: 19 (ref 12–28)
BUN: 15 mg/dL (ref 8–27)
CO2: 26 mmol/L (ref 20–29)
Calcium: 10.1 mg/dL (ref 8.7–10.3)
Chloride: 105 mmol/L (ref 96–106)
Creatinine, Ser: 0.8 mg/dL (ref 0.57–1.00)
GFR calc non Af Amer: 78 mL/min/{1.73_m2} (ref >59)
GFR, EST AFRICAN AMERICAN: 89 mL/min/{1.73_m2} (ref >59)
Glucose: 104 mg/dL — ABNORMAL HIGH (ref 65–99)
Potassium: 4.3 mmol/L (ref 3.5–5.2)
Sodium: 145 mmol/L — ABNORMAL HIGH (ref 134–144)

## 2018-11-19 ENCOUNTER — Telehealth: Payer: Self-pay | Admitting: Cardiology

## 2018-11-19 NOTE — Telephone Encounter (Signed)
Spoke with Pharmacy, the patient is supposed to attend her appointment. They already spoke with the patient. She also updated that [facility] will contact our office once her chantix is ready to be ordered.

## 2018-11-19 NOTE — Telephone Encounter (Signed)
New Message:     Pt wants Dr  Radford Pax to know that she found the Chantix. She said [employer] will be contacting her.

## 2018-11-20 ENCOUNTER — Telehealth: Payer: Self-pay | Admitting: Cardiology

## 2018-11-20 DIAGNOSIS — R790 Abnormal level of blood mineral: Secondary | ICD-10-CM

## 2018-11-20 NOTE — Telephone Encounter (Signed)
° °  Patient calling to verify is she needs to come in sooner than 4/27 to have labs.    Notes recorded by Michae Kava, CMA on 11/17/2018 at 2:13 PM EDT DPR ok to lvm. Left message to go over lab results and recommendations for repeat labs. ------  Notes recorded by [name], MD on 11/16/2018 at 11:20 PM EDT Magnesium is low - please change to magnesium oxide 400mg  BID and repeat mag level in 1 week

## 2018-11-21 MED ORDER — MAGNESIUM CITRATE 200 MG PO TABS: 400.0000 mg | ORAL_TABLET | Freq: Two times a day (BID) | ORAL | Status: DC

## 2018-11-21 NOTE — Telephone Encounter (Signed)
Spoke with the patient, she is coming to the office for a visit with the pharmacist, she will get the labs the same day on 11/26/18. She had no further questions.

## 2018-11-26 ENCOUNTER — Other Ambulatory Visit: Payer: Medicare Other

## 2018-11-26 ENCOUNTER — Ambulatory Visit: Payer: Medicare Other

## 2018-12-05 ENCOUNTER — Ambulatory Visit: Payer: Self-pay | Admitting: Family Medicine

## 2018-12-09 ENCOUNTER — Telehealth: Payer: Self-pay | Admitting: Pharmacist

## 2018-12-09 NOTE — Telephone Encounter (Signed)
Patient ID: Margaret Kelly                 DOB: Dec 25, 1950                      MRN: [id number]     HPI: Margaret Kelly is a 68 y.o. female referred by Dr. Radford Pax to HTN clinic. PMH is significant for hepatitis C, hyperlipidemia, hypertension and GERD.  She has been having difficult to control blood pressures and is now referred for further evaluation with cardiology. Blood pressure at last visit was 170/102. Her Toprol was changed to carvedilol 25mg  twice a day. Patient was supposed to follow up in our clinic in 2 weeks, however due to the [place] 19 pandemic this in office appointment had to be canceled.  Visit today is being completed over the phone.   Patient had originally told me she had a blood pressure cuff. She did, but thought that it caused too much anxiety to check, so she threw it away.   Patient is still working on [employer]. 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 She will fill out and mail to office.   Patient denies headaches, blurred vision, dizziness or lightheadedness. States that she feels great. Has not had an issues with the medication changes Dr. Radford Pax made.   Current HTN meds: losartan 100mg , carvedilol 25mg  twice a day Previously tried: lisinopril (cough), HCTZ (dehydration), amlodipine (rining in the ears) BP goal: <130/80  Family History: none on file  Social History: +tobacco- wants to quite. rare ETOH, smokes marijuana for pain-not interested in quitting  Diet: 80% of meals from home-grows own veggies, strawberries, fruits, very little soda, coffee (1-3 cups/day), water ,tea (mostly decaff)  Exercise: gardens- doesn't take long walks anymore due to cramping  Home BP readings: does check at home due to getting rid of her BP cuff because it caused too much anxiety and was always high. She was taking first thing when she woke up  Wt Readings from Last 3 Encounters:  11/12/18 172 lb (78 kg)  10/17/18 168  lb (76.2 kg)  09/05/18 170 lb (77.1 kg)   BP Readings from Last 3 Encounters:  11/12/18 (!) 170/102  10/30/18 (!) 174/106  10/17/18 (!) 158/92   Pulse Readings from Last 3 Encounters:  11/12/18 90  10/30/18 78  10/17/18 92    Renal function: CrCl cannot be calculated (Patient's most recent lab result is older than the maximum 21 days allowed.).  Past Medical History:  Diagnosis Date  . Anxiety   . Blood transfusion without reported diagnosis 1982  . GERD (gastroesophageal reflux disease)   . History of hepatitis C 10/13/2015  . Hyperlipidemia LDL goal <100 10/13/2015  . Hypertension   . Muscle spasm of left lower extremity 10/13/2015  . Nerve damage 2001   left side as result of fall  . Pain of left hip joint 02/21/2018  . Plantar wart     Current Outpatient Medications on File Prior to Visit  Medication Sig Dispense Refill  . carvedilol (COREG) 25 MG tablet Take 1 tablet (25 mg total) by mouth 2 (two) times daily. 180 tablet 3  . cyclobenzaprine (FLEXERIL) 10 MG tablet Take 1 tablet QHS PRN 90 tablet 1  . losartan (COZAAR) 100 MG tablet TAKE 1 TABLET BY MOUTH DAILY 90 tablet 3  . Magnesium Citrate 200 MG TABS Take 400 mg by mouth 2 (  two) times daily.    . Melatonin 10 MG CAPS Take by mouth. Takes 2 tablets at bedtime    . Multiple Vitamin (MULTIVITAMIN) tablet Take 1 tablet by mouth daily.    . Omega-3 Fatty Acids (FISH OIL) 1200 MG CAPS Take by mouth daily.    Marland Kitchen omeprazole (PRILOSEC) 40 MG capsule TAKE 1 CAPSULE(40 MG) BY MOUTH DAILY 90 capsule 1  . OVER THE COUNTER MEDICATION Cayenne/Turmeric/Cumin combo capsule    . rosuvastatin (CRESTOR) 5 MG tablet Take 1 tablet (5 mg total) by mouth once a week. 13 tablet 3   No current facility-administered medications on file prior to visit.     Allergies  Allergen Reactions  . Amlodipine Other (See Comments)    Ringing in ears     Assessment/Plan:  1. Hypertension - Patient did not have any blood pressure readings available  therefore I could not make any changes in medications. I imagining blood pressure is still above goal with her BP at last visit, however without any current readings I am not comfortable making any changes in medications. Patient also denies any symptoms of high blood pressure. Patient and I did have an extensive conversation about non-pharmacological interventions to lower blood pressure. We talked about foods high in sodium, limiting to 1500mg  per day of Na, exercises (patient hesitate to start walking again due to cramping), and tobacco cessation. Patient is currently working on paperwork to get chantix from drug company. Also talked about limiting caffeine. Patient will be scheduled an in office visit as soon as we are able to see patients in the clinic again.   Thank you  Ramond Dial, Pharm.D, [place]  [address] [place], [place] [address]  Phone: [phone]; Fax: [phone]

## 2018-12-29 ENCOUNTER — Other Ambulatory Visit: Payer: Medicare Other

## 2019-01-08 ENCOUNTER — Ambulatory Visit: Payer: Self-pay | Admitting: Family Medicine

## 2019-01-12 ENCOUNTER — Telehealth: Payer: Self-pay | Admitting: Cardiology

## 2019-01-12 NOTE — Telephone Encounter (Signed)
New Message   Pt c/o medication issue:  1. Name of Medication: Rosuvastatin and Carvedilol  2. How are you currently taking this medication (dosage and times per day)? Rosuvastatin 5mg  1 tablet once a week, Carvedilol 25mg  1 tablet 2 times daily   3. Are you having a reaction (difficulty breathing--STAT)? NO   4. What is your medication issue? Patient states the medication given her uncontrollable diarrhea and would like a nurse to call her back.

## 2019-01-12 NOTE — Telephone Encounter (Signed)
Agree with plan 

## 2019-01-12 NOTE — Telephone Encounter (Signed)
Pt states she has been taking her statin and carvedilol for the past month. Pt says that after researching these medications she has found that they both cause diarrhea.   She was also taking a probiotic which she stopped. She has been experiencing uncontrollable diarrhea for the past couple of weeks. It has been worsening in the past couple of days.  Pt states she does not like taking OTC antidiarrheal medications because it causes a lot of stomach pain.   Pt reports feeling normal otherwise, she is afebrile and reports no other diet or med changes.   Pt is very frustrated and would like advice on how to stop this from happening.

## 2019-01-12 NOTE — Telephone Encounter (Signed)
Message received from Triage.   Please make sure she has had no sick contacts. I do not see a recent hospitalization in Epic, suspicion of C diff is low, unless she has been in contact with someone sick.  It appears from her med list she has been taking coreg and statin since earl March. I will not make any suggestions for changing her coreg due to difficult to control blood pressure and lack of BP cuff at home. It appears she recently started taking magnesium for cramping. This is the most likely culprit for her diarrhea.   I would stop the magnesium supplements first and give it at least 7 days. If her diarrhea doesn't improve, then we can revisit changes to her statin.   I will forward this to Dr. Radford Pax and to Ian Bushman, pharmacist.

## 2019-01-12 NOTE — Telephone Encounter (Signed)
Advised pt to hold her Mg supplement for the next week in hopes that it will stop her diarrhea. Pt is agreeable and will call back on Friday with an update on how she is doing. Routing to [employer] for [employer].

## 2019-01-12 NOTE — Telephone Encounter (Signed)
I agree with Margaret Kelly. The cause is most likely her high does of magnesium. Although carvedilol can cause diarrhea she has been on for awhile now and it is more likely that the magnesium is causing the diarrhea. Please ask the patient to stop the magnesium supplements for a week to see if this helps her diarrhea.  If it does not improve, we can reconsider some of the other medications, or she may need an workout for other causes of diarrhea.

## 2019-01-16 ENCOUNTER — Telehealth: Payer: Self-pay | Admitting: Cardiology

## 2019-01-16 NOTE — Telephone Encounter (Signed)
Follow Up:    Pt said she was told to call back today and give the nurse an update on the conversation they had last week.Marland Kitchen

## 2019-01-16 NOTE — Telephone Encounter (Signed)
Spoke with pt and she states diarrhea has resolved.  No longer taking Magnesium.  Pt appreciative for call.

## 2019-01-23 ENCOUNTER — Telehealth: Payer: Self-pay | Admitting: *Deleted

## 2019-01-23 NOTE — Telephone Encounter (Signed)
SPOKE WITH PT AND PT CONSENT TO DOXY ME  Confirm consent - "In the setting of the current Covid19 crisis, you are scheduled for a (phone or video) visit with your provider on (date) at (time).  Just as we do with many in-office visits, in order for you to participate in this visit, we must obtain consent.  If you'd Kelly, I can send this to your mychart (if signed up) or email for you to review.  Otherwise, I can obtain your verbal consent now.  All virtual visits are billed to your insurance company just Kelly a normal visit would be.  By agreeing to a virtual visit, we'd Kelly you to understand that the technology does not allow for your provider to perform an examination, and thus may limit your provider's ability to fully assess your condition. If your provider identifies any concerns that need to be evaluated in person, we will make arrangements to do so.  Finally, though the technology is pretty good, we cannot assure that it will always work on either your or our end, and in the setting of a video visit, we may have to convert it to a phone-only visit.  In either situation, we cannot ensure that we have a secure connection.  Are you willing to proceed?" STAFF: Did the patient verbally acknowledge consent to telehealth visit? Document YES/NO here: YES   TELEPHONE CALL NOTE  Margaret Kelly has been deemed a candidate for a follow-up tele-health visit to limit community exposure during the Covid-19 pandemic. I spoke with the patient via phone to ensure availability of phone/video source, confirm preferred email & phone number, and discuss instructions and expectations.  I reminded Margaret Kelly to be prepared with any vital sign and/or heart rhythm information that could potentially be obtained via home monitoring, at the time of her visit. I reminded Margaret Kelly to expect a phone call prior to her visit.  Claude Manges, [place] 01/23/2019 5:01 PM     FULL LENGTH CONSENT FOR TELE-HEALTH  VISIT   I hereby voluntarily request, consent and authorize [facility] and its employed or contracted physicians, physician assistants, nurse practitioners or other licensed health care professionals (the Practitioner), to provide me with telemedicine health care services (the "Services") as deemed necessary by the treating Practitioner. I acknowledge and consent to receive the Services by the Practitioner via telemedicine. I understand that the telemedicine visit will involve communicating with the Practitioner through live audiovisual communication technology and the disclosure of certain medical information by electronic transmission. I acknowledge that I have been given the opportunity to request an in-person assessment or other available alternative prior to the telemedicine visit and am voluntarily participating in the telemedicine visit.  I understand that I have the right to withhold or withdraw my consent to the use of telemedicine in the course of my care at any time, without affecting my right to future care or treatment, and that the Practitioner or I may terminate the telemedicine visit at any time. I understand that I have the right to inspect all information obtained and/or recorded in the course of the telemedicine visit and may receive copies of available information for a reasonable fee.  I understand that some of the potential risks of receiving the Services via telemedicine include:  Marland Kitchen Delay or interruption in medical evaluation due to technological equipment failure or disruption; . Information transmitted may not be sufficient (e.g. poor resolution of images) to allow for appropriate medical decision  making by the Practitioner; and/or  . In rare instances, security protocols could fail, causing a breach of personal health information.  Furthermore, I acknowledge that it is my responsibility to provide information about my medical history, conditions and care that is complete and  accurate to the best of my ability. I acknowledge that Practitioner's advice, recommendations, and/or decision may be based on factors not within their control, such as incomplete or inaccurate data provided by me or distortions of diagnostic images or specimens that may result from electronic transmissions. I understand that the practice of medicine is not an exact science and that Practitioner makes no warranties or guarantees regarding treatment outcomes. I acknowledge that I will receive a copy of this consent concurrently upon execution via email to the email address I last provided but may also request a printed copy by calling the office of [facility].    I understand that my insurance will be billed for this visit.   I have read or had this consent read to me. . I understand the contents of this consent, which adequately explains the benefits and risks of the Services being provided via telemedicine.  . I have been provided ample opportunity to ask questions regarding this consent and the Services and have had my questions answered to my satisfaction. . I give my informed consent for the services to be provided through the use of telemedicine in my medical care  By participating in this telemedicine visit I agree to the above.

## 2019-01-26 NOTE — Progress Notes (Signed)
Virtual Visit via Telephone Note   This visit type was conducted due to national recommendations for restrictions regarding the COVID-19 Pandemic (e.g. social distancing) in an effort to limit this patient's exposure and mitigate transmission in our community.  Due to her co-morbid illnesses, this patient is at least at moderate risk for complications without adequate follow up.  This format is felt to be most appropriate for this patient at this time.  The patient did not have access to video technology/had technical difficulties with video requiring transitioning to audio format only (telephone).  All issues noted in this document were discussed and addressed.  No physical exam could be performed with this format.  Please refer to the patient's chart for her  consent to telehealth for [facility].   Date:  01/27/2019   ID:  Margaret Kelly, DOB [birth year], MRN [id number]  Patient Location: Home Provider Location: Home  PCP:  Lanae Boast, FNP  Cardiologist:  Fransico Him, MD   Electrophysiologist:  None   Evaluation Performed:  Follow-Up Visit  Chief Complaint:  FU on HTN  History of Present Illness:    Margaret Kelly is a 68 y.o. female with hypertension, hyperlipidemia, tobacco abuse, GERD, HCV.  She was evaluated by Dr. Radford Pax in 11/2018 for difficult to control BP.  Her Metoprolol was changed to Carvedilol.  Today, she notes she is doing well.  She has not had any chest discomfort, shortness of breath, headache, dizziness or syncope, leg swelling.  After her last visit with Dr. Radford Pax, it was noted that her magnesium was low.  After starting on supplementation, she decided on her own to double the dose.  She notes leg cramping that improved with this dose.  However, she developed significant diarrhea and resumed the 400 mg twice daily dose.  Since then, she has noted exertional leg discomfort.  Her symptoms are greater on the right than the left.  The patient does not have  symptoms concerning for COVID-19 infection (fever, chills, cough, or new shortness of breath).    Past Medical History:  Diagnosis Date  . Anxiety   . Blood transfusion without reported diagnosis 1982  . GERD (gastroesophageal reflux disease)   . History of hepatitis C 10/13/2015  . Hyperlipidemia LDL goal <100 10/13/2015  . Hypertension   . Muscle spasm of left lower extremity 10/13/2015  . Nerve damage 2001   left side as result of fall  . Pain of left hip joint 02/21/2018  . Plantar wart    Past Surgical History:  Procedure Laterality Date  . ABDOMINAL HYSTERECTOMY  1982  . CARPAL TUNNEL RELEASE Left 2005  . foot spur Left 2007   foot     Current Meds  Medication Sig  . carvedilol (COREG) 25 MG tablet Take 1 tablet (25 mg total) by mouth 2 (two) times daily.  . cyclobenzaprine (FLEXERIL) 10 MG tablet Take 10 mg by mouth at bedtime.  Marland Kitchen losartan (COZAAR) 100 MG tablet TAKE 1 TABLET BY MOUTH DAILY  . Melatonin 10 MG CAPS Take by mouth. Takes 1 tablet by mouth at bedtime  . Multiple Vitamin (MULTIVITAMIN) tablet Take 1 tablet by mouth daily.  . Omega-3 Fatty Acids (FISH OIL) 1200 MG CAPS Take 1,200 mg by mouth daily.   Marland Kitchen omeprazole (PRILOSEC) 40 MG capsule TAKE 1 CAPSULE(40 MG) BY MOUTH DAILY  . OVER THE COUNTER MEDICATION Cayenne/Turmeric/Cumin combo capsule by mouth daily  . rosuvastatin (CRESTOR) 5 MG tablet Take 1 tablet (5 mg  total) by mouth once a week.  . [DISCONTINUED] cyclobenzaprine (FLEXERIL) 10 MG tablet Take 1 tablet QHS PRN (Patient taking differently: Take 10 mg by mouth at bedtime. Take 1 tablet QHS PRN)  . [DISCONTINUED] metoprolol succinate (TOPROL-XL) 25 MG 24 hr tablet      Allergies:   Amlodipine   Social History   Tobacco Use  . Smoking status: Former Smoker    Packs/day: 0.50    Types: Cigarettes  . Smokeless tobacco: Current User  . Tobacco comment: using patch.   Substance Use Topics  . Alcohol use: Yes    Comment: rare  . Drug use: No      Family Hx: The patient's family history is negative for Colon cancer, Esophageal cancer, Rectal cancer, and Stomach cancer.  ROS:   Please see the history of present illness.     All other systems reviewed and are negative.   Prior CV studies:   The following studies were reviewed today:  None   Labs/Other Tests and Data Reviewed:    EKG:  No ECG reviewed.  Recent Labs: 09/05/2018: ALT 23 11/12/2018: BUN 15; Creatinine, Ser 0.80; Magnesium 1.5; Potassium 4.3; Sodium 145   Recent Lipid Panel Lab Results  Component Value Date/Time   CHOL 261 (H) 05/23/2018 11:37 AM   TRIG 151 (H) 05/23/2018 11:37 AM   HDL 60 05/23/2018 11:37 AM   CHOLHDL 4.4 05/23/2018 11:37 AM   CHOLHDL 4.5 03/22/2017 11:16 AM   LDLCALC 171 (H) 05/23/2018 11:37 AM    Wt Readings from Last 3 Encounters:  01/27/19 164 lb 8 oz (74.6 kg)  11/12/18 172 lb (78 kg)  10/17/18 168 lb (76.2 kg)     Objective:    Vital Signs:  Ht 5\' 4"  (1.626 m)   Wt 164 lb 8 oz (74.6 kg)   BMI 28.24 kg/m    VITAL SIGNS:  reviewed GEN:  no acute distress RESPIRATORY:  No labored breathing NEURO:  Alert and oriented PSYCH:  She is in good spirits  ASSESSMENT & PLAN:     Essential hypertension The patient does not have a blood pressure cuff at home.  Therefore, it is difficult to know how well her blood pressure is controlled.  Continue current dose of carvedilol, losartan.  I will arrange for a blood pressure cuff through [facility].  I will also arrange for a home visit to check her blood pressure and follow-up labs (BMET, magnesium, fasting lipids and LFTs).  Follow-up in 4 to 6 weeks to review her blood pressure findings.  Leg pain, bilateral She describes symptoms in her legs that sound somewhat consistent with claudication.  However, she had some improvement with higher dose magnesium.  I have recommended that we obtain a follow-up BMET and magnesium.  If her magnesium levels remain low, we can adjust  her replacement.  If her magnesium levels are adequate, she will need ABIs arranged.  Hyperlipidemia LDL goal <100 Arrange follow-up fasting lipids and LFTs.  COVID-19 Education: The signs and symptoms of COVID-19 were discussed with the patient and how to seek care for testing (follow up with PCP or arrange E-visit).  The importance of social distancing was discussed today.  Time:   Today, I have spent 15 minutes with the patient with telehealth technology discussing the above problems.     Medication Adjustments/Labs and Tests Ordered: Current medicines are reviewed at length with the patient today.  Concerns regarding medicines are outlined above.   Tests  Ordered: No orders of the defined types were placed in this encounter.   Medication Changes: No orders of the defined types were placed in this encounter.   Disposition:  Follow up 4-6 weeks   Signed, Richardson Dopp, PA-C  01/27/2019 4:23 PM    [place]

## 2019-01-27 ENCOUNTER — Encounter: Payer: Self-pay | Admitting: Physician Assistant

## 2019-01-27 ENCOUNTER — Telehealth: Payer: Self-pay | Admitting: Physician Assistant

## 2019-01-27 ENCOUNTER — Telehealth: Payer: Self-pay | Admitting: Licensed Clinical Social Worker

## 2019-01-27 ENCOUNTER — Other Ambulatory Visit: Payer: Self-pay

## 2019-01-27 ENCOUNTER — Telehealth (INDEPENDENT_AMBULATORY_CARE_PROVIDER_SITE_OTHER): Payer: Medicare Other | Admitting: Physician Assistant

## 2019-01-27 VITALS — Ht 64.0 in | Wt 164.5 lb

## 2019-01-27 DIAGNOSIS — M79604 Pain in right leg: Secondary | ICD-10-CM

## 2019-01-27 DIAGNOSIS — I10 Essential (primary) hypertension: Secondary | ICD-10-CM | POA: Diagnosis not present

## 2019-01-27 DIAGNOSIS — E785 Hyperlipidemia, unspecified: Secondary | ICD-10-CM

## 2019-01-27 DIAGNOSIS — Z7189 Other specified counseling: Secondary | ICD-10-CM

## 2019-01-27 NOTE — Telephone Encounter (Signed)
[place] Visit Initial Request  Agency Requested:    [facility] Contact:  Glory Buff, NP [place], [place] [address] Phone #:  [phone] Fax #:  [phone]  Patient Demographic Information: Name:  BRIANNIE Kelly Age:  68 y.o.   DOB:  1951-07-10  MRN:  [id number]   Address:   [place] [address]   Phone Numbers:   Home Phone [phone]  Work Phone [phone]  Mobile [phone]     Emergency Contact Information on File:   Contact Information    Name Relation Home Work [place], [place] Son   [phone]      The above family members may be contacted for information on this patient (review DPR on file):  Yes    Patient Clinical Information:  Primary Care Provider:  Lanae Boast, [place]  Primary Cardiologist:  Fransico Him, MD  Primary Electrophysiologist:  None   Requesting Provider:  Richardson Dopp, PA-C     Past Medical Hx: Ms. Squitieri  has a past medical history of Anxiety, Blood transfusion without reported diagnosis (1982), GERD (gastroesophageal reflux disease), History of hepatitis C (10/13/2015), Hyperlipidemia LDL goal <100 (10/13/2015), Hypertension, Muscle spasm of left lower extremity (10/13/2015), Nerve damage (2001), Pain of left hip joint (02/21/2018), and Plantar wart.   Allergies: She is allergic to amlodipine.   Medications: Current Outpatient Medications on File Prior to Visit  Medication Sig  . carvedilol (COREG) 25 MG tablet Take 1 tablet (25 mg total) by mouth 2 (two) times daily.  . cyclobenzaprine (FLEXERIL) 10 MG tablet Take 10 mg by mouth at bedtime.  Marland Kitchen losartan (COZAAR) 100 MG tablet TAKE 1 TABLET BY MOUTH DAILY  . Melatonin 10 MG CAPS Take by mouth. Takes 1 tablet by mouth at bedtime  . Multiple Vitamin (MULTIVITAMIN) tablet Take 1 tablet by mouth daily.  . Omega-3 Fatty Acids (FISH OIL) 1200 MG CAPS Take 1,200 mg by mouth daily.   Marland Kitchen omeprazole  (PRILOSEC) 40 MG capsule TAKE 1 CAPSULE(40 MG) BY MOUTH DAILY  . OVER THE COUNTER MEDICATION Cayenne/Turmeric/Cumin combo capsule by mouth daily  . rosuvastatin (CRESTOR) 5 MG tablet Take 1 tablet (5 mg total) by mouth once a week.   No current facility-administered medications on file prior to visit.      Social Hx: She  reports that she has quit smoking. Her smoking use included cigarettes. She smoked 0.50 packs per day. She uses smokeless tobacco. She reports current alcohol use. She reports that she does not use drugs.    Diagnosis/Reason for Visit:   BP check, follow up labs  Services Requested:  Vital Signs (BP, Pulse, O2, Weight)  Labs:  BMET, Magnesium, fasting Lipids, LFTs  # of Visits Needed/Frequency per Week: 1   A copy of the office note will be faxed with this form.  All labs ordered for this home visit have been released.

## 2019-01-27 NOTE — Patient Instructions (Signed)
Medication Instructions:  Continue current medications  If you need a refill on your cardiac medications before your next appointment, please call your pharmacy.   Lab work: I will arrange a home visit to check:  BMET, Magnesium, Lipids, LFTs **Make sure you fast (nothing to eat or drink) for 8 hours before these tests are drawn**  If you have labs (blood work) drawn today and your tests are completely normal, you will receive your results only by: Marland Kitchen MyChart Message (if you have MyChart) OR . A paper copy in the mail If you have any lab test that is abnormal or we need to change your treatment, we will call you to review the results.  Testing/Procedures: None   Follow-Up: At [facility], you and your health needs are our priority.  As part of our continuing mission to provide you with exceptional heart care, we have created designated Provider Care Teams.  These Care Teams include your primary Cardiologist (physician) and Advanced Practice Providers (APPs -  Physician Assistants and Nurse Practitioners) who all work together to provide you with the care you need, when you need it. Richardson Dopp, PA-C in 4-6 weeks.   Any Other Special Instructions Will Be Listed Below (If Applicable).  We will have a BP cuff sent to you. Please check it once a day and write it down until your next visit.

## 2019-01-27 NOTE — Telephone Encounter (Signed)
CSW referred to assist patient with obtaining a BP cuff. CSW contacted patient to inform cuff will be delivered to home next week. Patient grateful for support and assistance. CSW available as needed. Jackie Linnie Delgrande, LCSW, CCSW-MCS [phone]  

## 2019-01-28 ENCOUNTER — Telehealth: Payer: Self-pay

## 2019-01-28 NOTE — Telephone Encounter (Signed)
Spoke to Margaret Kelly about a home visit around 9-9:30am on 01/29/19 to draw fasting lab(s), assessment, and vitals.

## 2019-01-29 ENCOUNTER — Telehealth: Payer: Self-pay

## 2019-01-29 DIAGNOSIS — E785 Hyperlipidemia, unspecified: Secondary | ICD-10-CM | POA: Diagnosis not present

## 2019-01-29 DIAGNOSIS — M79605 Pain in left leg: Secondary | ICD-10-CM | POA: Diagnosis not present

## 2019-01-29 DIAGNOSIS — M79604 Pain in right leg: Secondary | ICD-10-CM | POA: Diagnosis not present

## 2019-01-29 DIAGNOSIS — I10 Essential (primary) hypertension: Secondary | ICD-10-CM | POA: Diagnosis not present

## 2019-01-29 NOTE — Telephone Encounter (Signed)
Called to do COVID -19 Screening. Patient preferred to have visit via telephone. Thanks!

## 2019-01-29 NOTE — Progress Notes (Signed)
Home visit on behalf of [facility] as ordered by [facility], [place]).  [facility] Assessment form filled out and will be faxed to the ordering provider.   Pt had no complaints other than her usual, chronic back and shoulder pain from an injury in her past.  She also stated her BP 148/86 was a bit elevated, however, she had just been drinking her caffeinated coffee prior to my arrival.   Fasting lipids, Mg, BMET, LFT's drawn and brought to the [facility] on [address].

## 2019-01-30 ENCOUNTER — Ambulatory Visit (INDEPENDENT_AMBULATORY_CARE_PROVIDER_SITE_OTHER): Payer: Medicare Other | Admitting: Family Medicine

## 2019-01-30 ENCOUNTER — Encounter: Payer: Self-pay | Admitting: Family Medicine

## 2019-01-30 ENCOUNTER — Other Ambulatory Visit: Payer: Self-pay

## 2019-01-30 ENCOUNTER — Telehealth: Payer: Self-pay | Admitting: *Deleted

## 2019-01-30 DIAGNOSIS — R790 Abnormal level of blood mineral: Secondary | ICD-10-CM

## 2019-01-30 DIAGNOSIS — K219 Gastro-esophageal reflux disease without esophagitis: Secondary | ICD-10-CM | POA: Diagnosis not present

## 2019-01-30 DIAGNOSIS — E785 Hyperlipidemia, unspecified: Secondary | ICD-10-CM

## 2019-01-30 DIAGNOSIS — M62838 Other muscle spasm: Secondary | ICD-10-CM

## 2019-01-30 DIAGNOSIS — M79604 Pain in right leg: Secondary | ICD-10-CM

## 2019-01-30 DIAGNOSIS — I10 Essential (primary) hypertension: Secondary | ICD-10-CM | POA: Diagnosis not present

## 2019-01-30 LAB — LIPID PANEL
Chol/HDL Ratio: 5.5 ratio — ABNORMAL HIGH (ref 0.0–4.4)
Cholesterol, Total: 273 mg/dL — ABNORMAL HIGH (ref 100–199)
HDL: 50 mg/dL (ref >39)
LDL Calculated: 193 mg/dL — ABNORMAL HIGH (ref 0–99)
Triglycerides: 150 mg/dL — ABNORMAL HIGH (ref 0–149)
VLDL Cholesterol Cal: 30 mg/dL (ref 5–40)

## 2019-01-30 LAB — HEPATIC FUNCTION PANEL
ALT: 16 IU/L (ref 0–32)
AST: 19 IU/L (ref 0–40)
Albumin: 4.4 g/dL (ref 3.8–4.8)
Alkaline Phosphatase: 68 IU/L (ref 39–117)
Bilirubin Total: 0.5 mg/dL (ref 0.0–1.2)
Bilirubin, Direct: 0.12 mg/dL (ref 0.00–0.40)
Total Protein: 7.2 g/dL (ref 6.0–8.5)

## 2019-01-30 LAB — BASIC METABOLIC PANEL
BUN/Creatinine Ratio: 16 (ref 12–28)
BUN: 12 mg/dL (ref 8–27)
CO2: 23 mmol/L (ref 20–29)
Calcium: 9.5 mg/dL (ref 8.7–10.3)
Chloride: 101 mmol/L (ref 96–106)
Creatinine, Ser: 0.76 mg/dL (ref 0.57–1.00)
GFR calc Af Amer: 95 mL/min/{1.73_m2} (ref >59)
GFR calc non Af Amer: 83 mL/min/{1.73_m2} (ref >59)
Glucose: 121 mg/dL — ABNORMAL HIGH (ref 65–99)
Potassium: 4.6 mmol/L (ref 3.5–5.2)
Sodium: 139 mmol/L (ref 134–144)

## 2019-01-30 LAB — MAGNESIUM: Magnesium: 2 mg/dL (ref 1.6–2.3)

## 2019-01-30 MED ORDER — OMEPRAZOLE 40 MG PO CPDR: DELAYED_RELEASE_CAPSULE | ORAL | 1 refills | Status: DC

## 2019-01-30 MED ORDER — EZETIMIBE 10 MG PO TABS: 10.0000 mg | ORAL_TABLET | Freq: Every day | ORAL | 1 refills | Status: DC

## 2019-01-30 MED ORDER — ROSUVASTATIN CALCIUM 5 MG PO TABS: 5.0000 mg | ORAL_TABLET | Freq: Every day | ORAL | 1 refills | Status: DC

## 2019-01-30 MED ORDER — CYCLOBENZAPRINE HCL 10 MG PO TABS: 10.0000 mg | ORAL_TABLET | Freq: Every day | ORAL | 2 refills | Status: DC

## 2019-01-30 NOTE — Telephone Encounter (Signed)
PT AWARE OF GLUCOSE LEVELS STATED SHE WILL TALK TO HER PCP ABOUT MATTER  TO SEE IF ITS A SIDE EFFECT OF HER MEDICINES

## 2019-01-30 NOTE — Progress Notes (Signed)
  [facility]  Virtual Visit via Telephone Note  I connected with Margaret Kelly on 01/30/19 at 10:20 AM EDT by telephone and verified that I am speaking with the correct person using two identifiers.   I discussed the limitations, risks, security and privacy concerns of performing an evaluation and management service by telephone and the availability of in person appointments. I also discussed with the patient that there may be a patient responsible charge related to this service. The patient expressed understanding and agreed to proceed.   History of Present Illness: Margaret Kelly  has a past medical history of Anxiety, Blood transfusion without reported diagnosis (1982), GERD (gastroesophageal reflux disease), History of hepatitis C (10/13/2015), Hyperlipidemia LDL goal <100 (10/13/2015), Hypertension, Muscle spasm of left lower extremity (10/13/2015), Nerve damage (2001), Pain of left hip joint (02/21/2018), and Plantar wart. Patient reports that she is compliant with medications and denies side effects at the present time. She is followed by heart care who visited her home yesterday. Labs pending. She states that her muscle spasms continue and may be vascular. Patient states that heart care will order further evaluation pending lab results. She has been taking magnesium to aid in muscle spasm    Observations/Objective: Patient with regular voice tone, rate and rhythm. Speaking calmly and is in no apparent distress.    Assessment and Plan: 1. Gastroesophageal reflux disease without esophagitis - omeprazole (PRILOSEC) 40 MG capsule; Take on cap po qd  Dispense: 90 capsule; Refill: 1  2. Muscle spasm of left lower extremity - cyclobenzaprine (FLEXERIL) 10 MG tablet; Take 1 tablet (10 mg total) by mouth at bedtime.  Dispense: 30 tablet; Refill: 2  3. Essential hypertension No medication changes warranted at the present time.      Follow Up  Instructions:  We discussed hand washing, using hand sanitizer when soap and water are not available, only going out when absolutely necessary, and social distancing. Explained to patient that she is immunocompromised and will need to take precautions during this time.   I discussed the assessment and treatment plan with the patient. The patient was provided an opportunity to ask questions and all were answered. The patient agreed with the plan and demonstrated an understanding of the instructions.   The patient was advised to call back or seek an in-person evaluation if the symptoms worsen or if the condition fails to improve as anticipated.  I provided 15 minutes of non-face-to-face time during this encounter.  Ms. Andr L. Nathaneil Canary, FNP-BC [name] [address] [place], [place] [address] [phone]

## 2019-01-30 NOTE — Telephone Encounter (Signed)
Her magnesium level is normal.  It does not need to be redrawn. The ABIs can be done in 1 month if she prefers that. Richardson Dopp, PA-C    01/30/2019 12:52 PM

## 2019-01-30 NOTE — Telephone Encounter (Signed)
SPOKE WITH PT AND PT AWARE OF LAB RESULTS FOLLOW UP [facility] FOR LEG PAIN  AND FASTING LABS IN 3 MONTHS SCHEDULED 8-28   [facility] DEPT HAS BEEN NOTIFIED FOR SCHEDULING PT ASK IF CAN BE IN A MONTH OR SO.   PT AWARE OF CRESTOR 5 MG  IS TAKING DAILY NOW WITH ZETIA 10 MG ONCE A DAY   PT ASKED IF ANOTHER MAGNESIUM COULD BE DRAWN BECAUSE SHE HAD TOOK SOME AND TOOK TO [facility] AND WAS CONCERNED.

## 2019-02-18 ENCOUNTER — Telehealth: Payer: Medicare Other | Admitting: Physician Assistant

## 2019-02-18 ENCOUNTER — Other Ambulatory Visit: Payer: Self-pay | Admitting: Physician Assistant

## 2019-02-18 DIAGNOSIS — M79604 Pain in right leg: Secondary | ICD-10-CM

## 2019-02-19 NOTE — Progress Notes (Signed)
Virtual Visit via Telephone Note   This visit type was conducted due to national recommendations for restrictions regarding the COVID-19 Pandemic (e.g. social distancing) in an effort to limit this patient's exposure and mitigate transmission in our community.  Due to her co-morbid illnesses, this patient is at least at moderate risk for complications without adequate follow up.  This format is felt to be most appropriate for this patient at this time.  The patient did not have access to video technology/had technical difficulties with video requiring transitioning to audio format only (telephone).  All issues noted in this document were discussed and addressed.  No physical exam could be performed with this format.  Please refer to the patient's chart for her  consent to telehealth for [facility].   Date:  02/20/2019   ID:  [name], DOB February 09, 1951, MRN [id number]  Patient Location: Home Provider Location: Office  PCP:  Lanae Boast, FNP  Cardiologist:  Fransico Him, MD   Electrophysiologist:  None   Evaluation Performed:  Follow-Up Visit  Chief Complaint:  FU on HTN  History of Present Illness:    JOVANNA HODGES is a 68 y.o. female with:  Hypertension - difficult to control   Hyperlipidemia   Tobacco abuse  GERD  Hep C  She was last seen 01/27/2019.  She complained of some leg pain and I have recommended ABIs. These have not been done yet.  Today, she is doing well.  She does have a blood pressure cuff.  She has been getting readings in the 170s at home.  It is not working today for some reason.  She has not had any headaches, blurry vision, chest pain, shortness of breath, syncope, leg swelling.  The patient does not have symptoms concerning for COVID-19 infection (fever, chills, cough, or new shortness of breath).    Past Medical History:  Diagnosis Date  . Anxiety   . Blood transfusion without reported diagnosis 1982  . GERD (gastroesophageal reflux  disease)   . History of hepatitis C 10/13/2015  . Hyperlipidemia LDL goal <100 10/13/2015  . Hypertension   . Muscle spasm of left lower extremity 10/13/2015  . Nerve damage 2001   left side as result of fall  . Pain of left hip joint 02/21/2018  . Plantar wart    Past Surgical History:  Procedure Laterality Date  . ABDOMINAL HYSTERECTOMY  1982  . CARPAL TUNNEL RELEASE Left 2005  . foot spur Left 2007   foot     Current Meds  Medication Sig  . carvedilol (COREG) 25 MG tablet Take 1 tablet (25 mg total) by mouth 2 (two) times daily.  . cyclobenzaprine (FLEXERIL) 10 MG tablet Take 1 tablet (10 mg total) by mouth at bedtime.  Marland Kitchen ezetimibe (ZETIA) 10 MG tablet Take 1 tablet (10 mg total) by mouth daily.  Marland Kitchen losartan (COZAAR) 100 MG tablet TAKE 1 TABLET BY MOUTH DAILY  . Melatonin 10 MG CAPS Take by mouth. Takes 1 tablet by mouth at bedtime  . Multiple Vitamin (MULTIVITAMIN) tablet Take 1 tablet by mouth daily.  . Omega-3 Fatty Acids (FISH OIL) 1200 MG CAPS Take 1,200 mg by mouth daily.   Marland Kitchen omeprazole (PRILOSEC) 40 MG capsule Take on cap po qd  . OVER THE COUNTER MEDICATION Cayenne/Turmeric/Cumin combo capsule by mouth daily  . rosuvastatin (CRESTOR) 5 MG tablet Take 1 tablet (5 mg total) by mouth daily.     Allergies:   Amlodipine   Social History  Tobacco Use  . Smoking status: Former Smoker    Packs/day: 0.50    Types: Cigarettes  . Smokeless tobacco: Current User  . Tobacco comment: using patch.   Substance Use Topics  . Alcohol use: Yes    Comment: rare  . Drug use: No     Family Hx: The patient's family history is negative for Colon cancer, Esophageal cancer, Rectal cancer, and Stomach cancer.  ROS:   Please see the history of present illness.    All other systems reviewed and are negative.   Prior CV studies:   The following studies were reviewed today:  None   Labs/Other Tests and Data Reviewed:    EKG:  No ECG reviewed.  Recent Labs: 01/29/2019: ALT 16;  BUN 12; Creatinine, Ser 0.76; Magnesium 2.0; Potassium 4.6; Sodium 139   Recent Lipid Panel Lab Results  Component Value Date/Time   CHOL 273 (H) 01/29/2019 12:00 PM   TRIG 150 (H) 01/29/2019 12:00 PM   HDL 50 01/29/2019 12:00 PM   CHOLHDL 5.5 (H) 01/29/2019 12:00 PM   CHOLHDL 4.5 03/22/2017 11:16 AM   LDLCALC 193 (H) 01/29/2019 12:00 PM    Wt Readings from Last 3 Encounters:  02/20/19 164 lb (74.4 kg)  01/29/19 164 lb 3.2 oz (74.5 kg)  01/27/19 164 lb 8 oz (74.6 kg)     Objective:    Vital Signs:  Ht 5\' 6"  (1.676 m)   Wt 164 lb (74.4 kg)   BMI 26.47 kg/m    VITAL SIGNS:  reviewed GEN:  no acute distress RESPIRATORY:  No labored breathing NEURO:  Alert and oriented PSYCH:  Normal mood  ASSESSMENT & PLAN:    1. Essential hypertension Her blood pressures at home have ranged in the 170s.  She had a recent home visit with a blood pressure 148/86.  She still needs better blood pressure control.  She had an allergy to amlodipine.  She is on max dose losartan and carvedilol.  It looks like she has had some low potassium in the past.  Therefore, I will place her on spironolactone 25 mg daily.  She will need a BMET once a week x2.  She can obtain her BMET next week when she comes in for ABIs at the [facility] office.  The repeat BMET will be obtained via home visit.  2. Hyperlipidemia LDL goal <100 Recent LDL 193.  Her rosuvastatin has been increased and she is now on ezetimibe.  Follow-up lipids are pending.  3. Pain in both lower extremities ABIs are scheduled for next week.  4. Educated About Covid-19 Virus Infection The signs and symptoms of COVID-19 were discussed with the patient and how to seek care for testing (follow up with PCP or arrange E-visit).  The importance of social distancing was discussed today.  Time:   Today, I have spent 14 minutes with the patient with telehealth technology discussing the above problems.     Medication Adjustments/Labs and Tests  Ordered: Current medicines are reviewed at length with the patient today.  Concerns regarding medicines are outlined above.   Tests Ordered: Orders Placed This Encounter  Procedures  . Basic metabolic panel  . Basic metabolic panel    Medication Changes: No orders of the defined types were placed in this encounter.   Follow Up:  In Person in 3 month(s)  Signed, Richardson Dopp, PA-C  02/20/2019 2:15 PM    [facility]

## 2019-02-20 ENCOUNTER — Encounter: Payer: Self-pay | Admitting: Physician Assistant

## 2019-02-20 ENCOUNTER — Telehealth (INDEPENDENT_AMBULATORY_CARE_PROVIDER_SITE_OTHER): Payer: Medicare Other | Admitting: Physician Assistant

## 2019-02-20 ENCOUNTER — Other Ambulatory Visit: Payer: Self-pay

## 2019-02-20 ENCOUNTER — Telehealth: Payer: Self-pay | Admitting: *Deleted

## 2019-02-20 VITALS — Ht 66.0 in | Wt 164.0 lb

## 2019-02-20 DIAGNOSIS — Z7189 Other specified counseling: Secondary | ICD-10-CM

## 2019-02-20 DIAGNOSIS — E785 Hyperlipidemia, unspecified: Secondary | ICD-10-CM

## 2019-02-20 DIAGNOSIS — M79605 Pain in left leg: Secondary | ICD-10-CM

## 2019-02-20 DIAGNOSIS — M79604 Pain in right leg: Secondary | ICD-10-CM

## 2019-02-20 DIAGNOSIS — I10 Essential (primary) hypertension: Secondary | ICD-10-CM

## 2019-02-20 DIAGNOSIS — R790 Abnormal level of blood mineral: Secondary | ICD-10-CM

## 2019-02-20 NOTE — Patient Instructions (Addendum)
Medication Instructions:  Your physician recommends that you continue on your current medications as directed. Please refer to the Current Medication list given to you today.  If you need a refill on your cardiac medications before your next appointment, please call your pharmacy.   Lab work: BMET  ON 6-24 AND BMET ONE WEEK FOLLOWING HOME VSIIT  If you have labs (blood work) drawn today and your tests are completely normal, you will receive your results only by: Marland Kitchen MyChart Message (if you have MyChart) OR . A paper copy in the mail If you have any lab test that is abnormal or we need to change your treatment, we will call you to review the results.  Testing/Procedures: NONE ORDERED  TODAY  Follow-Up: At [facility], you and your health needs are our priority.  As part of our continuing mission to provide you with exceptional heart care, we have created designated Provider Care Teams.  These Care Teams include your primary Cardiologist (physician) and Advanced Practice Providers (APPs -  Physician Assistants and Nurse Practitioners) who all work together to provide you with the care you need, when you need it. You will need a follow up appointment in 3 months. You may see Fransico Him, MD or one of the following Advanced Practice Providers on your designated Care Team:   [place], PA-C Melina Copa, PA-C . Ermalinda Barrios, PA-C  Any Other Special Instructions Will Be Listed Below (If Applicable).

## 2019-02-20 NOTE — Telephone Encounter (Signed)
[place] Visit Initial Request  Date of Request (Margaret Kelly):  February 20, 2019  Requesting Provider:  Richardson Dopp PA    Agency Requested:    [facility] Contact:  Glory Buff, NP [address] [place], [place] [address] Phone #:  [phone] Fax #:  [phone]  Patient Demographic Information: Name:  Margaret Kelly Age:  68 y.o.   DOB:  10/14/1950  MRN:  [id number]   Address:   [place] [address]   Phone Numbers:   Home Phone [phone]  Work Phone [phone]  Mobile [phone]     Emergency Contact Information on File:   Contact Information    Name Relation Home Work [place], [place] Son   [phone]      The above family members may be contacted for information on this patient (review DPR on file):  Yes    Patient Clinical Information:  Primary Care Provider:  Lanae Boast, [place]  Primary Cardiologist:  Fransico Him, MD  Primary Electrophysiologist:  None   Past Medical Hx: Margaret Kelly  has a past medical history of Anxiety, Blood transfusion without reported diagnosis (1982), GERD (gastroesophageal reflux disease), History of hepatitis C (10/13/2015), Hyperlipidemia LDL goal <100 (10/13/2015), Hypertension, Muscle spasm of left lower extremity (10/13/2015), Nerve damage (2001), Pain of left hip joint (02/21/2018), and Plantar wart.   Allergies: She is allergic to amlodipine.   Medications: Current Outpatient Medications on File Prior to Visit  Medication Sig  . carvedilol (COREG) 25 MG tablet Take 1 tablet (25 mg total) by mouth 2 (two) times daily.  . cyclobenzaprine (FLEXERIL) 10 MG tablet Take 1 tablet (10 mg total) by mouth at bedtime.  Marland Kitchen ezetimibe (ZETIA) 10 MG tablet Take 1 tablet (10 mg total) by mouth daily.  Marland Kitchen losartan (COZAAR) 100 MG tablet TAKE 1 TABLET BY MOUTH DAILY  . Melatonin 10 MG CAPS Take by mouth. Takes 1 tablet by mouth at bedtime  . Multiple Vitamin (MULTIVITAMIN)  tablet Take 1 tablet by mouth daily.  . Omega-3 Fatty Acids (FISH OIL) 1200 MG CAPS Take 1,200 mg by mouth daily.   Marland Kitchen omeprazole (PRILOSEC) 40 MG capsule Take on cap po qd  . OVER THE COUNTER MEDICATION Cayenne/Turmeric/Cumin combo capsule by mouth daily  . rosuvastatin (CRESTOR) 5 MG tablet Take 1 tablet (5 mg total) by mouth daily.   No current facility-administered medications on file prior to visit.      Social Hx: She  reports that she has quit smoking. Her smoking use included cigarettes. She smoked 0.50 packs per day. She uses smokeless tobacco. She reports current alcohol use. She reports that she does not use drugs.    Diagnosis/Reason for Visit:   HTN  Services Requested:  Labs:  BMET   # of Visits Needed/Frequency per Week: ONCE IN 2 WEEKS   A copy of the office note will be faxed with this form. All labs ordered for this home visit have been released and the request was sent to Chrissie Noa at [facility].

## 2019-02-24 ENCOUNTER — Other Ambulatory Visit: Payer: Self-pay | Admitting: Physician Assistant

## 2019-02-24 DIAGNOSIS — I10 Essential (primary) hypertension: Secondary | ICD-10-CM

## 2019-02-24 NOTE — Addendum Note (Signed)
Addended by: Claude Manges on: 02/24/2019 11:11 AM   Modules accepted: Orders

## 2019-02-24 NOTE — Addendum Note (Signed)
Addended by: Claude Manges on: 02/24/2019 09:59 AM   Modules accepted: Orders

## 2019-02-25 ENCOUNTER — Other Ambulatory Visit: Payer: Self-pay

## 2019-02-25 ENCOUNTER — Ambulatory Visit (HOSPITAL_COMMUNITY)
Admission: RE | Admit: 2019-02-25 | Discharge: 2019-02-25 | Disposition: A | Discharge status: Home / Self Care | Payer: Medicare Other | Source: Ambulatory Visit | Attending: Internal Medicine | Admitting: Internal Medicine

## 2019-02-25 DIAGNOSIS — M79605 Pain in left leg: Secondary | ICD-10-CM | POA: Diagnosis not present

## 2019-02-25 DIAGNOSIS — M79604 Pain in right leg: Secondary | ICD-10-CM | POA: Diagnosis not present

## 2019-02-26 ENCOUNTER — Other Ambulatory Visit: Payer: Self-pay

## 2019-02-26 ENCOUNTER — Telehealth: Payer: Self-pay | Admitting: Physician Assistant

## 2019-02-26 DIAGNOSIS — Z79899 Other long term (current) drug therapy: Secondary | ICD-10-CM

## 2019-02-26 MED ORDER — SPIRONOLACTONE 25 MG PO TABS: 25.0000 mg | ORAL_TABLET | Freq: Every day | ORAL | 3 refills | Status: DC

## 2019-02-26 NOTE — Telephone Encounter (Signed)
° ° ° ° ° °  Patient states she was supposed to have a prescription for a fluid pill sent to her pharmacy, but the pharmacy has not gotten one yet.   Patient uses : [facility] [place], [place]   Patient had vascular test on her legs done yesterday.   Please send an appropriate rx, or advise the patient if no medication was required

## 2019-02-26 NOTE — Telephone Encounter (Signed)
Please make sure she has a BMET once a week x 2 scheduled for after she starts the spironolactone. Thanks, Richardson Dopp, PA-C    02/26/2019 2:53 PM

## 2019-02-26 NOTE — Telephone Encounter (Signed)
Pt stated that she was supposed to have a fluid pill sent in to her pharmacy, I do not see where pt was prescribed a fluid pill. Please address

## 2019-02-26 NOTE — Telephone Encounter (Signed)
I spoke with the patient and arranged lab (BMP) visits 7/2 and 7/9.

## 2019-02-26 NOTE — Telephone Encounter (Signed)
I spoke to patient and filled her Spironolactone 25 mg Daily as discussed with Scott at [facility] Visit last week.

## 2019-02-27 ENCOUNTER — Telehealth: Payer: Self-pay | Admitting: *Deleted

## 2019-02-27 DIAGNOSIS — M79604 Pain in right leg: Secondary | ICD-10-CM

## 2019-02-27 NOTE — Telephone Encounter (Signed)
Spoke with patient and aware of results and reccomendations 

## 2019-03-02 ENCOUNTER — Telehealth: Payer: Self-pay | Admitting: Physician Assistant

## 2019-03-02 NOTE — Telephone Encounter (Signed)
Spoke with the patient, she wanted to confirm she was taking the correct dose of medications. Reviewed medication list.

## 2019-03-02 NOTE — Telephone Encounter (Signed)
Patient would like to speak to nurse to go over her medications.

## 2019-03-03 ENCOUNTER — Telehealth: Payer: Medicare Other | Admitting: Physician Assistant

## 2019-03-04 ENCOUNTER — Telehealth: Payer: Self-pay | Admitting: *Deleted

## 2019-03-04 ENCOUNTER — Telehealth: Payer: Self-pay | Admitting: Physician Assistant

## 2019-03-04 DIAGNOSIS — E785 Hyperlipidemia, unspecified: Secondary | ICD-10-CM | POA: Diagnosis not present

## 2019-03-04 DIAGNOSIS — I10 Essential (primary) hypertension: Secondary | ICD-10-CM | POA: Diagnosis not present

## 2019-03-04 DIAGNOSIS — R790 Abnormal level of blood mineral: Secondary | ICD-10-CM | POA: Diagnosis not present

## 2019-03-04 DIAGNOSIS — Z79899 Other long term (current) drug therapy: Secondary | ICD-10-CM

## 2019-03-04 NOTE — Telephone Encounter (Signed)
New message:     Patient had blood work done with home health care. Patient would like for some to call her concerning her up coming appt for labs.

## 2019-03-04 NOTE — Telephone Encounter (Signed)
[place] Visit Initial Request  Date of Request ([place]):  March 04, 2019  Requesting Provider:  Richardson Dopp PA     Agency Requested:    [facility] Contact:  Glory Buff, NP [address] [place], [place] [address] Phone #:  [phone] Fax #:  [phone]  Patient Demographic Information: Name:  Margaret Kelly Age:  68 y.o.   DOB:  June 15, 1951  MRN:  [id number]   Address:   [place] [address]   Phone Numbers:   Home Phone [phone]  Work Phone [phone]  Mobile [phone]     Emergency Contact Information on File:   Contact Information    Name Relation Home Work [place], [place] Son   [phone]      The above family members may be contacted for information on this patient (review DPR on file):  Yes    Patient Clinical Information:  Primary Care Provider:  Lanae Boast, [place]  Primary Cardiologist:  Fransico Him, MD  Primary Electrophysiologist:  None   Past Medical Hx: Margaret Kelly  has a past medical history of Anxiety, Blood transfusion without reported diagnosis (1982), GERD (gastroesophageal reflux disease), History of hepatitis C (10/13/2015), Hyperlipidemia LDL goal <100 (10/13/2015), Hypertension, Muscle spasm of left lower extremity (10/13/2015), Nerve damage (2001), Pain of left hip joint (02/21/2018), and Plantar wart.   Allergies: She is allergic to amlodipine.   Medications: Current Outpatient Medications on File Prior to Visit  Medication Sig  . aspirin EC 81 MG tablet Take 81 mg by mouth daily.  . carvedilol (COREG) 25 MG tablet Take 1 tablet (25 mg total) by mouth 2 (two) times daily.  . cyclobenzaprine (FLEXERIL) 10 MG tablet Take 1 tablet (10 mg total) by mouth at bedtime.  Marland Kitchen ezetimibe (ZETIA) 10 MG tablet Take 1 tablet (10 mg total) by mouth daily.  Marland Kitchen losartan (COZAAR) 100 MG tablet TAKE 1 TABLET BY MOUTH DAILY  . Melatonin 10 MG CAPS Take by mouth. Takes 1 tablet by  mouth at bedtime  . Multiple Vitamin (MULTIVITAMIN) tablet Take 1 tablet by mouth daily.  . Omega-3 Fatty Acids (FISH OIL) 1200 MG CAPS Take 1,200 mg by mouth daily.   Marland Kitchen omeprazole (PRILOSEC) 40 MG capsule Take on cap po qd  . OVER THE COUNTER MEDICATION Cayenne/Turmeric/Cumin combo capsule by mouth daily  . rosuvastatin (CRESTOR) 5 MG tablet Take 1 tablet (5 mg total) by mouth daily.  Marland Kitchen spironolactone (ALDACTONE) 25 MG tablet Take 1 tablet (25 mg total) by mouth daily.   No current facility-administered medications on file prior to visit.      Social Hx: She  reports that she has quit smoking. Her smoking use included cigarettes. She smoked 0.50 packs per day. She uses smokeless tobacco. She reports current alcohol use. She reports that she does not use drugs.    Diagnosis/Reason for Visit:   HTN   Services Requested:  Labs  # of Visits Needed/Frequency per Week:  Labs:  IN ONE WEEK BY  JULY 9TH    A copy of the office note will be faxed with this form. All labs ordered for this home visit have been released and the request was sent to Chrissie Noa at [facility].

## 2019-03-05 ENCOUNTER — Other Ambulatory Visit: Payer: Medicare Other

## 2019-03-05 ENCOUNTER — Telehealth: Payer: Self-pay | Admitting: Physician Assistant

## 2019-03-05 LAB — HEPATIC FUNCTION PANEL
ALT: 15 IU/L (ref 0–32)
AST: 16 IU/L (ref 0–40)
Albumin: 4.6 g/dL (ref 3.8–4.8)
Alkaline Phosphatase: 69 IU/L (ref 39–117)
Bilirubin Total: 0.6 mg/dL (ref 0.0–1.2)
Bilirubin, Direct: 0.16 mg/dL (ref 0.00–0.40)
Total Protein: 7.1 g/dL (ref 6.0–8.5)

## 2019-03-05 LAB — BASIC METABOLIC PANEL
BUN/Creatinine Ratio: 22 (ref 12–28)
BUN: 15 mg/dL (ref 8–27)
CO2: 22 mmol/L (ref 20–29)
Calcium: 9.8 mg/dL (ref 8.7–10.3)
Chloride: 103 mmol/L (ref 96–106)
Creatinine, Ser: 0.67 mg/dL (ref 0.57–1.00)
GFR calc Af Amer: 107 mL/min/{1.73_m2} (ref >59)
GFR calc non Af Amer: 93 mL/min/{1.73_m2} (ref >59)
Glucose: 122 mg/dL — ABNORMAL HIGH (ref 65–99)
Potassium: 4.5 mmol/L (ref 3.5–5.2)
Sodium: 138 mmol/L (ref 134–144)

## 2019-03-05 LAB — LIPID PANEL
Chol/HDL Ratio: 2.7 ratio (ref 0.0–4.4)
Cholesterol, Total: 151 mg/dL (ref 100–199)
HDL: 55 mg/dL (ref >39)
LDL Calculated: 78 mg/dL (ref 0–99)
Triglycerides: 91 mg/dL (ref 0–149)
VLDL Cholesterol Cal: 18 mg/dL (ref 5–40)

## 2019-03-05 LAB — MAGNESIUM: Magnesium: 2 mg/dL (ref 1.6–2.3)

## 2019-03-05 NOTE — Telephone Encounter (Signed)
New Message   Patient is calling because she is experiencing cramps in her legs. She is wondering how much exercise she can do. Please call.

## 2019-03-09 NOTE — Telephone Encounter (Signed)
Spoke with patient and reccommended to do focus on more upper body exercises until appt in August with Dr.Arida about PAD

## 2019-03-11 ENCOUNTER — Telehealth: Payer: Self-pay | Admitting: Physician Assistant

## 2019-03-11 DIAGNOSIS — Z79899 Other long term (current) drug therapy: Secondary | ICD-10-CM | POA: Diagnosis not present

## 2019-03-11 NOTE — Telephone Encounter (Signed)
Patient called in regards to a pain in her groin area. She has been taking acetaminophen, but it has not been helping to resolve her pain.  She has a vascular issue, and would like to now if there are any other mediations that  She could be taking to relieve her pain

## 2019-03-11 NOTE — Telephone Encounter (Signed)
Spoke with patient and verbalized understanding of recommendations with walking and stopping  and will contact PCP about Groin Pain. Patient has been told to contact [facility] office to inquire about a earlier appointment if possible with leg pain complaints.

## 2019-03-11 NOTE — Telephone Encounter (Signed)
She can contact primary care about her groin pain.  Walking is a good exercise. You can often improve symptoms of peripheral vascular disease by walking until you develop pain, continue walking just a little more, then stop.  Over time, you will be able to walk further without pain.    Richardson Dopp, PA-C    03/11/2019 12:21 PM

## 2019-03-12 ENCOUNTER — Other Ambulatory Visit: Payer: Medicare Other

## 2019-03-12 LAB — BASIC METABOLIC PANEL
BUN/Creatinine Ratio: 20 (ref 12–28)
BUN: 16 mg/dL (ref 8–27)
CO2: 23 mmol/L (ref 20–29)
Calcium: 9.8 mg/dL (ref 8.7–10.3)
Chloride: 101 mmol/L (ref 96–106)
Creatinine, Ser: 0.81 mg/dL (ref 0.57–1.00)
GFR calc Af Amer: 88 mL/min/{1.73_m2} (ref >59)
GFR calc non Af Amer: 76 mL/min/{1.73_m2} (ref >59)
Glucose: 116 mg/dL — ABNORMAL HIGH (ref 65–99)
Potassium: 4.5 mmol/L (ref 3.5–5.2)
Sodium: 144 mmol/L (ref 134–144)

## 2019-03-19 ENCOUNTER — Telehealth: Payer: Self-pay | Admitting: Cardiovascular Disease

## 2019-03-19 NOTE — Telephone Encounter (Signed)

## 2019-03-20 ENCOUNTER — Ambulatory Visit (INDEPENDENT_AMBULATORY_CARE_PROVIDER_SITE_OTHER): Payer: Medicare Other | Admitting: Cardiovascular Disease

## 2019-03-20 ENCOUNTER — Other Ambulatory Visit: Payer: Self-pay

## 2019-03-20 ENCOUNTER — Encounter: Payer: Self-pay | Admitting: Cardiovascular Disease

## 2019-03-20 VITALS — BP 138/79 | HR 77 | Temp 97.4°F | Ht 66.0 in | Wt 159.0 lb

## 2019-03-20 DIAGNOSIS — I10 Essential (primary) hypertension: Secondary | ICD-10-CM | POA: Diagnosis not present

## 2019-03-20 DIAGNOSIS — I739 Peripheral vascular disease, unspecified: Secondary | ICD-10-CM | POA: Insufficient documentation

## 2019-03-20 DIAGNOSIS — Z01812 Encounter for preprocedural laboratory examination: Secondary | ICD-10-CM

## 2019-03-20 NOTE — Assessment & Plan Note (Signed)
Margaret Kelly was referred to me by Margaret Dopp, PA-C for symptomatic right calf claudication.  She is a patient of Dr. Olivia Mackie Kelly's.  She is a claudication which is lifestyle limiting for approximately 18 months.  Her risk factors include tobacco abuse, treated hypertension and hyperlipidemia.  Recent Dopplers performed 02/25/2019 revealed a right ABI of 0.57 and a left 0.85.  She had an occluded right popliteal artery.  We will plan to perform angiography and potential endovascular therapy for treatment of lifestyle limiting claudication.  I did explain the procedure in detail including risks and benefits.

## 2019-03-20 NOTE — Progress Notes (Signed)
03/20/2019 Margaret Kelly   Sep 17, 1950  [id number]  Primary Physician Margaret Kelly, [place] Primary Cardiologist: Margaret Harp MD Margaret Kelly, [place]  HPI:  Margaret Kelly is a 68 y.o. mildly overweight divorced Caucasian female mother of 2 children, grandmother and one grandchild who is happily retired from Engineer, building services.  She is was referred by Margaret Dopp, PA-C for peripheral vascular evaluation and treatment.  She has a history of continued tobacco abuse of 1/2 pack/day which she is smoked for the last 40 years, treated hypertension and hyperlipidemia currently on Crestor.  There is no family history.  She is never had a heart attack or stroke.  She has had right greater than left calf claudication over the last 18 months with recent Dopplers performed 02/25/2019 revealing a right ABI 0.57 and a left of 0.85 with an occluded right popliteal artery.   Current Meds  Medication Sig  . aspirin EC 81 MG tablet Take 81 mg by mouth daily.  . carvedilol (COREG) 25 MG tablet Take 1 tablet (25 mg total) by mouth 2 (two) times daily.  . cyclobenzaprine (FLEXERIL) 10 MG tablet Take 1 tablet (10 mg total) by mouth at bedtime.  Marland Kitchen ezetimibe (ZETIA) 10 MG tablet Take 1 tablet (10 mg total) by mouth daily.  Marland Kitchen losartan (COZAAR) 100 MG tablet TAKE 1 TABLET BY MOUTH DAILY  . Melatonin 10 MG CAPS Take by mouth. Takes 1 tablet by mouth at bedtime  . Multiple Vitamin (MULTIVITAMIN) tablet Take 1 tablet by mouth daily.  . Omega-3 Fatty Acids (FISH OIL) 1200 MG CAPS Take 1,200 mg by mouth daily.   Marland Kitchen omeprazole (PRILOSEC) 40 MG capsule Take on cap po qd  . OVER THE COUNTER MEDICATION Cayenne/Turmeric/Cumin combo capsule by mouth daily  . rosuvastatin (CRESTOR) 5 MG tablet Take 1 tablet (5 mg total) by mouth daily.  Marland Kitchen spironolactone (ALDACTONE) 25 MG tablet Take 1 tablet (25 mg total) by mouth daily.     Allergies  Allergen Reactions  . Amlodipine Other (See Comments)   Ringing in ears    Social History   Socioeconomic History  . Marital status: Divorced    Spouse name: Not on file  . Number of children: Not on file  . Years of education: Not on file  . Highest education level: Not on file  Occupational History  . Not on file  Social Needs  . Financial resource strain: Not on file  . Food insecurity    Worry: Not on file    Inability: Not on file  . Transportation needs    Medical: Not on file    Non-medical: Not on file  Tobacco Use  . Smoking status: Former Smoker    Packs/day: 0.50    Types: Cigarettes  . Smokeless tobacco: Current User  . Tobacco comment: using patch.   Substance and Sexual Activity  . Alcohol use: Yes    Comment: rare  . Drug use: No  . Sexual activity: Not on file  Lifestyle  . Physical activity    Days per week: Not on file    Minutes per session: Not on file  . Stress: Not on file  Relationships  . Social Herbalist on phone: Not on file    Gets together: Not on file    Attends religious service: Not on file    Active member of club or organization: Not on file    Attends meetings of  clubs or organizations: Not on file    Relationship status: Not on file  . Intimate partner violence    Fear of current or ex partner: Not on file    Emotionally abused: Not on file    Physically abused: Not on file    Forced sexual activity: Not on file  Other Topics Concern  . Not on file  Social History Narrative  . Not on file     Review of Systems: General: negative for chills, fever, night sweats or weight changes.  Cardiovascular: negative for chest pain, dyspnea on exertion, edema, orthopnea, palpitations, paroxysmal nocturnal dyspnea or shortness of breath Dermatological: negative for rash Respiratory: negative for cough or wheezing Urologic: negative for hematuria Abdominal: negative for nausea, vomiting, diarrhea, bright red blood per rectum, melena, or hematemesis Neurologic: negative for  visual changes, syncope, or dizziness All other systems reviewed and are otherwise negative except as noted above.    Blood pressure 138/79, pulse 77, temperature (!) 97.4 F (36.3 C), height 5\' 6"  (1.676 m), weight 159 lb (72.1 kg), SpO2 94 %.  General appearance: alert and no distress Neck: no adenopathy, no carotid bruit, no JVD, supple, symmetrical, trachea midline and thyroid not enlarged, symmetric, no tenderness/mass/nodules Lungs: clear to auscultation bilaterally Heart: regular rate and rhythm, S1, S2 normal, no murmur, click, rub or gallop Extremities: extremities normal, atraumatic, no cyanosis or edema Pulses: Diminished right pedal pulse Skin: Skin color, texture, turgor normal. No rashes or lesions Neurologic: Alert and oriented X 3, normal strength and tone. Normal symmetric reflexes. Normal coordination and gait  EKG sinus rhythm at 66 with nonspecific ST and T wave changes.  I personally reviewed this EKG.  ASSESSMENT AND PLAN:   Peripheral arterial disease ([place]) [name] was referred to me by Margaret Dopp, PA-C for symptomatic right calf claudication.  She is a patient of Dr. Olivia Mackie Kelly's.  She is a claudication which is lifestyle limiting for approximately 18 months.  Her risk factors include tobacco abuse, treated hypertension and hyperlipidemia.  Recent Dopplers performed 02/25/2019 revealed a right ABI of 0.57 and a left 0.85.  She had an occluded right popliteal artery.  We will plan to perform angiography and potential endovascular therapy for treatment of lifestyle limiting claudication.  I did explain the procedure in detail including risks and benefits.      Margaret Harp MD FACP,FACC,FAHA, [facility] 03/20/2019 10:58 AM

## 2019-03-20 NOTE — H&P (View-Only) (Signed)
03/20/2019 [name]   01/15/1951  [id number]  Primary Physician Lanae Boast, [place] Primary Cardiologist: Lorretta Harp MD [name], [place]  HPI:  Margaret Kelly is a 68 y.o. mildly overweight divorced Caucasian female mother of 2 children, grandmother and one grandchild who is happily retired from Engineer, building services.  She is was referred by Richardson Dopp, PA-C for peripheral vascular evaluation and treatment.  She has a history of continued tobacco abuse of 1/2 pack/day which she is smoked for the last 40 years, treated hypertension and hyperlipidemia currently on Crestor.  There is no family history.  She is never had a heart attack or stroke.  She has had right greater than left calf claudication over the last 18 months with recent Dopplers performed 02/25/2019 revealing a right ABI 0.57 and a left of 0.85 with an occluded right popliteal artery.   Current Meds  Medication Sig  . aspirin EC 81 MG tablet Take 81 mg by mouth daily.  . carvedilol (COREG) 25 MG tablet Take 1 tablet (25 mg total) by mouth 2 (two) times daily.  . cyclobenzaprine (FLEXERIL) 10 MG tablet Take 1 tablet (10 mg total) by mouth at bedtime.  Marland Kitchen ezetimibe (ZETIA) 10 MG tablet Take 1 tablet (10 mg total) by mouth daily.  Marland Kitchen losartan (COZAAR) 100 MG tablet TAKE 1 TABLET BY MOUTH DAILY  . Melatonin 10 MG CAPS Take by mouth. Takes 1 tablet by mouth at bedtime  . Multiple Vitamin (MULTIVITAMIN) tablet Take 1 tablet by mouth daily.  . Omega-3 Fatty Acids (FISH OIL) 1200 MG CAPS Take 1,200 mg by mouth daily.   Marland Kitchen omeprazole (PRILOSEC) 40 MG capsule Take on cap po qd  . OVER THE COUNTER MEDICATION Cayenne/Turmeric/Cumin combo capsule by mouth daily  . rosuvastatin (CRESTOR) 5 MG tablet Take 1 tablet (5 mg total) by mouth daily.  Marland Kitchen spironolactone (ALDACTONE) 25 MG tablet Take 1 tablet (25 mg total) by mouth daily.     Allergies  Allergen Reactions  . Amlodipine Other (See Comments)   Ringing in ears    Social History   Socioeconomic History  . Marital status: Divorced    Spouse name: Not on file  . Number of children: Not on file  . Years of education: Not on file  . Highest education level: Not on file  Occupational History  . Not on file  Social Needs  . Financial resource strain: Not on file  . Food insecurity    Worry: Not on file    Inability: Not on file  . Transportation needs    Medical: Not on file    Non-medical: Not on file  Tobacco Use  . Smoking status: Former Smoker    Packs/day: 0.50    Types: Cigarettes  . Smokeless tobacco: Current User  . Tobacco comment: using patch.   Substance and Sexual Activity  . Alcohol use: Yes    Comment: rare  . Drug use: No  . Sexual activity: Not on file  Lifestyle  . Physical activity    Days per week: Not on file    Minutes per session: Not on file  . Stress: Not on file  Relationships  . Social Herbalist on phone: Not on file    Gets together: Not on file    Attends religious service: Not on file    Active member of club or organization: Not on file    Attends meetings of  clubs or organizations: Not on file    Relationship status: Not on file  . Intimate partner violence    Fear of current or ex partner: Not on file    Emotionally abused: Not on file    Physically abused: Not on file    Forced sexual activity: Not on file  Other Topics Concern  . Not on file  Social History Narrative  . Not on file     Review of Systems: General: negative for chills, fever, night sweats or weight changes.  Cardiovascular: negative for chest pain, dyspnea on exertion, edema, orthopnea, palpitations, paroxysmal nocturnal dyspnea or shortness of breath Dermatological: negative for rash Respiratory: negative for cough or wheezing Urologic: negative for hematuria Abdominal: negative for nausea, vomiting, diarrhea, bright red blood per rectum, melena, or hematemesis Neurologic: negative for  visual changes, syncope, or dizziness All other systems reviewed and are otherwise negative except as noted above.    Blood pressure 138/79, pulse 77, temperature (!) 97.4 F (36.3 C), height 5\' 6"  (1.676 m), weight 159 lb (72.1 kg), SpO2 94 %.  General appearance: alert and no distress Neck: no adenopathy, no carotid bruit, no JVD, supple, symmetrical, trachea midline and thyroid not enlarged, symmetric, no tenderness/mass/nodules Lungs: clear to auscultation bilaterally Heart: regular rate and rhythm, S1, S2 normal, no murmur, click, rub or gallop Extremities: extremities normal, atraumatic, no cyanosis or edema Pulses: Diminished right pedal pulse Skin: Skin color, texture, turgor normal. No rashes or lesions Neurologic: Alert and oriented X 3, normal strength and tone. Normal symmetric reflexes. Normal coordination and gait  EKG sinus rhythm at 66 with nonspecific ST and T wave changes.  I personally reviewed this EKG.  ASSESSMENT AND PLAN:   Peripheral arterial disease ([place]) [name] was referred to me by Richardson Dopp, PA-C for symptomatic right calf claudication.  She is a patient of Dr. Olivia Mackie Turner's.  She is a claudication which is lifestyle limiting for approximately 18 months.  Her risk factors include tobacco abuse, treated hypertension and hyperlipidemia.  Recent Dopplers performed 02/25/2019 revealed a right ABI of 0.57 and a left 0.85.  She had an occluded right popliteal artery.  We will plan to perform angiography and potential endovascular therapy for treatment of lifestyle limiting claudication.  I did explain the procedure in detail including risks and benefits.      Lorretta Harp MD FACP,FACC,FAHA, [facility] 03/20/2019 10:58 AM

## 2019-03-20 NOTE — Patient Instructions (Addendum)
    [place] [address] Dept: [phone] Loc: [phone]  Margaret Kelly  03/20/2019  You are scheduled for a Peripheral Angiogram on Thursday, July 23 with Dr. Quay Burow.  1. Please arrive at the [facility] (Main Entrance A) at [facility]: [address] [place], [place] [address] at 11:30 AM (This time is two hours before your procedure to ensure your preparation). Free valet parking service is available.   Special note: Every effort is made to have your procedure done on time. Please understand that emergencies sometimes delay scheduled procedures.  2. Diet: Do not eat solid foods after midnight.  The patient may have clear liquids until 5am upon the day of the procedure.  3. Labs: You will need to have blood drawn today and will have a COVID-19 test done 03/23/2019  HeartCare at [facility] [address], [place], [place] [address] FOR YOUR BASIC METABOLIC PANEL, COMPLETE BLOOD COUNT, AND THYROID STIMULATING HORMONE LAB WORK. NO APPOINTMENT IS NEEDED. YOU MUST HAVE THIS LAB WORK DONE BEFORE GOING TO GET YOUR COVID-19 TEST DONE BECAUSE YOU WILL NEED TO QUARANTINE YOURSELF AFTER THE COVID-19 TEST UNTIL THE DAY OF YOUR [place].  Go to: [facility]  [address] [place], [place] [address] FOR YOUR COVID-19 TEST. YOU MUST HAVE YOUR COVID-19 TEST COMPLETED 3 DAYS PRIOR TO YOUR UPCOMING PROCEDURE/TEST. YOU WILL NEED AN APPOINTMENT. YOU ARE SCHEDULED FOR 03/23/2019 AT 12:45PM. YOU WILL ALSO NEED TO QUARANTINE YOURSELF AFTER THE COVID-19 TEST UNTIL THE DAY OF YOUR PROCEDURE/TEST. RESULTS WILL  BE POSTED IN OUR SYSTEM IN 48 HOURS OR LESS.   4. Medication instructions in preparation for your procedure:  Hold your spironolactone on the morning of your procedure.  On the morning of your  procedure, take your Aspirin and any morning medicines NOT listed above.  You may use sips of water.  5. Plan for one night stay--bring personal belongings. 6. Bring a current list of your medications and current insurance cards. 7. You MUST have a responsible person to drive you home. 8. Someone MUST be with you the first 24 hours after you arrive home or your discharge will be delayed. 9. Please wear clothes that are easy to get on and off and wear slip-on shoes.  TESTS: Your physician has requested that you have an aorta/iliac duplex. During this test, an ultrasound is used to evaluate blood flow to the aorta and iliac arteries. Allow one hour for this exam. Do not eat after midnight the day before and avoid carbonated beverages. TO BE SCHEDULED APPROXIMATELY 1 WEEK AFTER YOUR PROCEDURE  Your physician has requested that you have an ankle brachial index (ABI). During this test an ultrasound and blood pressure cuff are used to evaluate the arteries that supply the arms and legs with blood. Allow thirty minutes for this exam. There are no restrictions or special instructions. TO BE SCHEDULED APPROXIMATELY 1 WEEK AFTER YOUR PROCEDURE  FOLLOW UP:  YOU WILL NEED TO SCHEDULE A FOLLOW UP APPOINTMENT FOR APPROXIMATELY 2-3 WEEKS AFTER YOUR PROCEDURE  Thank you for allowing [place] to care for you!   -- [facility]

## 2019-03-21 ENCOUNTER — Telehealth: Payer: Self-pay | Admitting: Internal Medicine

## 2019-03-21 DIAGNOSIS — E875 Hyperkalemia: Secondary | ICD-10-CM

## 2019-03-21 LAB — BASIC METABOLIC PANEL
BUN/Creatinine Ratio: 21 (ref 12–28)
BUN: 18 mg/dL (ref 8–27)
CO2: 23 mmol/L (ref 20–29)
Calcium: 11 mg/dL — ABNORMAL HIGH (ref 8.7–10.3)
Chloride: 101 mmol/L (ref 96–106)
Creatinine, Ser: 0.87 mg/dL (ref 0.57–1.00)
GFR calc Af Amer: 81 mL/min/{1.73_m2} (ref >59)
GFR calc non Af Amer: 70 mL/min/{1.73_m2} (ref >59)
Glucose: 140 mg/dL — ABNORMAL HIGH (ref 65–99)
Potassium: 6.1 mmol/L (ref 3.5–5.2)
Sodium: 141 mmol/L (ref 134–144)

## 2019-03-21 LAB — CBC
Hematocrit: 45.3 % (ref 34.0–46.6)
Hemoglobin: 15.6 g/dL (ref 11.1–15.9)
MCH: 31.3 pg (ref 26.6–33.0)
MCHC: 34.4 g/dL (ref 31.5–35.7)
MCV: 91 fL (ref 79–97)
Platelets: 365 10*3/uL (ref 150–450)
RBC: 4.98 x10E6/uL (ref 3.77–5.28)
RDW: 12.4 % (ref 11.7–15.4)
WBC: 9.9 10*3/uL (ref 3.4–10.8)

## 2019-03-21 LAB — TSH: TSH: 1.37 u[IU]/mL (ref 0.450–4.500)

## 2019-03-21 NOTE — Telephone Encounter (Signed)
Patient needs repeat be met for her elevated potassium to see if it was a hemolyzed sample. 

## 2019-03-21 NOTE — Telephone Encounter (Signed)
Paged by [facility] overnight regarding critical potassium of 6.1, no hemolysis and other results were normal. She takes losartan 100mg  and low-dose Aldactone.   Called twice this morning with no answer. Left message to call us back before taking morning medications.

## 2019-03-23 ENCOUNTER — Telehealth: Payer: Self-pay | Admitting: Cardiovascular Disease

## 2019-03-23 ENCOUNTER — Other Ambulatory Visit (HOSPITAL_COMMUNITY)
Admission: RE | Admit: 2019-03-23 | Discharge: 2019-03-23 | Disposition: A | Discharge status: Home / Self Care | Payer: Medicare Other | Source: Ambulatory Visit | Attending: Cardiovascular Disease | Admitting: Cardiovascular Disease

## 2019-03-23 ENCOUNTER — Telehealth: Payer: Self-pay | Admitting: *Deleted

## 2019-03-23 ENCOUNTER — Other Ambulatory Visit: Payer: Self-pay

## 2019-03-23 DIAGNOSIS — Z1159 Encounter for screening for other viral diseases: Secondary | ICD-10-CM | POA: Insufficient documentation

## 2019-03-23 DIAGNOSIS — Z79899 Other long term (current) drug therapy: Secondary | ICD-10-CM | POA: Diagnosis not present

## 2019-03-23 LAB — BASIC METABOLIC PANEL
BUN/Creatinine Ratio: 20 (ref 12–28)
BUN: 16 mg/dL (ref 8–27)
CO2: 23 mmol/L (ref 20–29)
Calcium: 10.3 mg/dL (ref 8.7–10.3)
Chloride: 100 mmol/L (ref 96–106)
Creatinine, Ser: 0.81 mg/dL (ref 0.57–1.00)
GFR calc Af Amer: 88 mL/min/{1.73_m2} (ref >59)
GFR calc non Af Amer: 76 mL/min/{1.73_m2} (ref >59)
Glucose: 119 mg/dL — ABNORMAL HIGH (ref 65–99)
Potassium: 5.9 mmol/L (ref 3.5–5.2)
Sodium: 137 mmol/L (ref 134–144)

## 2019-03-23 LAB — SARS CORONAVIRUS 2 (TAT 6-24 HRS): SARS Coronavirus 2: NEGATIVE

## 2019-03-23 NOTE — Telephone Encounter (Signed)
Called patient, advised I did not see a note for her to hold any medications prior to the blood work as they feel it was a hemolyzed sample and to redraw.  Patient verbalized understanding,

## 2019-03-23 NOTE — Addendum Note (Signed)
Addended by: Annita Brod on: 03/23/2019 09:29 AM   Modules accepted: Orders

## 2019-03-23 NOTE — Telephone Encounter (Signed)
New Message    Patient wants to know if she should get BMP done without taking her medication or will she be fine to take her medication.

## 2019-03-23 NOTE — Telephone Encounter (Signed)
Pt notified.  I spoke with cath lab and they will make note that pt needs BMP day of procedure.  Order will need to be placed in pre procedure orders.

## 2019-03-23 NOTE — Telephone Encounter (Signed)
Spoke with pt and informed her that dr. Gwenlyn Found would like her to repeat BMET d/t K+ of 6.1 for 7/17 lab. Pt states she takes her spironolactone as prescribed. She states she drinks water regularly and doesn't eat many foods high in K+ except for spinach. Pt wanted to know if she could have labs done at PCP office. Informed her that she may if they will accept Dr. Kennon Holter order or if PCP will order repeat BMP based on 7/17 results. Otherwise, pt may present to [facility] to have repeat BMP. She states she will present to [facility] around 11:30am today then go to [facility] for COVID test.

## 2019-03-23 NOTE — Telephone Encounter (Signed)
-----   Message from Liliane Shi, [place] sent at 03/23/2019  4:43 PM EDT ----- Notes recorded by Liliane Shi, PA-C on 03/23/2019 at 4:22 PM EDT  Potassium remains elevated. Creatinine normal.  Recommendations:  - Stop taking Spironolactone  - Hold Losartan x 1 day, then resume  - reduce dietary potassium  - Repeat BMET AM of procedure (03/26/2019) - Ask hospital to draw it when she arrives for LE angiogram  - I will fwd to Dr. Gwenlyn Found as Juluis Rainier as well  Richardson Dopp, PA-C   03/23/2019 4:19 PM

## 2019-03-24 ENCOUNTER — Other Ambulatory Visit: Payer: Self-pay | Admitting: *Deleted

## 2019-03-24 NOTE — Telephone Encounter (Signed)
I have placed order for STAT BMP to be done at hospital on arrival day of procedure 03/26/19.

## 2019-03-24 NOTE — Addendum Note (Signed)
Addended by: Diana Eves on: 03/24/2019 02:12 PM   Modules accepted: Orders

## 2019-03-25 ENCOUNTER — Telehealth: Payer: Self-pay | Admitting: *Deleted

## 2019-03-25 ENCOUNTER — Other Ambulatory Visit: Payer: Self-pay

## 2019-03-25 DIAGNOSIS — I739 Peripheral vascular disease, unspecified: Secondary | ICD-10-CM

## 2019-03-25 NOTE — Telephone Encounter (Addendum)
Pt contacted pre abdominal aortogram  scheduled at [facility] for: Thursday March 26, 2019 1:30 PM Verified arrival time and place: [place] Entrance A at: 11:30 AM   Covid-19 test date: 03/23/19  No solid food after midnight prior to cath, clear liquids until 5 AM day of procedure. Contrast allergy: no   AM meds can be  taken pre-cath with sip of water including: ASA 81 mg   Confirmed patient has responsible person to drive home post procedure and observe 24 hours after arriving home: yes  Due to Covid-19 pandemic, only one support person will be allowed with patient. Must be the same support person for that patient's entire stay. On arrival the support person will be required to wear a mask and will be screened, including having temporal temperature checked (below 100.4 to be cleared). They will be required to wait in the [facility] waiting room for the duration of the procedure. To limit exposure, MDs will continue to call support person after the procedure instead of speaking with them in consult room.   Patients are required to wear a mask when they enter the hospital.      COVID-19 Pre-Screening Questions:  . In the past 7 to 10 days have you had a cough,  shortness of breath, headache, congestion, fever (100 or greater) body aches, chills, sore throat, or sudden loss of taste or sense of smell? no . Have you been around anyone with known Covid 19? no . Have you been around anyone who is awaiting Covid 19 test results in the past 7 to 10 days? no . Have you been around anyone who has been exposed to Covid 19, or has mentioned symptoms of Covid 19 within the past 7 to 10 days? no   I reviewed procedure,mask, visitor instructions, Covid-19 screening questions with patient, she verbalized understanding, thanked me for call.

## 2019-03-26 ENCOUNTER — Encounter (HOSPITAL_COMMUNITY)
Admission: RE | Disposition: A | Discharge status: Home / Self Care | Payer: Medicare Other | Source: Home / Self Care | Attending: Cardiovascular Disease

## 2019-03-26 ENCOUNTER — Other Ambulatory Visit: Payer: Self-pay

## 2019-03-26 ENCOUNTER — Ambulatory Visit (HOSPITAL_COMMUNITY)
Admission: RE | Admit: 2019-03-26 | Discharge: 2019-03-26 | Disposition: A | Discharge status: Home / Self Care | Payer: Medicare Other | Attending: Cardiovascular Disease | Admitting: Cardiovascular Disease

## 2019-03-26 DIAGNOSIS — E785 Hyperlipidemia, unspecified: Secondary | ICD-10-CM | POA: Diagnosis not present

## 2019-03-26 DIAGNOSIS — Z87891 Personal history of nicotine dependence: Secondary | ICD-10-CM | POA: Insufficient documentation

## 2019-03-26 DIAGNOSIS — I70211 Atherosclerosis of native arteries of extremities with intermittent claudication, right leg: Secondary | ICD-10-CM | POA: Diagnosis not present

## 2019-03-26 DIAGNOSIS — I739 Peripheral vascular disease, unspecified: Secondary | ICD-10-CM | POA: Diagnosis present

## 2019-03-26 DIAGNOSIS — Z79899 Other long term (current) drug therapy: Secondary | ICD-10-CM | POA: Diagnosis not present

## 2019-03-26 DIAGNOSIS — Z7982 Long term (current) use of aspirin: Secondary | ICD-10-CM | POA: Diagnosis not present

## 2019-03-26 DIAGNOSIS — I70203 Unspecified atherosclerosis of native arteries of extremities, bilateral legs: Secondary | ICD-10-CM | POA: Diagnosis not present

## 2019-03-26 DIAGNOSIS — I10 Essential (primary) hypertension: Secondary | ICD-10-CM | POA: Diagnosis not present

## 2019-03-26 DIAGNOSIS — Z888 Allergy status to other drugs, medicaments and biological substances status: Secondary | ICD-10-CM | POA: Diagnosis not present

## 2019-03-26 HISTORY — PX: ABDOMINAL AORTOGRAM W/LOWER EXTREMITY: CATH118223

## 2019-03-26 LAB — BASIC METABOLIC PANEL
Anion gap: 8 (ref 5–15)
BUN: 15 mg/dL (ref 8–23)
CO2: 27 mmol/L (ref 22–32)
Calcium: 10 mg/dL (ref 8.9–10.3)
Chloride: 105 mmol/L (ref 98–111)
Creatinine, Ser: 0.77 mg/dL (ref 0.44–1.00)
GFR calc Af Amer: >60 mL/min (ref >60)
GFR calc non Af Amer: >60 mL/min (ref >60)
Glucose, Bld: 133 mg/dL — ABNORMAL HIGH (ref 70–99)
Potassium: 5.1 mmol/L (ref 3.5–5.1)
Sodium: 140 mmol/L (ref 135–145)

## 2019-03-26 SURGERY — ABDOMINAL AORTOGRAM W/LOWER EXTREMITY
Anesthesia: LOCAL | Laterality: Bilateral

## 2019-03-26 MED ORDER — SODIUM CHLORIDE 0.9% FLUSH: 3.0000 mL | Freq: Two times a day (BID) | INTRAVENOUS | Status: DC

## 2019-03-26 MED ORDER — ONDANSETRON HCL 4 MG/2ML IJ SOLN: 4.0000 mg | Freq: Four times a day (QID) | INTRAMUSCULAR | Status: DC | PRN

## 2019-03-26 MED ORDER — SODIUM CHLORIDE 0.9% FLUSH: 3.0000 mL | INTRAVENOUS | Status: DC | PRN

## 2019-03-26 MED ORDER — ASPIRIN 81 MG PO CHEW: 81.0000 mg | CHEWABLE_TABLET | ORAL | Status: DC

## 2019-03-26 MED ORDER — SODIUM CHLORIDE 0.9 % IV SOLN: 250.0000 mL | INTRAVENOUS | Status: DC | PRN

## 2019-03-26 MED ORDER — FENTANYL CITRATE (PF) 100 MCG/2ML IJ SOLN
INTRAMUSCULAR | Status: AC
  Filled 2019-03-26: qty 2

## 2019-03-26 MED ORDER — MIDAZOLAM HCL 2 MG/2ML IJ SOLN
INTRAMUSCULAR | Status: DC | PRN
  Administered 2019-03-26: 1 mg via INTRAVENOUS

## 2019-03-26 MED ORDER — IODIXANOL 320 MG/ML IV SOLN
INTRAVENOUS | Status: DC | PRN
  Administered 2019-03-26: 140 mL via INTRA_ARTERIAL

## 2019-03-26 MED ORDER — MIDAZOLAM HCL 2 MG/2ML IJ SOLN
INTRAMUSCULAR | Status: AC
  Filled 2019-03-26: qty 2

## 2019-03-26 MED ORDER — LIDOCAINE HCL (PF) 1 % IJ SOLN
INTRAMUSCULAR | Status: DC | PRN
  Administered 2019-03-26: 25 mL

## 2019-03-26 MED ORDER — HYDRALAZINE HCL 20 MG/ML IJ SOLN
INTRAMUSCULAR | Status: DC | PRN
  Administered 2019-03-26: 10 mg via INTRAVENOUS

## 2019-03-26 MED ORDER — HYDRALAZINE HCL 20 MG/ML IJ SOLN
INTRAMUSCULAR | Status: AC
  Filled 2019-03-26: qty 1

## 2019-03-26 MED ORDER — MORPHINE SULFATE (PF) 10 MG/ML IV SOLN: 2.0000 mg | INTRAVENOUS | Status: DC | PRN

## 2019-03-26 MED ORDER — HEPARIN (PORCINE) IN NACL 1000-0.9 UT/500ML-% IV SOLN
INTRAVENOUS | Status: AC
  Filled 2019-03-26: qty 1000

## 2019-03-26 MED ORDER — ACETAMINOPHEN 325 MG PO TABS: 650.0000 mg | ORAL_TABLET | ORAL | Status: DC | PRN

## 2019-03-26 MED ORDER — ASPIRIN EC 81 MG PO TBEC: 81.0000 mg | DELAYED_RELEASE_TABLET | Freq: Every day | ORAL | Status: DC

## 2019-03-26 MED ORDER — SODIUM CHLORIDE 0.9 % WEIGHT BASED INFUSION
3.0000 mL/kg/h | INTRAVENOUS | Status: AC
  Administered 2019-03-26: 3 mL/kg/h via INTRAVENOUS

## 2019-03-26 MED ORDER — SODIUM CHLORIDE 0.9 % IV SOLN: INTRAVENOUS | Status: AC

## 2019-03-26 MED ORDER — LABETALOL HCL 5 MG/ML IV SOLN: 10.0000 mg | INTRAVENOUS | Status: DC | PRN

## 2019-03-26 MED ORDER — SODIUM CHLORIDE 0.9 % WEIGHT BASED INFUSION: 1.0000 mL/kg/h | INTRAVENOUS | Status: DC

## 2019-03-26 MED ORDER — FENTANYL CITRATE (PF) 100 MCG/2ML IJ SOLN
INTRAMUSCULAR | Status: DC | PRN
  Administered 2019-03-26: 25 ug via INTRAVENOUS

## 2019-03-26 MED ORDER — HEPARIN (PORCINE) IN NACL 1000-0.9 UT/500ML-% IV SOLN
INTRAVENOUS | Status: DC | PRN
  Administered 2019-03-26 (×2): 500 mL

## 2019-03-26 MED ORDER — HYDRALAZINE HCL 20 MG/ML IJ SOLN: 5.0000 mg | INTRAMUSCULAR | Status: DC | PRN

## 2019-03-26 MED ORDER — LIDOCAINE HCL (PF) 1 % IJ SOLN
INTRAMUSCULAR | Status: AC
  Filled 2019-03-26: qty 30

## 2019-03-26 SURGICAL SUPPLY — 9 items
CATH ANGIO 5F PIGTAIL 65CM (CATHETERS) ×2 IMPLANT
CLOSURE MYNX CONTROL 5F (Vascular Products) ×2 IMPLANT
KIT PV (KITS) ×2 IMPLANT
SHEATH PINNACLE 5F 10CM (SHEATH) ×2 IMPLANT
SYR MEDRAD MARK 7 150ML (SYRINGE) ×2 IMPLANT
TRANSDUCER W/STOPCOCK (MISCELLANEOUS) ×2 IMPLANT
TRAY PV CATH (CUSTOM PROCEDURE TRAY) ×2 IMPLANT
TUBING CIL FLEX 10 FLL-RA (TUBING) ×2 IMPLANT
WIRE HITORQ VERSACORE ST 145CM (WIRE) ×2 IMPLANT

## 2019-03-26 NOTE — Interval H&P Note (Signed)
History and Physical Interval Note:  03/26/2019 3:10 PM  Margaret Kelly  has presented today for surgery, with the diagnosis of PAD.  The various methods of treatment have been discussed with the patient and family. After consideration of risks, benefits and other options for treatment, the patient has consented to  Procedure(s): ABDOMINAL AORTOGRAM W/LOWER EXTREMITY (Bilateral) as a surgical intervention.  The patient's history has been reviewed, patient examined, no change in status, stable for surgery.  I have reviewed the patient's chart and labs.  Questions were answered to the patient's satisfaction.     Quay Burow

## 2019-03-26 NOTE — Discharge Instructions (Signed)
° °  DRINK PLENTY OF FLUIDS FOR THE NEXT 2-3 DAYS TO KEEP HYDRATED.  Femoral Site Care This sheet gives you information about how to care for yourself after your procedure. Your health care provider may also give you more specific instructions. If you have problems or questions, contact your health care provider. What can I expect after the procedure? After the procedure, it is common to have:  Bruising that usually fades within 1-2 weeks.  Tenderness at the site. Follow these instructions at home: Wound care  Follow instructions from your health care provider about how to take care of your insertion site. Make sure you: ? Wash your hands with soap and water before you change your bandage (dressing). If soap and water are not available, use hand sanitizer. ? Change your dressing as told by your health care provider.  Do not take baths, swim, or use a hot tub for 5 days.  You may shower 24-48 hours after the procedure or as told by your health care provider. ? Gently wash the site with plain soap and water. ? Pat the area dry with a clean towel. ? Do not rub the site. This may cause bleeding.  Do not apply powder or lotion to the site. Keep the site clean and dry.  Check your femoral site every day for signs of infection. Check for: ? Redness, swelling, or pain. ? Fluid or blood. ? Warmth. ? Pus or a bad smell. Activity  For the first 2-3 days after your procedure, or as long as directed: ? Avoid climbing stairs as much as possible. ? Do not squat.  Do not lift anything that is heavier than 10 lb for 5 days.  Rest as directed. ? Avoid sitting for a long time without moving. Get up to take short walks every 1-2 hours.  Do not drive for 24 hours if you were given a medicine to help you relax (sedative). General instructions  Take over-the-counter and prescription medicines only as told by your health care provider.  Keep all follow-up visits as told by your health care  provider. This is important. Contact a health care provider if you have:  A fever or chills.  You have redness, swelling, or pain around your insertion site. Get help right away if:  The catheter insertion area swells very fast.  You pass out.  You suddenly start to sweat or your skin gets clammy.  The catheter insertion area is bleeding, and the bleeding does not stop when you hold steady pressure on the area.  The area near or just beyond the catheter insertion site becomes pale, cool, tingly, or numb. These symptoms may represent a serious problem that is an emergency. Do not wait to see if the symptoms will go away. Get medical help right away. Call your local emergency services (911 in the [place] Do not drive yourself to the hospital. Summary  After the procedure, it is common to have bruising that usually fades within 1-2 weeks.  Check your femoral site every day for signs of infection.  Do not lift anything that is heavier than 10 lb for 5 days.  This information is not intended to replace advice given to you by your health care provider. Make sure you discuss any questions you have with your health care provider. Document Released: 04/23/2014 Document Revised: 09/02/2017 Document Reviewed: 09/02/2017 Elsevier Patient Education  2020 [employer].

## 2019-03-27 ENCOUNTER — Encounter (HOSPITAL_COMMUNITY): Payer: Self-pay | Admitting: Cardiovascular Disease

## 2019-04-02 ENCOUNTER — Encounter (HOSPITAL_COMMUNITY): Payer: Self-pay | Admitting: Cardiovascular Disease

## 2019-04-08 ENCOUNTER — Ambulatory Visit: Payer: Medicare Other | Admitting: Cardiovascular Disease

## 2019-04-10 ENCOUNTER — Encounter (HOSPITAL_COMMUNITY): Payer: Medicare Other

## 2019-04-15 ENCOUNTER — Ambulatory Visit (INDEPENDENT_AMBULATORY_CARE_PROVIDER_SITE_OTHER): Payer: Medicare Other | Admitting: Cardiovascular Disease

## 2019-04-15 ENCOUNTER — Other Ambulatory Visit: Payer: Self-pay

## 2019-04-15 ENCOUNTER — Encounter: Payer: Self-pay | Admitting: Cardiovascular Disease

## 2019-04-15 DIAGNOSIS — I739 Peripheral vascular disease, unspecified: Secondary | ICD-10-CM

## 2019-04-15 MED ORDER — CILOSTAZOL 50 MG PO TABS: 50.0000 mg | ORAL_TABLET | Freq: Two times a day (BID) | ORAL | 3 refills | Status: DC

## 2019-04-15 NOTE — Assessment & Plan Note (Signed)
Margaret Kelly returns today for follow-up of her recent peripheral angiogram which I performed an evaluation of lifestyle and claudication 03/26/2019.  She has an occluded popliteal artery beginning above the knee and extending down into the origin of all 3 tibial vessels on the right with 70% segmental proximal and 90% mid right SFA stenosis.  She also has a 99% calcified left above-the-knee popliteal artery stenosis although she is much more symptomatic on the right side.  There are really no endovascular options on the right side other than fem-tib bypass grafting which I would not suggest since this is not critical limb ischemia.  I am to put her on Pletal 50 mg p.o. twice daily and give her a 58-month pharmacologic trial.  I will see her back after that for further evaluation.

## 2019-04-15 NOTE — Progress Notes (Signed)
04/15/2019 [name]   1950/10/11  [id number]  Primary Physician Lanae Boast, [place] Primary Cardiologist: Lorretta Harp MD [name], [place]  HPI:  Margaret SUIRE is a 68 y.o.  mildly overweight divorced Caucasian female mother of 2 children, grandmother and one grandchild who is happily retired from Engineer, building services.  She is was referred by Richardson Dopp, PA-C for peripheral vascular evaluation and treatment.    I last saw her in the office 03/20/2019.  She has a history of continued tobacco abuse of 1/2 pack/day which she is smoked for the last 40 years, treated hypertension and hyperlipidemia currently on Crestor.  There is no family history.  She is never had a heart attack or stroke.  She has had right greater than left calf claudication over the last 18 months with recent Dopplers performed 02/25/2019 revealing a right ABI 0.57 and a left of 0.85 with an occluded right popliteal artery.  I performed peripheral angiography on her 03/26/2019 revealing an occluded right popliteal artery beginning above the knee and extending down into all 3 proximal tibial arteries.  She did have 90% mid right SFA stenosis as well as a 95% calcified left popliteal artery stenosis.  I do not think that she had endovascular options on the right, the site that she is most symptomatic from.  She is trying to stop smoking.  We decided to try Pletal for 15-month trial.  I do not think she is a surgical candidate for treatment of claudication.   No outpatient medications have been marked as taking for the 04/15/19 encounter (Office Visit) with Lorretta Harp, MD.     Allergies  Allergen Reactions  . Amlodipine Other (See Comments)    Ringing in ears    Social History   Socioeconomic History  . Marital status: Divorced    Spouse name: Not on file  . Number of children: Not on file  . Years of education: Not on file  . Highest education level: Not on file  Occupational  History  . Not on file  Social Needs  . Financial resource strain: Not on file  . Food insecurity    Worry: Not on file    Inability: Not on file  . Transportation needs    Medical: Not on file    Non-medical: Not on file  Tobacco Use  . Smoking status: Current Every Day Smoker    Packs/day: 0.50    Types: Cigarettes  . Smokeless tobacco: Former Systems developer  . Tobacco comment: about half a pack per day  Substance and Sexual Activity  . Alcohol use: Yes    Comment: rare  . Drug use: No  . Sexual activity: Not on file  Lifestyle  . Physical activity    Days per week: Not on file    Minutes per session: Not on file  . Stress: Not on file  Relationships  . Social Herbalist on phone: Not on file    Gets together: Not on file    Attends religious service: Not on file    Active member of club or organization: Not on file    Attends meetings of clubs or organizations: Not on file    Relationship status: Not on file  . Intimate partner violence    Fear of current or ex partner: Not on file    Emotionally abused: Not on file    Physically abused: Not on file  Forced sexual activity: Not on file  Other Topics Concern  . Not on file  Social History Narrative  . Not on file     Review of Systems: General: negative for chills, fever, night sweats or weight changes.  Cardiovascular: negative for chest pain, dyspnea on exertion, edema, orthopnea, palpitations, paroxysmal nocturnal dyspnea or shortness of breath Dermatological: negative for rash Respiratory: negative for cough or wheezing Urologic: negative for hematuria Abdominal: negative for nausea, vomiting, diarrhea, bright red blood per rectum, melena, or hematemesis Neurologic: negative for visual changes, syncope, or dizziness All other systems reviewed and are otherwise negative except as noted above.    Blood pressure (!) 142/94, pulse 69, temperature (!) 97.3 F (36.3 C), temperature source Temporal, height  5\' 6"  (1.676 m), weight 160 lb 3.2 oz (72.7 kg).  General appearance: alert and no distress Neck: no adenopathy, no carotid bruit, no JVD, supple, symmetrical, trachea midline and thyroid not enlarged, symmetric, no tenderness/mass/nodules Lungs: clear to auscultation bilaterally Heart: regular rate and rhythm, S1, S2 normal, no murmur, click, rub or gallop Extremities: extremities normal, atraumatic, no cyanosis or edema Pulses: Right Posterior Tibial absent Skin: Skin color, texture, turgor normal. No rashes or lesions Neurologic: Alert and oriented X 3, normal strength and tone. Normal symmetric reflexes. Normal coordination and gait  EKG not performed today  ASSESSMENT AND PLAN:   Peripheral arterial disease ([place]) Ms. Niday returns today for follow-up of her recent peripheral angiogram which I performed an evaluation of lifestyle and claudication 03/26/2019.  She has an occluded popliteal artery beginning above the knee and extending down into the origin of all 3 tibial vessels on the right with 70% segmental proximal and 90% mid right SFA stenosis.  She also has a 99% calcified left above-the-knee popliteal artery stenosis although she is much more symptomatic on the right side.  There are really no endovascular options on the right side other than fem-tib bypass grafting which I would not suggest since this is not critical limb ischemia.  I am to put her on Pletal 50 mg p.o. twice daily and give her a 46-month pharmacologic trial.  I will see her back after that for further evaluation.      Lorretta Harp MD FACP,FACC,FAHA, [facility] 04/15/2019 2:27 PM

## 2019-04-15 NOTE — Patient Instructions (Signed)
Medication Instructions:  START PLETAL 50 MG BY MOUTH TWICE A DAY If you need a refill on your cardiac medications before your next appointment, please call your pharmacy.   Lab work: NONE If you have labs (blood work) drawn today and your tests are completely normal, you will receive your results only by: Marland Kitchen MyChart Message (if you have MyChart) OR . A paper copy in the mail If you have any lab test that is abnormal or we need to change your treatment, we will call you to review the results.  Testing/Procedures: NONE  Follow-Up: At [facility], you and your health needs are our priority.  As part of our continuing mission to provide you with exceptional heart care, we have created designated Provider Care Teams.  These Care Teams include your primary Cardiologist (physician) and Advanced Practice Providers (APPs -  Physician Assistants and Nurse Practitioners) who all work together to provide you with the care you need, when you need it. You will need a follow up appointment in 3 months with Dr. Quay Burow.  Please call our office 2 months in advance to schedule this/each appointment.

## 2019-04-28 ENCOUNTER — Ambulatory Visit: Payer: Medicare Other | Admitting: Cardiovascular Disease

## 2019-05-01 ENCOUNTER — Other Ambulatory Visit: Payer: Self-pay

## 2019-05-01 ENCOUNTER — Other Ambulatory Visit: Payer: Medicare Other | Admitting: *Deleted

## 2019-05-01 ENCOUNTER — Other Ambulatory Visit: Payer: Medicare Other

## 2019-05-01 DIAGNOSIS — E785 Hyperlipidemia, unspecified: Secondary | ICD-10-CM | POA: Diagnosis not present

## 2019-05-01 LAB — HEPATIC FUNCTION PANEL
ALT: 15 IU/L (ref 0–32)
AST: 19 IU/L (ref 0–40)
Albumin: 4.7 g/dL (ref 3.8–4.8)
Alkaline Phosphatase: 70 IU/L (ref 39–117)
Bilirubin Total: 0.5 mg/dL (ref 0.0–1.2)
Bilirubin, Direct: 0.13 mg/dL (ref 0.00–0.40)
Total Protein: 6.9 g/dL (ref 6.0–8.5)

## 2019-05-01 LAB — LIPID PANEL
Chol/HDL Ratio: 3 ratio (ref 0.0–4.4)
Cholesterol, Total: 164 mg/dL (ref 100–199)
HDL: 55 mg/dL (ref >39)
LDL Calculated: 84 mg/dL (ref 0–99)
Triglycerides: 123 mg/dL (ref 0–149)
VLDL Cholesterol Cal: 25 mg/dL (ref 5–40)

## 2019-05-04 ENCOUNTER — Other Ambulatory Visit: Payer: Self-pay

## 2019-05-04 ENCOUNTER — Encounter: Payer: Self-pay | Admitting: Family Medicine

## 2019-05-04 ENCOUNTER — Ambulatory Visit: Payer: Medicare Other | Admitting: Family Medicine

## 2019-05-04 ENCOUNTER — Telehealth: Payer: Self-pay | Admitting: Cardiology

## 2019-05-04 ENCOUNTER — Ambulatory Visit (INDEPENDENT_AMBULATORY_CARE_PROVIDER_SITE_OTHER): Payer: Medicare Other | Admitting: Family Medicine

## 2019-05-04 VITALS — BP 142/72 | HR 67 | Temp 98.7°F | Resp 16 | Ht 66.0 in | Wt 158.0 lb

## 2019-05-04 DIAGNOSIS — E785 Hyperlipidemia, unspecified: Secondary | ICD-10-CM

## 2019-05-04 DIAGNOSIS — I10 Essential (primary) hypertension: Secondary | ICD-10-CM

## 2019-05-04 DIAGNOSIS — I739 Peripheral vascular disease, unspecified: Secondary | ICD-10-CM | POA: Diagnosis not present

## 2019-05-04 LAB — POCT URINALYSIS DIPSTICK
Bilirubin, UA: NEGATIVE
Glucose, UA: NEGATIVE
Ketones, UA: NEGATIVE
Leukocytes, UA: NEGATIVE
Nitrite, UA: NEGATIVE
Protein, UA: NEGATIVE
Spec Grav, UA: 1.015 (ref 1.010–1.025)
Urobilinogen, UA: 0.2 E.U./dL
pH, UA: 7 (ref 5.0–8.0)

## 2019-05-04 MED ORDER — ROSUVASTATIN CALCIUM 10 MG PO TABS: 10.0000 mg | ORAL_TABLET | Freq: Every day | ORAL | 3 refills | Status: DC

## 2019-05-04 NOTE — Patient Instructions (Signed)

## 2019-05-04 NOTE — Telephone Encounter (Signed)
Due to PVD her LDL goal is now < 70.  Increase Crestor to 10mg  daily and check FLp and ALT in 6 weeks

## 2019-05-04 NOTE — Telephone Encounter (Signed)
Notes recorded by [name], MD on 05/01/2019 at 5:11 PM EDT  Please find out if patient thinks she could increase Crestor further. LDL goal is < 70

## 2019-05-04 NOTE — Telephone Encounter (Signed)
Will route to team to arrange for home health to come out

## 2019-05-04 NOTE — Telephone Encounter (Signed)
Spoke with pt and went over recommendations. Pt agreeable to plan.  She would like to have the in home service come in to check her labs.  Advised I will send to Dr. Radford Pax for approval for the home health team to come in for labs. Pt verbalized understanding and was appreciative for call.

## 2019-05-04 NOTE — Telephone Encounter (Signed)
I spoke with the patient regarding her lipid/ liver panel.  She states she is doing well on the crestor 5 mg once daily and zetia 10 mg once daily.  I inquired if she felt like she could tolerate increasing the dose of her crestor and she confirmed she could.   She states she does have some joint aches, but she does not feel they are related to Crestor as they have gotten no worse since starting this medication.  She is aware that I will forward to Dr. Radford Pax for further recommendations on increasing crestor and we will call her back.  The patient voices understanding of the above and is agreeable.

## 2019-05-04 NOTE — Telephone Encounter (Signed)
[place] Visit Initial Request  Date of Request ([place]):  May 04, 2019  Requesting Provider:  Fransico Him, MD    Agency Requested:    [facility] Contact:  Glory Buff, NP [address] [place], [place] [address] Phone #:  [phone] Fax #:  [phone]  Patient Demographic Information: Name:  Margaret Kelly Age:  68 y.o.   DOB:  Jul 22, 1951  MRN:  [id number]   Address:   [place] [address]   Phone Numbers:   Home Phone [phone]  Work Phone [phone]  Mobile [phone]     Emergency Contact Information on File:   Contact Information    Name Relation Home Work [place], [place] Son   [place]   [phone]      The above family members may be contacted for information on this patient (review DPR on file):  Yes    Patient Clinical Information:  Primary Care Provider:  Lanae Kelly, [place]  Primary Cardiologist:  Fransico Him, MD  Primary Electrophysiologist:  None   Past Medical Hx: Margaret Kelly  has a past medical history of Anxiety, Blood transfusion without reported diagnosis (1982), GERD (gastroesophageal reflux disease), History of hepatitis C (10/13/2015), Hyperlipidemia LDL goal <100 (10/13/2015), Hypertension, Muscle spasm of left lower extremity (10/13/2015), Nerve damage (2001), Pain of left hip joint (02/21/2018), and Plantar wart.   Allergies: She is allergic to amlodipine.   Medications: Current Outpatient Medications on File Prior to Visit  Medication Sig  . acetaminophen (TYLENOL) 500 MG tablet Take 1,000 mg by mouth every 6 (six) hours as needed (for pain).  Marland Kitchen aspirin EC 81 MG tablet Take 81 mg by mouth daily.  . Calcium Carb-Cholecalciferol (CALCIUM + D3 PO) Take 1 tablet by mouth every evening.  . carvedilol (COREG) 25 MG tablet Take 1 tablet (25 mg total) by mouth 2 (two) times daily.  . cilostazol (PLETAL) 50 MG tablet Take 1 tablet (50 mg  total) by mouth 2 (two) times daily.  . cyclobenzaprine (FLEXERIL) 10 MG tablet Take 1 tablet (10 mg total) by mouth at bedtime.  Marland Kitchen ezetimibe (ZETIA) 10 MG tablet Take 1 tablet (10 mg total) by mouth daily.  . Fish Oil-Cholecalciferol (FISH OIL + D3) 1200-1000 MG-UNIT CAPS Take 1 capsule by mouth daily.  Marland Kitchen losartan (COZAAR) 100 MG tablet TAKE 1 TABLET BY MOUTH DAILY (Patient taking differently: Take 100 mg by mouth daily. )  . MAGNESIUM CITRATE PO Take 480 mg by mouth 2 (two) times a day.  Marland Kitchen MELATONIN PO Take 12 mg by mouth at bedtime.  . Multiple Vitamin (MULTIVITAMIN WITH MINERALS) TABS tablet Take 1 tablet by mouth every evening. Women's One-A-Day  . omeprazole (PRILOSEC) 40 MG capsule Take on cap po qd (Patient taking differently: Take 40 mg by mouth every evening. )  . OVER THE COUNTER MEDICATION Cayenne/Turmeric/Cumin combo capsule by mouth daily   No current facility-administered medications on file prior to visit.      Social Hx: She  reports that she has been smoking cigarettes. She has been smoking about 0.50 packs per day. She has quit using smokeless tobacco. She reports current alcohol use. She reports that she does not use drugs.    Diagnosis/Reason for Visit:   INITIAL VISIT/ LABS  Services Requested:  Vital Signs (BP, Pulse, O2, Weight)  Physical Exam  Medication Reconciliation  Labs:  LIPID, ALT  # of Visits Needed/Frequency per  Week: 1 VISIT TO BE DONE IN 6 WEEKS AS CRESTOR HAS BEEN INCREASED TODAY 05/04/19 TO CRESTOR 10 MG DAILY PER DR. Radford Pax.

## 2019-05-04 NOTE — Telephone Encounter (Signed)
I am fine with that 

## 2019-05-04 NOTE — Progress Notes (Signed)
Patient [facility]   Progress Note: General Provider: Lanae Boast, FNP  SUBJECTIVE:   JOBY SEIFFERT is a 68 y.o. female who  has a past medical history of Anxiety, Blood transfusion without reported diagnosis (1982), GERD (gastroesophageal reflux disease), History of hepatitis C (10/13/2015), Hyperlipidemia LDL goal <100 (10/13/2015), Hypertension, Muscle spasm of left lower extremity (10/13/2015), Nerve damage (2001), Pain of left hip joint (02/21/2018), and Plantar wart.. Patient presents today for Hypertension and Hyperlipidemia   Since the last visit, patient was seen by Dr. Gwenlyn Found in [place]. She  was referred by Richardson Dopp, PA-C for peripheral vascular evaluation and treatment.  She has a history of continued tobacco abuse of 1/2 pack/day which she is smoked for the last 40 years, treated hypertension and hyperlipidemia currently on Crestor.  There is no family history.  She is never had a heart attack or stroke.  She has had right greater than left calf claudication over the last 18 months with recent Dopplers performed 02/25/2019 revealing a right ABI 0.57 and a left of 0.85 with an occluded right popliteal artery. Peripheral angiography on 03/26/2019. Placed on a 3 month trial of pletal due to not being a good surgical candidate.  Patient states that since starting medications, she is feeling much better and is able to walk further without pain.   Review of Systems  Constitutional: Negative.   HENT: Negative.   Eyes: Negative.   Respiratory: Negative.   Cardiovascular: Negative.   Gastrointestinal: Negative.   Genitourinary: Negative.   Musculoskeletal: Negative.   Skin: Negative.   Neurological: Negative.   Psychiatric/Behavioral: Negative.      OBJECTIVE: BP (!) 142/72 (BP Location: Right Arm, Patient Position: Sitting, Cuff Size: Normal) Comment: manually  Pulse 67   Temp 98.7 F (37.1 C) (Oral)   Resp 16   Ht 5\' 6"  (1.676 m)    Wt 158 lb (71.7 kg)   SpO2 98%   BMI 25.50 kg/m   Wt Readings from Last 3 Encounters:  05/04/19 158 lb (71.7 kg)  04/15/19 160 lb 3.2 oz (72.7 kg)  03/26/19 157 lb (71.2 kg)     Physical Exam Vitals signs and nursing note reviewed.  Constitutional:      General: She is not in acute distress.    Appearance: Normal appearance.  HENT:     Head: Normocephalic and atraumatic.  Eyes:     Extraocular Movements: Extraocular movements intact.     Conjunctiva/sclera: Conjunctivae normal.     Pupils: Pupils are equal, round, and reactive to light.  Cardiovascular:     Rate and Rhythm: Normal rate and regular rhythm.     Heart sounds: No murmur.  Pulmonary:     Effort: Pulmonary effort is normal.     Breath sounds: Normal breath sounds.  Musculoskeletal: Normal range of motion.  Skin:    General: Skin is warm and dry.  Neurological:     Mental Status: She is alert and oriented to person, place, and time.  Psychiatric:        Mood and Affect: Mood normal.        Behavior: Behavior normal.        Thought Content: Thought content normal.        Judgment: Judgment normal.     ASSESSMENT/PLAN:   1. Essential hypertension No medication changes warranted at the present time.   - Urinalysis Dipstick  2. Hyperlipidemia LDL goal <100 Patient advised to increase  crestor to 10mg  by mouth daily as instructed by dr. Kennon Holter office.   3. Peripheral arterial disease ([place])     Return in about 3 months (around 08/03/2019).    The patient was given clear instructions to go to ER or return to medical center if symptoms do not improve, worsen or new problems develop. The patient verbalized understanding and agreed with plan of care.   Ms. Doug Sou. Nathaneil Canary, FNP-BC Patient [facility] [address] [place], [place] [address] [phone]

## 2019-05-13 ENCOUNTER — Encounter (HOSPITAL_COMMUNITY): Payer: Self-pay | Admitting: *Deleted

## 2019-05-13 ENCOUNTER — Encounter (HOSPITAL_COMMUNITY): Payer: Self-pay

## 2019-05-14 ENCOUNTER — Ambulatory Visit (INDEPENDENT_AMBULATORY_CARE_PROVIDER_SITE_OTHER): Payer: Medicare Other | Admitting: Family Medicine

## 2019-05-14 ENCOUNTER — Encounter: Payer: Self-pay | Admitting: Family Medicine

## 2019-05-14 ENCOUNTER — Ambulatory Visit (HOSPITAL_COMMUNITY)
Admission: RE | Admit: 2019-05-14 | Discharge: 2019-05-14 | Disposition: A | Discharge status: Home / Self Care | Payer: Medicare Other | Source: Ambulatory Visit | Attending: Family Medicine | Admitting: Family Medicine

## 2019-05-14 ENCOUNTER — Telehealth: Payer: Self-pay

## 2019-05-14 ENCOUNTER — Other Ambulatory Visit: Payer: Self-pay

## 2019-05-14 VITALS — BP 108/58 | HR 83 | Temp 98.6°F | Resp 16 | Ht 66.0 in | Wt 154.0 lb

## 2019-05-14 DIAGNOSIS — R109 Unspecified abdominal pain: Secondary | ICD-10-CM | POA: Diagnosis not present

## 2019-05-14 DIAGNOSIS — R1032 Left lower quadrant pain: Secondary | ICD-10-CM

## 2019-05-14 DIAGNOSIS — R1030 Lower abdominal pain, unspecified: Secondary | ICD-10-CM

## 2019-05-14 LAB — POCT URINALYSIS DIPSTICK
Glucose, UA: NEGATIVE
Ketones, UA: 15
Leukocytes, UA: NEGATIVE
Nitrite, UA: NEGATIVE
Protein, UA: POSITIVE — AB
Spec Grav, UA: 1.025 (ref 1.010–1.025)
Urobilinogen, UA: 1 E.U./dL
pH, UA: 5.5 (ref 5.0–8.0)

## 2019-05-14 LAB — POCT I-STAT CREATININE: Creatinine, Ser: 1.2 mg/dL — ABNORMAL HIGH (ref 0.44–1.00)

## 2019-05-14 MED ORDER — IOHEXOL 300 MG/ML  SOLN
100.0000 mL | Freq: Once | INTRAMUSCULAR | Status: AC | PRN
  Administered 2019-05-14: 17:00:00 100 mL via INTRAVENOUS

## 2019-05-14 NOTE — Telephone Encounter (Signed)
Called and spoke with patient, she states she is having abdominal pain and cramping off and on for 2 days. She denies constipation, vomiting, fever, and nausea. I advised that she should make an appointment to come in for evaluation. Advised if symptoms worsen or she develops vomiting or fever to go to ER ASAP.

## 2019-05-14 NOTE — Progress Notes (Signed)
Patient [facility]   Progress Note: Sick Visit Provider: Lanae Boast, FNP  SUBJECTIVE:   Margaret Kelly is a 68 y.o. female who  has a past medical history of Anxiety, Blood transfusion without reported diagnosis (1982), GERD (gastroesophageal reflux disease), History of hepatitis C (10/13/2015), Hyperlipidemia LDL goal <100 (10/13/2015), Hypertension, Muscle spasm of left lower extremity (10/13/2015), Nerve damage (2001), Pain of left hip joint (02/21/2018), and Plantar wart.. Patient presents today for Abdominal Pain (lower abdominal cramps since tuesday  ) patient presents with lower left abdominal pain x 2 days. She describes the pain as cramping. She states that it is both aggravated and relieved with positional changes. She denies constipation or diarrhea. She reports having an appendectomy in the distant past. Denies fever, chills, or night sweats. No reported history of diverticulitis.  Review of Systems  Constitutional: Negative.   HENT: Negative.   Eyes: Negative.   Respiratory: Negative.   Cardiovascular: Negative.   Gastrointestinal: Positive for abdominal pain and nausea. Negative for blood in stool, constipation, diarrhea, melena and vomiting.  Genitourinary: Negative.   Musculoskeletal: Negative.   Skin: Negative.   Neurological: Negative.   Psychiatric/Behavioral: Negative.      OBJECTIVE: BP (!) 108/58 (BP Location: Right Arm, Patient Position: Sitting, Cuff Size: Normal)   Pulse 83   Temp 98.6 F (37 C) (Oral)   Resp 16   Ht 5\' 6"  (1.676 m)   Wt 154 lb (69.9 kg)   SpO2 97%   BMI 24.86 kg/m   Wt Readings from Last 3 Encounters:  05/14/19 154 lb (69.9 kg)  05/04/19 158 lb (71.7 kg)  04/15/19 160 lb 3.2 oz (72.7 kg)     Physical Exam Vitals signs and nursing note reviewed.  Constitutional:      General: She is not in acute distress.    Appearance: Normal appearance.  HENT:     Head: Normocephalic and atraumatic.   Eyes:     Extraocular Movements: Extraocular movements intact.     Conjunctiva/sclera: Conjunctivae normal.     Pupils: Pupils are equal, round, and reactive to light.  Cardiovascular:     Rate and Rhythm: Normal rate and regular rhythm.     Heart sounds: No murmur.  Pulmonary:     Effort: Pulmonary effort is normal.     Breath sounds: Normal breath sounds.  Abdominal:     General: Bowel sounds are normal.     Tenderness: There is abdominal tenderness in the left lower quadrant.  Musculoskeletal: Normal range of motion.  Skin:    General: Skin is warm and dry.  Neurological:     Mental Status: She is alert and oriented to person, place, and time.  Psychiatric:        Mood and Affect: Mood normal.        Behavior: Behavior normal.        Thought Content: Thought content normal.        Judgment: Judgment normal.     ASSESSMENT/PLAN:   1. Left lower quadrant abdominal pain - Urinalysis Dipstick Results pending. Will treat accordingly.  - CT Abdomen Pelvis W Contrast; Future          The patient was given clear instructions to go to ER or return to medical center if symptoms do not improve, worsen or new problems develop. The patient verbalized understanding and agreed with plan of care.   Ms. Doug Sou. Nathaneil Canary, FNP-BC Patient [facility] [place], [place] [address] [phone]     This note has been created with Dragon speech recognition software and smart phrase technology. Any transcriptional errors are unintentional.

## 2019-05-15 ENCOUNTER — Telehealth: Payer: Self-pay

## 2019-05-15 ENCOUNTER — Other Ambulatory Visit: Payer: Self-pay | Admitting: Family Medicine

## 2019-05-15 DIAGNOSIS — K5792 Diverticulitis of intestine, part unspecified, without perforation or abscess without bleeding: Secondary | ICD-10-CM

## 2019-05-15 MED ORDER — METRONIDAZOLE 500 MG PO TABS: 500.0000 mg | ORAL_TABLET | Freq: Three times a day (TID) | ORAL | 0 refills | Status: AC

## 2019-05-15 MED ORDER — CIPROFLOXACIN HCL 500 MG PO TABS: 500.0000 mg | ORAL_TABLET | Freq: Two times a day (BID) | ORAL | 0 refills | Status: DC

## 2019-05-15 NOTE — Progress Notes (Signed)
Sending cipro 500mg  BID and flagyl 500 TID for 10 days for acute diverticulitis.

## 2019-05-15 NOTE — Telephone Encounter (Signed)
Called and spoke with patient, advised that she does have diverticulitis and that we have sent in 2 antibiotics to the pharmacy for her to take. Patient verbalized understanding. Thanks!

## 2019-05-15 NOTE — Telephone Encounter (Signed)
-----   Message from Lanae Boast, [place] sent at 05/15/2019  8:32 AM EDT ----- Please let patient know that she does have diverticulitis. I will send 2 antibiotics to the pharmacy for her to take.

## 2019-05-15 NOTE — Progress Notes (Signed)
Please let patient know that she does have diverticulitis. I will send 2 antibiotics to the pharmacy for her to take.

## 2019-06-06 ENCOUNTER — Other Ambulatory Visit: Payer: Self-pay | Admitting: Physician Assistant

## 2019-06-07 ENCOUNTER — Other Ambulatory Visit: Payer: Self-pay | Admitting: Family Medicine

## 2019-06-07 DIAGNOSIS — M62838 Other muscle spasm: Secondary | ICD-10-CM

## 2019-06-07 DIAGNOSIS — I10 Essential (primary) hypertension: Secondary | ICD-10-CM

## 2019-06-08 ENCOUNTER — Encounter: Payer: Self-pay | Admitting: Cardiology

## 2019-06-08 DIAGNOSIS — E785 Hyperlipidemia, unspecified: Secondary | ICD-10-CM | POA: Diagnosis not present

## 2019-06-08 MED ORDER — ROSUVASTATIN CALCIUM 10 MG PO TABS: 10.0000 mg | ORAL_TABLET | Freq: Every day | ORAL | 2 refills | Status: DC

## 2019-06-09 ENCOUNTER — Telehealth: Payer: Self-pay

## 2019-06-09 LAB — LIPID PANEL
Chol/HDL Ratio: 2.4 ratio (ref 0.0–4.4)
Cholesterol, Total: 153 mg/dL (ref 100–199)
HDL: 64 mg/dL (ref >39)
LDL Chol Calc (NIH): 73 mg/dL (ref 0–99)
Triglycerides: 87 mg/dL (ref 0–149)
VLDL Cholesterol Cal: 16 mg/dL (ref 5–40)

## 2019-06-09 LAB — ALT: ALT: 14 IU/L (ref 0–32)

## 2019-06-09 NOTE — Progress Notes (Signed)
Margaret Kelly      DOB: 1951/03/30  Purpose of Visit: Assessment, vs, wt, Fasting lipids, ALT, med rec   Medications: Is the patient taking all medications listed on MAR from Epic? Yes List any medications that are not being taken correctly:    List any medication refills needed:  Is the patient able to pick up medications? Yes  Vitals: BP:  168/98     HR:  72    Oxygen:  98% Weight:  154.3lb        Physical Exam:  Lung sounds: clear  Heart sounds: regular  Peripheral edema:no  Wounds: no  Location:  Any patient concerns? Only c/o knees that have been evaluated by Dr. Alvester Chou and have fat/calcium deposits in them.  She is able to walk and do her ADL's w/o difficulty.  She stated she feels good overall and has lost 15 lbs  ReDS Vest/Clip Reading:n/a  Rhythm Strip:n/a  Is Home Health recommended? No If yes, state reason:   Vanita Ingles, RN 06/09/19

## 2019-06-09 NOTE — Telephone Encounter (Signed)
-----   Message from [name], MD sent at 06/09/2019  9:04 AM EDT ----- Stable labs - continue current meds and forward to PCP

## 2019-06-09 NOTE — Telephone Encounter (Signed)
Notes recorded by Frederik Schmidt, RN on 06/09/2019 at 9:33 AM EDT  Lpm with results. 10/6  ------

## 2019-06-18 ENCOUNTER — Emergency Department (HOSPITAL_COMMUNITY)
Admission: EM | Admit: 2019-06-18 | Discharge: 2019-06-19 | Disposition: A | Discharge status: Home / Self Care | Payer: Medicare Other | Attending: Emergency Medicine | Admitting: Emergency Medicine

## 2019-06-18 DIAGNOSIS — R911 Solitary pulmonary nodule: Secondary | ICD-10-CM | POA: Diagnosis not present

## 2019-06-18 DIAGNOSIS — R1030 Lower abdominal pain, unspecified: Secondary | ICD-10-CM | POA: Insufficient documentation

## 2019-06-18 DIAGNOSIS — Z79899 Other long term (current) drug therapy: Secondary | ICD-10-CM | POA: Insufficient documentation

## 2019-06-18 DIAGNOSIS — R109 Unspecified abdominal pain: Secondary | ICD-10-CM | POA: Diagnosis present

## 2019-06-18 DIAGNOSIS — I10 Essential (primary) hypertension: Secondary | ICD-10-CM | POA: Insufficient documentation

## 2019-06-18 DIAGNOSIS — F17210 Nicotine dependence, cigarettes, uncomplicated: Secondary | ICD-10-CM | POA: Diagnosis not present

## 2019-06-18 DIAGNOSIS — R3129 Other microscopic hematuria: Secondary | ICD-10-CM | POA: Diagnosis not present

## 2019-06-18 DIAGNOSIS — Z7982 Long term (current) use of aspirin: Secondary | ICD-10-CM | POA: Diagnosis not present

## 2019-06-18 DIAGNOSIS — R1032 Left lower quadrant pain: Secondary | ICD-10-CM | POA: Diagnosis not present

## 2019-06-18 LAB — CBC
HCT: 45.9 % (ref 36.0–46.0)
Hemoglobin: 15.9 g/dL — ABNORMAL HIGH (ref 12.0–15.0)
MCH: 32.9 pg (ref 26.0–34.0)
MCHC: 34.6 g/dL (ref 30.0–36.0)
MCV: 95 fL (ref 80.0–100.0)
Platelets: 327 10*3/uL (ref 150–400)
RBC: 4.83 MIL/uL (ref 3.87–5.11)
RDW: 13.1 % (ref 11.5–15.5)
WBC: 10.3 10*3/uL (ref 4.0–10.5)
nRBC: 0 % (ref 0.0–0.2)

## 2019-06-18 LAB — COMPREHENSIVE METABOLIC PANEL
ALT: 20 U/L (ref 0–44)
AST: 21 U/L (ref 15–41)
Albumin: 4.3 g/dL (ref 3.5–5.0)
Alkaline Phosphatase: 64 U/L (ref 38–126)
Anion gap: 13 (ref 5–15)
BUN: 6 mg/dL — ABNORMAL LOW (ref 8–23)
CO2: 21 mmol/L — ABNORMAL LOW (ref 22–32)
Calcium: 9.9 mg/dL (ref 8.9–10.3)
Chloride: 105 mmol/L (ref 98–111)
Creatinine, Ser: 0.77 mg/dL (ref 0.44–1.00)
GFR calc Af Amer: >60 mL/min (ref >60)
GFR calc non Af Amer: >60 mL/min (ref >60)
Glucose, Bld: 139 mg/dL — ABNORMAL HIGH (ref 70–99)
Potassium: 4.9 mmol/L (ref 3.5–5.1)
Sodium: 139 mmol/L (ref 135–145)
Total Bilirubin: 0.6 mg/dL (ref 0.3–1.2)
Total Protein: 7.8 g/dL (ref 6.5–8.1)

## 2019-06-18 LAB — URINALYSIS, ROUTINE W REFLEX MICROSCOPIC
Bilirubin Urine: NEGATIVE
Glucose, UA: NEGATIVE mg/dL
Ketones, ur: 5 mg/dL — AB
Leukocytes,Ua: NEGATIVE
Nitrite: NEGATIVE
Protein, ur: NEGATIVE mg/dL
Specific Gravity, Urine: 1.008 (ref 1.005–1.030)
pH: 7 (ref 5.0–8.0)

## 2019-06-18 LAB — LIPASE, BLOOD: Lipase: 26 U/L (ref 11–51)

## 2019-06-18 MED ORDER — SODIUM CHLORIDE 0.9% FLUSH: 3.0000 mL | Freq: Once | INTRAVENOUS | Status: DC

## 2019-06-18 NOTE — Telephone Encounter (Signed)
I am no longer her PCP. Please send to patient care center provider.

## 2019-06-18 NOTE — ED Notes (Signed)
Updated pt on wait for treatment room.

## 2019-06-18 NOTE — ED Triage Notes (Signed)
Pt arrives to ED with reoccurrence of LLQ pain- pt states she had diverticulitis 1 month ago and was treated. This pain came back yesterday afternoon and then woke her up at 0200 today.   Pt states she feels like her bowels are stopped up.

## 2019-06-19 ENCOUNTER — Emergency Department (HOSPITAL_COMMUNITY): Payer: Medicare Other

## 2019-06-19 DIAGNOSIS — R1030 Lower abdominal pain, unspecified: Secondary | ICD-10-CM | POA: Diagnosis not present

## 2019-06-19 DIAGNOSIS — R1032 Left lower quadrant pain: Secondary | ICD-10-CM | POA: Diagnosis not present

## 2019-06-19 DIAGNOSIS — R911 Solitary pulmonary nodule: Secondary | ICD-10-CM | POA: Diagnosis not present

## 2019-06-19 MED ORDER — SODIUM CHLORIDE 0.9 % IV BOLUS
1000.0000 mL | Freq: Once | INTRAVENOUS | Status: AC
  Administered 2019-06-19: 1000 mL via INTRAVENOUS

## 2019-06-19 MED ORDER — IOHEXOL 350 MG/ML SOLN
100.0000 mL | Freq: Once | INTRAVENOUS | Status: AC | PRN
  Administered 2019-06-19: 100 mL via INTRAVENOUS

## 2019-06-19 MED ORDER — IBUPROFEN 600 MG PO TABS: 600.0000 mg | ORAL_TABLET | Freq: Four times a day (QID) | ORAL | 0 refills | Status: DC | PRN

## 2019-06-19 MED ORDER — HYDROMORPHONE HCL 1 MG/ML IJ SOLN
0.5000 mg | Freq: Once | INTRAMUSCULAR | Status: AC
  Administered 2019-06-19: 02:00:00 0.5 mg via INTRAVENOUS
  Filled 2019-06-19: qty 1

## 2019-06-19 MED ORDER — KETOROLAC TROMETHAMINE 15 MG/ML IJ SOLN
15.0000 mg | Freq: Once | INTRAMUSCULAR | Status: AC
  Administered 2019-06-19: 03:00:00 15 mg via INTRAVENOUS
  Filled 2019-06-19: qty 1

## 2019-06-19 NOTE — ED Notes (Signed)
Currently complaining of abdominal pain rated 10/10.

## 2019-06-19 NOTE — ED Provider Notes (Signed)
[facility] EMERGENCY DEPARTMENT Provider Note  CSN: [id number] Arrival date & time: 06/18/19 1820  Chief Complaint(s) Abdominal Pain  HPI Margaret Kelly is a 68 y.o. female with a past medical history listed below including peripheral vascular disease and recent diverticulitis who presents to the emergency department with sudden onset right lower quadrant abdominal pain described as a deep burning stabbing sensation.  She reports that she initially felt the pain 2 days ago.  This was self-limiting however approximately 23 hours ago the pain returned and has worsened throughout the day.  There is no alleviating or aggravating factors.  Patient denies any traumas.  No nausea or vomiting.  Patient reports history of diarrhea due to medication but reports that she has not had a bowel movement today or been able to pass gas.  She denies any fevers or chills.  No chest pain or shortness of breath.  HPI  Past Medical History Past Medical History:  Diagnosis Date   Anxiety    Blood transfusion without reported diagnosis 1982   GERD (gastroesophageal reflux disease)    History of hepatitis C 10/13/2015   Hyperlipidemia LDL goal <100 10/13/2015   Hypertension    Muscle spasm of left lower extremity 10/13/2015   Nerve damage 2001   left side as result of fall   Pain of left hip joint 02/21/2018   Plantar wart    Patient Active Problem List   Diagnosis Date Noted   Peripheral arterial disease ([place]) 03/20/2019   Tobacco abuse counseling 11/12/2018   Pain of left hip joint 02/21/2018   Essential hypertension 10/13/2015   Hyperlipidemia LDL goal <100 10/13/2015   Muscle spasm of left lower extremity 10/13/2015   History of hepatitis C 10/13/2015   Home Medication(s) Prior to Admission medications   Medication Sig Start Date End Date Taking? Authorizing Provider  acetaminophen (TYLENOL) 500 MG tablet Take 1,000 mg by mouth every 6 (six) hours as needed (for  pain).   Yes [provider]  aspirin EC 81 MG tablet Take 81 mg by mouth daily.   Yes [provider]  Calcium Carb-Cholecalciferol (CALCIUM + D3 PO) Take 1 tablet by mouth every evening.   Yes [provider]  carvedilol (COREG) 25 MG tablet Take 1 tablet (25 mg total) by mouth 2 (two) times daily. 11/12/18 11/12/19 Yes Turner, Eber Hong, MD  cilostazol (PLETAL) 50 MG tablet Take 1 tablet (50 mg total) by mouth 2 (two) times daily. 04/15/19  Yes Lorretta Harp, MD  cyclobenzaprine (FLEXERIL) 10 MG tablet TAKE 1 TABLET BY MOUTH EVERY NIGHT AT BEDTIME AS NEEDED Patient taking differently: Take 10 mg by mouth at bedtime as needed for muscle spasms.  06/08/19  Yes Tresa Garter, MD  ezetimibe (ZETIA) 10 MG tablet TAKE 1 TABLET(10 MG) BY MOUTH DAILY Patient taking differently: Take 10 mg by mouth daily.  06/08/19  Yes Weaver, Scott T, PA-C  Fish Oil-Cholecalciferol (FISH OIL + D3) 1200-1000 MG-UNIT CAPS Take 1 capsule by mouth daily.   Yes [provider]  losartan (COZAAR) 100 MG tablet TAKE 1 TABLET BY MOUTH DAILY Patient taking differently: Take 100 mg by mouth daily.  06/08/19  Yes Jegede, Marlena Clipper, MD  MAGNESIUM CITRATE PO Take 480 mg by mouth 2 (two) times a day.   Yes [provider]  Melatonin 12 MG TABS Take 12 mg by mouth at bedtime.   Yes [provider]  Multiple Vitamin (MULTIVITAMIN WITH MINERALS) TABS tablet  Take 1 tablet by mouth every evening. Women's One-A-Day   Yes [provider]  omeprazole (PRILOSEC) 40 MG capsule Take on cap po qd Patient taking differently: Take 40 mg by mouth every evening.  01/30/19  Yes Lanae Boast, FNP  rosuvastatin (CRESTOR) 10 MG tablet Take 1 tablet (10 mg total) by mouth daily. 06/08/19  Yes Weaver, Scott T, PA-C  ciprofloxacin (CIPRO) 500 MG tablet Take 1 tablet (500 mg total) by mouth 2 (two) times daily. Patient not taking: Reported on 06/19/2019 05/15/19   Lanae Boast, FNP   ibuprofen (ADVIL) 600 MG tablet Take 1 tablet (600 mg total) by mouth every 6 (six) hours as needed. 06/19/19   Fatima Blank, MD                                                                                                                                    Past Surgical History Past Surgical History:  Procedure Laterality Date   ABDOMINAL AORTOGRAM W/LOWER EXTREMITY Bilateral 03/26/2019   Procedure: ABDOMINAL AORTOGRAM W/LOWER EXTREMITY;  Surgeon: Lorretta Harp, MD;  Location: [facility];  Service: Cardiovascular;  Laterality: Bilateral;   ABDOMINAL HYSTERECTOMY  1982   CARPAL TUNNEL RELEASE Left 2005   foot spur Left 2007   foot   Family History Family History  Problem Relation Age of Onset   Colon cancer Neg Hx    Esophageal cancer Neg Hx    Rectal cancer Neg Hx    Stomach cancer Neg Hx     Social History Social History   Tobacco Use   Smoking status: Current Every Day Smoker    Packs/day: 0.50    Types: Cigarettes   Smokeless tobacco: Former Systems developer   Tobacco comment: about half a pack per day  Substance Use Topics   Alcohol use: Yes    Comment: rare   Drug use: No   Allergies Amlodipine  Review of Systems Review of Systems All other systems are reviewed and are negative for acute change except as noted in the HPI  Physical Exam Vital Signs  I have reviewed the triage vital signs BP (!) 207/109 (BP Location: Right Arm)    Pulse 76    Temp 98.1 F (36.7 C) (Oral)    Resp (!) 22    SpO2 97%   Physical Exam Vitals signs reviewed.  Constitutional:      General: She is not in acute distress.    Appearance: She is well-developed. She is not diaphoretic.     Comments: In obvious discomfort, writhing in bed.  HENT:     Head: Normocephalic and atraumatic.     Right Ear: External ear normal.     Left Ear: External ear normal.     Nose: Nose normal.  Eyes:     General: No scleral icterus.    Conjunctiva/sclera: Conjunctivae  normal.  Neck:     Musculoskeletal: Normal range  of motion.     Trachea: Phonation normal.  Cardiovascular:     Rate and Rhythm: Normal rate and regular rhythm.  Pulmonary:     Effort: Pulmonary effort is normal. No respiratory distress.     Breath sounds: No stridor.  Abdominal:     General: There is no distension.     Tenderness: There is abdominal tenderness (mild discomfort) in the right lower quadrant. There is no guarding or rebound. Negative signs include Murphy's sign, Rovsing's sign and McBurney's sign.  Musculoskeletal: Normal range of motion.  Neurological:     Mental Status: She is alert and oriented to person, place, and time.  Psychiatric:        Behavior: Behavior normal.     ED Results and Treatments Labs (all labs ordered are listed, but only abnormal results are displayed) Labs Reviewed  COMPREHENSIVE METABOLIC PANEL - Abnormal; Notable for the following components:      Result Value   CO2 21 (*)    Glucose, Bld 139 (*)    BUN 6 (*)    All other components within normal limits  CBC - Abnormal; Notable for the following components:   Hemoglobin 15.9 (*)    All other components within normal limits  URINALYSIS, ROUTINE W REFLEX MICROSCOPIC - Abnormal; Notable for the following components:   Color, Urine STRAW (*)    Hgb urine dipstick SMALL (*)    Ketones, ur 5 (*)    Bacteria, UA RARE (*)    All other components within normal limits  LIPASE, BLOOD                                                                                                                         EKG  EKG Interpretation  Date/Time:    Ventricular Rate:    PR Interval:    QRS Duration:   QT Interval:    QTC Calculation:   R Axis:     Text Interpretation:        Radiology Ct Angio Chest/abd/pel For Dissection W And/or Wo Contrast  Result Date: 06/19/2019 CLINICAL DATA:  Left lower quadrant pain EXAM: CT ANGIOGRAPHY CHEST, ABDOMEN AND PELVIS TECHNIQUE: Multidetector CT  imaging through the chest, abdomen and pelvis was performed using the standard protocol during bolus administration of intravenous contrast. Multiplanar reconstructed images and MIPs were obtained and reviewed to evaluate the vascular anatomy. CONTRAST:  161mL OMNIPAQUE IOHEXOL 350 MG/ML SOLN COMPARISON:  05/14/2019 CT FINDINGS: CTA CHEST FINDINGS Cardiovascular: Non contrasted images of the chest demonstrate no acute intramural hematoma. Moderate aortic atherosclerosis. No aneurysm. No dissection is seen. Coronary vascular calcification. Normal heart size. No pericardial effusion Mediastinum/Nodes: 1.4 cm nodule inferior pole of the right lobe of thyroid. Midline trachea. No significant adenopathy. Esophagus within normal limits. Lungs/Pleura: Lungs are clear. No pleural effusion or pneumothorax. Musculoskeletal: No acute or suspicious osseous abnormality. Review of the MIP images confirms the above findings. CTA ABDOMEN AND PELVIS FINDINGS VASCULAR Aorta: No aneurysm  or dissection. Moderate aortic atherosclerosis. No acute occlusive disease. Celiac: Calcified at the origin. Suspect moderate stenosis at the origin with mild poststenotic dilatation. Distal vascular patency SMA: Mild calcification at the origin but no significant stenosis Renals: Single right and single left renal arteries are patent. Left renal artery bifurcates shortly after the origin. IMA: Patent without evidence of aneurysm, dissection, vasculitis or significant stenosis. Inflow: Moderate aortic atherosclerosis and mild mural thickening of the iliac vessels. Mild disease of the left internal iliac artery. External iliac arteries are widely patent. Mild disease at the common femoral arteries but no stenosis. Review of the MIP images confirms the above findings. NON-VASCULAR Hepatobiliary: No focal liver abnormality is seen. No gallstones, gallbladder wall thickening, or biliary dilatation. Pancreas: Unremarkable. No pancreatic ductal dilatation  or surrounding inflammatory changes. Spleen: Normal in size without focal abnormality. Adrenals/Urinary Tract: Nodularity of the adrenal glands. 14 mm left adrenal nodule for which follow-up was previously recommended. Kidneys show no hydronephrosis. The bladder is unremarkable Stomach/Bowel: Stomach is decompressed. There is borderline gastric wall thickening. No dilated small bowel. Cecum is in the pelvis. The appendix is not well seen. Diverticular disease of the colon. Mild edema and inflammatory change at the distal descending/proximal sigmoid colon with minimal wall thickening. Lymphatic: No significant adenopathy. Reproductive: Status post hysterectomy. No adnexal masses. Other: Negative for free air or free fluid Musculoskeletal: No acute or suspicious osseous abnormality Review of the MIP images confirms the above findings. IMPRESSION: 1. Negative for acute aortic dissection or aneurysm. Aortic atherosclerosis without acute occlusive disease. 2. Mild focal inflammatory change in the left lower quadrant near the distal descending/proximal sigmoid colon with mild colon wall thickening. Findings could be secondary to acute diverticulitis. Epiploic appendagitis could also be considered. No perforation or abscess. Electronically Signed   By: Donavan Foil M.D.   On: 06/19/2019 02:22    Pertinent labs & imaging results that were available during my care of the patient were reviewed by me and considered in my medical decision making (see chart for details).  Medications Ordered in ED Medications  sodium chloride flush (NS) 0.9 % injection 3 mL (has no administration in time range)  sodium chloride 0.9 % bolus 1,000 mL (0 mLs Intravenous Stopped 06/19/19 0418)  HYDROmorphone (DILAUDID) injection 0.5 mg (0.5 mg Intravenous Given 06/19/19 0130)  iohexol (OMNIPAQUE) 350 MG/ML injection 100 mL (100 mLs Intravenous Contrast Given 06/19/19 0138)  ketorolac (TORADOL) 15 MG/ML injection 15 mg (15 mg Intravenous  Given 06/19/19 0300)                                                                                                                                    Procedures Procedures  (including critical care time)  Medical Decision Making / ED Course I have reviewed the nursing notes for this encounter and the patient's prior records (if available in EHR or on provided paperwork).   [name]  was evaluated in Emergency Department on 06/19/2019 for the symptoms described in the history of present illness. She was evaluated in the context of the global COVID-19 pandemic, which necessitated consideration that the patient might be at risk for infection with the SARS-CoV-2 virus that causes COVID-19. Institutional protocols and algorithms that pertain to the evaluation of patients at risk for COVID-19 are in a state of rapid change based on information released by regulatory bodies including the CDC and federal and state organizations. These policies and algorithms were followed during the patient's care in the ED.    Clinical Course as of Jun 19 419  Fri Jun 19, 2019  0133 Patient presents with right lower quadrant abdominal pain.  Exam not concerning but patient appears to be in significant pain.  Given report of no bowel movement or passing gas for the past 24 hours, will need to rule out SBO.  On review of records, patient does not have a history of renal stones noted on most recent CT scan.  Labs drawn in triage are reassuring without leukocytosis, significant electrolyte derangement or renal sufficiency.  UA not suspicious for infection.  Patient is hypertensive.  Would like to rule out descending aortic dissection.  Patient will be provided with pain medicine, IV fluids.   CT angio will be able to assess evidence of dissection, occlusion or SBO.   [PC]  [id number] CT negative for dissection, occlusion or small bowel obstruction.  It appears to be either acute diverticulitis versus epiploic  appendagitis.  However patient's pain was right-sided and not left.  These findings do not correlate with patient's presentation.   [PC]  [id number] Patient's pain completely resolved with Toradol.  Considering possible small renal stone given the hemoglobin and the urine.   [PC]    Clinical Course User Index [PC] Malya Cirillo, Grayce Sessions, MD    The patient appears reasonably screened and/or stabilized for discharge and I doubt any other medical condition or other [facility] requiring further screening, evaluation, or treatment in the ED at this time prior to discharge.  The patient is safe for discharge with strict return precautions.    Final Clinical Impression(s) / ED Diagnoses Final diagnoses:  Lower abdominal pain  Other microscopic hematuria     The patient appears reasonably screened and/or stabilized for discharge and I doubt any other medical condition or other [facility] requiring further screening, evaluation, or treatment in the ED at this time prior to discharge.  Disposition: Discharge  Condition: Good  I have discussed the results, Dx and Tx plan with the patient who expressed understanding and agree(s) with the plan. Discharge instructions discussed at great length. The patient was given strict return precautions who verbalized understanding of the instructions. No further questions at time of discharge.    ED Discharge Orders         Ordered    ibuprofen (ADVIL) 600 MG tablet  Every 6 hours PRN     06/19/19 0419            Follow Up: Lanae Boast, FNP [place] [address] [phone]  Schedule an appointment as soon as possible for a visit  If symptoms do not improve or  worsen     This chart was dictated using voice recognition software.  Despite best efforts to proofread,  errors can occur which can change the documentation meaning.   Fatima Blank, MD 06/19/19 [phone]

## 2019-06-19 NOTE — Discharge Instructions (Signed)

## 2019-07-15 ENCOUNTER — Other Ambulatory Visit: Payer: Self-pay

## 2019-07-15 ENCOUNTER — Other Ambulatory Visit: Payer: Self-pay | Admitting: Family Medicine

## 2019-07-15 DIAGNOSIS — K219 Gastro-esophageal reflux disease without esophagitis: Secondary | ICD-10-CM

## 2019-07-15 DIAGNOSIS — M62838 Other muscle spasm: Secondary | ICD-10-CM

## 2019-07-15 MED ORDER — CYCLOBENZAPRINE HCL 10 MG PO TABS: 10.0000 mg | ORAL_TABLET | Freq: Every evening | ORAL | 0 refills | Status: DC | PRN

## 2019-07-17 ENCOUNTER — Ambulatory Visit (INDEPENDENT_AMBULATORY_CARE_PROVIDER_SITE_OTHER): Payer: Medicare Other | Admitting: Cardiovascular Disease

## 2019-07-17 ENCOUNTER — Encounter: Payer: Self-pay | Admitting: Cardiovascular Disease

## 2019-07-17 ENCOUNTER — Other Ambulatory Visit: Payer: Self-pay

## 2019-07-17 VITALS — BP 158/96 | HR 77 | Ht 66.0 in | Wt 152.6 lb

## 2019-07-17 DIAGNOSIS — M79605 Pain in left leg: Secondary | ICD-10-CM | POA: Diagnosis not present

## 2019-07-17 DIAGNOSIS — M79604 Pain in right leg: Secondary | ICD-10-CM | POA: Diagnosis not present

## 2019-07-17 DIAGNOSIS — I739 Peripheral vascular disease, unspecified: Secondary | ICD-10-CM | POA: Diagnosis not present

## 2019-07-17 LAB — BASIC METABOLIC PANEL
BUN/Creatinine Ratio: 20 (ref 12–28)
BUN: 15 mg/dL (ref 8–27)
CO2: 22 mmol/L (ref 20–29)
Calcium: 10.3 mg/dL (ref 8.7–10.3)
Chloride: 103 mmol/L (ref 96–106)
Creatinine, Ser: 0.74 mg/dL (ref 0.57–1.00)
GFR calc Af Amer: 98 mL/min/{1.73_m2} (ref >59)
GFR calc non Af Amer: 85 mL/min/{1.73_m2} (ref >59)
Glucose: 118 mg/dL — ABNORMAL HIGH (ref 65–99)
Potassium: 5.6 mmol/L — ABNORMAL HIGH (ref 3.5–5.2)
Sodium: 141 mmol/L (ref 134–144)

## 2019-07-17 LAB — CBC
Hematocrit: 45.2 % (ref 34.0–46.6)
Hemoglobin: 15.6 g/dL (ref 11.1–15.9)
MCH: 32 pg (ref 26.6–33.0)
MCHC: 34.5 g/dL (ref 31.5–35.7)
MCV: 93 fL (ref 79–97)
Platelets: 316 10*3/uL (ref 150–450)
RBC: 4.87 x10E6/uL (ref 3.77–5.28)
RDW: 12.8 % (ref 11.7–15.4)
WBC: 7.8 10*3/uL (ref 3.4–10.8)

## 2019-07-17 MED ORDER — SODIUM CHLORIDE 0.9% FLUSH: 3.0000 mL | Freq: Two times a day (BID) | INTRAVENOUS | Status: DC

## 2019-07-17 NOTE — Patient Instructions (Addendum)
Medication Instructions:  Your physician recommends that you continue on your current medications as directed. Please refer to the Current Medication list given to you today.  If you need a refill on your cardiac medications before your next appointment, please call your pharmacy.   Lab work: BMET, CBC If you have labs (blood work) drawn today and your tests are completely normal, you will receive your results only by: [place] (if you have MyChart) OR A paper copy in the mail If you have any lab test that is abnormal or we need to change your treatment, we will call you to review the results.  Testing/Procedures: Peripheral Vascular Angiogram  Your physician has requested that you have a lower extremity arterial exercise duplex with ABI's one week after procedure. During this test, exercise and ultrasound are used to evaluate arterial blood flow in the legs. Allow one hour for this exam. There are no restrictions or special instructions.   Follow-Up: At [facility], you and your health needs are our priority.  As part of our continuing mission to provide you with exceptional heart care, we have created designated Provider Care Teams.  These Care Teams include your primary Cardiologist (physician) and Advanced Practice Providers (APPs -  Physician Assistants and Nurse Practitioners) who all work together to provide you with the care you need, when you need it. You may see Dr Margaret Kelly or one of the following Advanced Practice Providers on your designated Care Team:    Margaret Ransom, PA-C  Margaret Kelly, [place]  Margaret Kelly, [place]  Your physician wants you to follow-up 2 weeks after procedure.  Any Other Special Instructions Will Be Listed Below (If Applicable).     [place] [address] Dept: [phone] Loc: [phone]  Margaret Kelly  07/17/2019  You  are scheduled for a Peripheral Angiogram on Monday, November 30 with Dr. Quay Kelly.  1. Please arrive at the [facility] (Main Entrance A) at [facility]: [address] [place] [address] at 11:30 AM (This time is two hours before your procedure to ensure your preparation). Free valet parking service is available.   Special note: Every effort is made to have your procedure done on time. Please understand that emergencies sometimes delay scheduled procedures.  2. Diet: Do not eat solid foods after midnight.  The patient may have clear liquids until 5am upon the day of the procedure.  3. Labs: You will need to have blood drawn today.  4. Medication instructions in preparation for your procedure:  Take Aspirin daily.   On the morning of your procedure, take your Aspirin and any morning medicines NOT listed above.  You may use sips of water.  5. Plan for one night stay--bring personal belongings. 6. Bring a current list of your medications and current insurance cards. 7. You MUST have a responsible person to drive you home. 8. Someone MUST be with you the first 24 hours after you arrive home or your discharge will be delayed. 9. Please wear clothes that are easy to get on and off and wear slip-on shoes.  You will have to be tested for Covid prior to your procedure. Your appointment is Friday November, 27th @ 10:55am. This is located at the [facility]). [place], [place] [address].  Thank you for allowing [place] to care for you!   -- [facility]\

## 2019-07-17 NOTE — Assessment & Plan Note (Signed)
Ms. Margaret Kelly returns for follow-up.  I performed angiography on her 03/26/2019 revealing an occluded right popliteal artery extending into the origin of the all 3 tibials not percutaneously revascularizable.  She has a 99% left popliteal artery stenosis in the P2 segment.  She is more symptomatic on the right although she does have light lifestyle and claudication on the left.  I did begin her on Pletal 3 months ago which resulted in mild improvement.  She wishes to proceed with left popliteal intervention.

## 2019-07-17 NOTE — H&P (View-Only) (Signed)
07/17/2019 Margaret Kelly   [birth year]  [place]:[id number]  Primary Physician Lanae Boast, [place] Primary Cardiologist: Lorretta Harp MD [name], [place]  HPI:  Margaret Kelly is a 68 y.o.  mildly overweight divorced Caucasian female mother of 2 children, grandmother and one grandchild who is happily retired from Engineer, building services. She is was referred by Richardson Dopp, PA-C for peripheral vascular evaluation and treatment.   I last saw her in the office 04/15/2019.  She has a history of continued tobacco abuse of 1/2 pack/day which she is smoked for the last 40 years, treated hypertension and hyperlipidemia currently on Crestor. There is no family history. She is never had a heart attack or stroke. She has had right greater than left calf claudication over the last 18 months with recent Dopplers performed 02/25/2019 revealing a right ABI 0.57 and a left of 0.85 with an occluded right popliteal artery.  I performed peripheral angiography on her 03/26/2019 revealing an occluded right popliteal artery beginning above the knee and extending down into all 3 proximal tibial arteries.  She did have 90% mid right SFA stenosis as well as a 95% calcified left popliteal artery stenosis.  I do not think that she had endovascular options on the right, the site that she is most symptomatic from.  She is trying to stop smoking.  We decided to try Pletal for 55-month trial.  I do not think she is a surgical candidate for treatment of claudication.  Since I saw her 3 months ago I began her on Pletal 50 mg p.o. twice daily which is resulted in mild improvement in her claudication symptoms.  She still has right greater than left lower extremity claudication.  She wishes to proceed with left popliteal endovascular therapy.  Current Meds  Medication Sig  . acetaminophen (TYLENOL) 500 MG tablet Take 1,000 mg by mouth every 6 (six) hours as needed (for pain).  Marland Kitchen aspirin EC 81 MG tablet Take  81 mg by mouth daily.  . carvedilol (COREG) 25 MG tablet Take 1 tablet (25 mg total) by mouth 2 (two) times daily.  . cilostazol (PLETAL) 50 MG tablet Take 1 tablet (50 mg total) by mouth 2 (two) times daily.  . ciprofloxacin (CIPRO) 500 MG tablet Take 1 tablet (500 mg total) by mouth 2 (two) times daily.  . cyclobenzaprine (FLEXERIL) 10 MG tablet Take 1 tablet (10 mg total) by mouth at bedtime as needed.  . ezetimibe (ZETIA) 10 MG tablet TAKE 1 TABLET(10 MG) BY MOUTH DAILY (Patient taking differently: Take 10 mg by mouth daily. )  . Fish Oil-Cholecalciferol (FISH OIL + D3) 1200-1000 MG-UNIT CAPS Take 1 capsule by mouth daily.  Marland Kitchen ibuprofen (ADVIL) 600 MG tablet Take 1 tablet (600 mg total) by mouth every 6 (six) hours as needed.  Marland Kitchen losartan (COZAAR) 100 MG tablet TAKE 1 TABLET BY MOUTH DAILY (Patient taking differently: Take 100 mg by mouth daily. )  . MAGNESIUM CITRATE PO Take 480 mg by mouth 2 (two) times a day.  . Melatonin 12 MG TABS Take 12 mg by mouth at bedtime.  . Multiple Vitamin (MULTIVITAMIN WITH MINERALS) TABS tablet Take 1 tablet by mouth every evening. Women's One-A-Day  . omeprazole (PRILOSEC) 40 MG capsule TAKE ONE CAPSULE BY MOUTH DAILY  . rosuvastatin (CRESTOR) 10 MG tablet Take 1 tablet (10 mg total) by mouth daily.     Allergies  Allergen Reactions  . Amlodipine Other (See Comments)  Ringing in ears    Social History   Socioeconomic History  . Marital status: Divorced    Spouse name: Not on file  . Number of children: Not on file  . Years of education: Not on file  . Highest education level: Not on file  Occupational History  . Not on file  Social Needs  . Financial resource strain: Not on file  . Food insecurity    Worry: Not on file    Inability: Not on file  . Transportation needs    Medical: Not on file    Non-medical: Not on file  Tobacco Use  . Smoking status: Current Every Day Smoker    Packs/day: 0.50    Types: Cigarettes  . Smokeless tobacco:  Former Systems developer  . Tobacco comment: about half a pack per day  Substance and Sexual Activity  . Alcohol use: Yes    Comment: rare  . Drug use: No  . Sexual activity: Not on file  Lifestyle  . Physical activity    Days per week: Not on file    Minutes per session: Not on file  . Stress: Not on file  Relationships  . Social Herbalist on phone: Not on file    Gets together: Not on file    Attends religious service: Not on file    Active member of club or organization: Not on file    Attends meetings of clubs or organizations: Not on file    Relationship status: Not on file  . Intimate partner violence    Fear of current or ex partner: Not on file    Emotionally abused: Not on file    Physically abused: Not on file    Forced sexual activity: Not on file  Other Topics Concern  . Not on file  Social History Narrative  . Not on file     Review of Systems: General: negative for chills, fever, night sweats or weight changes.  Cardiovascular: negative for chest pain, dyspnea on exertion, edema, orthopnea, palpitations, paroxysmal nocturnal dyspnea or shortness of breath Dermatological: negative for rash Respiratory: negative for cough or wheezing Urologic: negative for hematuria Abdominal: negative for nausea, vomiting, diarrhea, bright red blood per rectum, melena, or hematemesis Neurologic: negative for visual changes, syncope, or dizziness All other systems reviewed and are otherwise negative except as noted above.    Blood pressure (!) 158/96, pulse 77, height 5\' 6"  (1.676 m), weight 152 lb 9.6 oz (69.2 kg), SpO2 95 %.  General appearance: alert and no distress Neck: no adenopathy, no carotid bruit, no JVD, supple, symmetrical, trachea midline and thyroid not enlarged, symmetric, no tenderness/mass/nodules Lungs: clear to auscultation bilaterally Heart: regular rate and rhythm, S1, S2 normal, no murmur, click, rub or gallop Extremities: extremities normal, atraumatic,  no cyanosis or edema Pulses: Diminished pedal pulses bilaterally Skin: Skin color, texture, turgor normal. No rashes or lesions Neurologic: Alert and oriented X 3, normal strength and tone. Normal symmetric reflexes. Normal coordination and gait  EKG not performed today  ASSESSMENT AND PLAN:   Peripheral arterial disease ([place]) Ms. Glendell Docker returns for follow-up.  I performed angiography on her 03/26/2019 revealing an occluded right popliteal artery extending into the origin of the all 3 tibials not percutaneously revascularizable.  She has a 99% left popliteal artery stenosis in the P2 segment.  She is more symptomatic on the right although she does have light lifestyle and claudication on the left.  I did begin her on  Pletal 3 months ago which resulted in mild improvement.  She wishes to proceed with left popliteal intervention.      Lorretta Harp MD FACP,FACC,FAHA, [facility] 07/17/2019 11:00 AM

## 2019-07-17 NOTE — Progress Notes (Signed)
07/17/2019 Margaret Kelly   [birth year]  [id number]  Primary Physician Lanae Boast, [place] Primary Cardiologist: Lorretta Harp MD [name], [place]  HPI:  Margaret Kelly is a 68 y.o.  mildly overweight divorced Caucasian female mother of 2 children, grandmother and one grandchild who is happily retired from Engineer, building services. She is was referred by Richardson Dopp, PA-C for peripheral vascular evaluation and treatment.   I last saw her in the office 04/15/2019.  She has a history of continued tobacco abuse of 1/2 pack/day which she is smoked for the last 40 years, treated hypertension and hyperlipidemia currently on Crestor. There is no family history. She is never had a heart attack or stroke. She has had right greater than left calf claudication over the last 18 months with recent Dopplers performed 02/25/2019 revealing a right ABI 0.57 and a left of 0.85 with an occluded right popliteal artery.  I performed peripheral angiography on her 03/26/2019 revealing an occluded right popliteal artery beginning above the knee and extending down into all 3 proximal tibial arteries.  She did have 90% mid right SFA stenosis as well as a 95% calcified left popliteal artery stenosis.  I do not think that she had endovascular options on the right, the site that she is most symptomatic from.  She is trying to stop smoking.  We decided to try Pletal for 67-month trial.  I do not think she is a surgical candidate for treatment of claudication.  Since I saw her 3 months ago I began her on Pletal 50 mg p.o. twice daily which is resulted in mild improvement in her claudication symptoms.  She still has right greater than left lower extremity claudication.  She wishes to proceed with left popliteal endovascular therapy.  Current Meds  Medication Sig  . acetaminophen (TYLENOL) 500 MG tablet Take 1,000 mg by mouth every 6 (six) hours as needed (for pain).  Marland Kitchen aspirin EC 81 MG tablet Take  81 mg by mouth daily.  . carvedilol (COREG) 25 MG tablet Take 1 tablet (25 mg total) by mouth 2 (two) times daily.  . cilostazol (PLETAL) 50 MG tablet Take 1 tablet (50 mg total) by mouth 2 (two) times daily.  . ciprofloxacin (CIPRO) 500 MG tablet Take 1 tablet (500 mg total) by mouth 2 (two) times daily.  . cyclobenzaprine (FLEXERIL) 10 MG tablet Take 1 tablet (10 mg total) by mouth at bedtime as needed.  . ezetimibe (ZETIA) 10 MG tablet TAKE 1 TABLET(10 MG) BY MOUTH DAILY (Patient taking differently: Take 10 mg by mouth daily. )  . Fish Oil-Cholecalciferol (FISH OIL + D3) 1200-1000 MG-UNIT CAPS Take 1 capsule by mouth daily.  Marland Kitchen ibuprofen (ADVIL) 600 MG tablet Take 1 tablet (600 mg total) by mouth every 6 (six) hours as needed.  Marland Kitchen losartan (COZAAR) 100 MG tablet TAKE 1 TABLET BY MOUTH DAILY (Patient taking differently: Take 100 mg by mouth daily. )  . MAGNESIUM CITRATE PO Take 480 mg by mouth 2 (two) times a day.  . Melatonin 12 MG TABS Take 12 mg by mouth at bedtime.  . Multiple Vitamin (MULTIVITAMIN WITH MINERALS) TABS tablet Take 1 tablet by mouth every evening. Women's One-A-Day  . omeprazole (PRILOSEC) 40 MG capsule TAKE ONE CAPSULE BY MOUTH DAILY  . rosuvastatin (CRESTOR) 10 MG tablet Take 1 tablet (10 mg total) by mouth daily.     Allergies  Allergen Reactions  . Amlodipine Other (See Comments)  Ringing in ears    Social History   Socioeconomic History  . Marital status: Divorced    Spouse name: Not on file  . Number of children: Not on file  . Years of education: Not on file  . Highest education level: Not on file  Occupational History  . Not on file  Social Needs  . Financial resource strain: Not on file  . Food insecurity    Worry: Not on file    Inability: Not on file  . Transportation needs    Medical: Not on file    Non-medical: Not on file  Tobacco Use  . Smoking status: Current Every Day Smoker    Packs/day: 0.50    Types: Cigarettes  . Smokeless tobacco:  Former Systems developer  . Tobacco comment: about half a pack per day  Substance and Sexual Activity  . Alcohol use: Yes    Comment: rare  . Drug use: No  . Sexual activity: Not on file  Lifestyle  . Physical activity    Days per week: Not on file    Minutes per session: Not on file  . Stress: Not on file  Relationships  . Social Herbalist on phone: Not on file    Gets together: Not on file    Attends religious service: Not on file    Active member of club or organization: Not on file    Attends meetings of clubs or organizations: Not on file    Relationship status: Not on file  . Intimate partner violence    Fear of current or ex partner: Not on file    Emotionally abused: Not on file    Physically abused: Not on file    Forced sexual activity: Not on file  Other Topics Concern  . Not on file  Social History Narrative  . Not on file     Review of Systems: General: negative for chills, fever, night sweats or weight changes.  Cardiovascular: negative for chest pain, dyspnea on exertion, edema, orthopnea, palpitations, paroxysmal nocturnal dyspnea or shortness of breath Dermatological: negative for rash Respiratory: negative for cough or wheezing Urologic: negative for hematuria Abdominal: negative for nausea, vomiting, diarrhea, bright red blood per rectum, melena, or hematemesis Neurologic: negative for visual changes, syncope, or dizziness All other systems reviewed and are otherwise negative except as noted above.    Blood pressure (!) 158/96, pulse 77, height 5\' 6"  (1.676 m), weight 152 lb 9.6 oz (69.2 kg), SpO2 95 %.  General appearance: alert and no distress Neck: no adenopathy, no carotid bruit, no JVD, supple, symmetrical, trachea midline and thyroid not enlarged, symmetric, no tenderness/mass/nodules Lungs: clear to auscultation bilaterally Heart: regular rate and rhythm, S1, S2 normal, no murmur, click, rub or gallop Extremities: extremities normal, atraumatic,  no cyanosis or edema Pulses: Diminished pedal pulses bilaterally Skin: Skin color, texture, turgor normal. No rashes or lesions Neurologic: Alert and oriented X 3, normal strength and tone. Normal symmetric reflexes. Normal coordination and gait  EKG not performed today  ASSESSMENT AND PLAN:   Peripheral arterial disease ([place]) Margaret Kelly returns for follow-up.  I performed angiography on her 03/26/2019 revealing an occluded right popliteal artery extending into the origin of the all 3 tibials not percutaneously revascularizable.  She has a 99% left popliteal artery stenosis in the P2 segment.  She is more symptomatic on the right although she does have light lifestyle and claudication on the left.  I did begin her on  Pletal 3 months ago which resulted in mild improvement.  She wishes to proceed with left popliteal intervention.      Lorretta Harp MD FACP,FACC,FAHA, [facility] 07/17/2019 11:00 AM

## 2019-07-21 ENCOUNTER — Telehealth: Payer: Self-pay | Admitting: Cardiovascular Disease

## 2019-07-21 NOTE — Telephone Encounter (Signed)
New Message  Patient is calling in to get more information abour procedure time and what medications she can/cannot be taking prior to her procedure. Please give patient a call back to advise.

## 2019-07-21 NOTE — Telephone Encounter (Signed)
Spoke with pt, questions regarding procedure 08/03/2019 answered.

## 2019-07-29 ENCOUNTER — Telehealth: Payer: Self-pay | Admitting: *Deleted

## 2019-07-29 NOTE — Telephone Encounter (Addendum)
Pt contacted pre-abdominal aortogram  scheduled at [facility] for: Monday August 03, 2019 1:30 PM Verified arrival time and place: [place] [facility]) at: 11:30 AM   No solid food after midnight prior to cath, clear liquids until 5 AM day of procedure. Contrast allergy: no   AM meds can be  taken pre-cath with sip of water including: ASA 81 mg   Confirmed patient has responsible adult to drive home post procedure and observe 24 hours after arriving home:  yes  Currently, due to Covid-19 pandemic, only one support person will be allowed with patient. Must be the same support person for that patient's entire stay, will be screened and required to wear a mask. They will be asked to wait in the waiting room for the duration of the patient's stay.  Patients are required to wear a mask when they enter the hospital.     COVID-19 Pre-Screening Questions:  . In the past 7 to 10 days have you had a cough,  shortness of breath, headache, congestion, fever (100 or greater) body aches, chills, sore throat, or sudden loss of taste or sense of smell? no . Have you been around anyone with known Covid 19? no . Have you been around anyone who is awaiting Covid 19 test results in the past 7 to 10 days? no . Have you been around anyone who has been exposed to Covid 19, or has mentioned symptoms of Covid 19 within the past 7 to 10 days? no  I reviewed procedure/mask/visitor instructions, Covid-19 screening questions with patient, she verbalized understanding, thanked me for call.

## 2019-07-31 ENCOUNTER — Other Ambulatory Visit (HOSPITAL_COMMUNITY)
Admission: RE | Admit: 2019-07-31 | Discharge: 2019-07-31 | Disposition: A | Discharge status: Home / Self Care | Payer: Medicare Other | Source: Ambulatory Visit | Attending: Cardiovascular Disease | Admitting: Cardiovascular Disease

## 2019-07-31 DIAGNOSIS — Z01812 Encounter for preprocedural laboratory examination: Secondary | ICD-10-CM | POA: Insufficient documentation

## 2019-07-31 DIAGNOSIS — Z20828 Contact with and (suspected) exposure to other viral communicable diseases: Secondary | ICD-10-CM | POA: Diagnosis not present

## 2019-07-31 LAB — SARS CORONAVIRUS 2 (TAT 6-24 HRS): SARS Coronavirus 2: NEGATIVE

## 2019-08-03 ENCOUNTER — Other Ambulatory Visit: Payer: Self-pay

## 2019-08-03 ENCOUNTER — Ambulatory Visit (HOSPITAL_COMMUNITY)
Admission: RE | Admit: 2019-08-03 | Discharge: 2019-08-04 | Disposition: A | Discharge status: Home / Self Care | Payer: Medicare Other | Attending: Cardiovascular Disease | Admitting: Cardiovascular Disease

## 2019-08-03 ENCOUNTER — Encounter (HOSPITAL_COMMUNITY)
Admission: RE | Disposition: A | Discharge status: Home / Self Care | Payer: Self-pay | Source: Home / Self Care | Attending: Cardiovascular Disease

## 2019-08-03 DIAGNOSIS — I10 Essential (primary) hypertension: Secondary | ICD-10-CM | POA: Diagnosis not present

## 2019-08-03 DIAGNOSIS — I739 Peripheral vascular disease, unspecified: Secondary | ICD-10-CM | POA: Diagnosis not present

## 2019-08-03 DIAGNOSIS — E785 Hyperlipidemia, unspecified: Secondary | ICD-10-CM | POA: Diagnosis not present

## 2019-08-03 DIAGNOSIS — Z79899 Other long term (current) drug therapy: Secondary | ICD-10-CM | POA: Insufficient documentation

## 2019-08-03 DIAGNOSIS — Z7902 Long term (current) use of antithrombotics/antiplatelets: Secondary | ICD-10-CM | POA: Insufficient documentation

## 2019-08-03 DIAGNOSIS — Z791 Long term (current) use of non-steroidal anti-inflammatories (NSAID): Secondary | ICD-10-CM | POA: Insufficient documentation

## 2019-08-03 DIAGNOSIS — M79605 Pain in left leg: Secondary | ICD-10-CM

## 2019-08-03 DIAGNOSIS — M79604 Pain in right leg: Secondary | ICD-10-CM

## 2019-08-03 DIAGNOSIS — F17210 Nicotine dependence, cigarettes, uncomplicated: Secondary | ICD-10-CM | POA: Diagnosis not present

## 2019-08-03 DIAGNOSIS — I70212 Atherosclerosis of native arteries of extremities with intermittent claudication, left leg: Secondary | ICD-10-CM | POA: Diagnosis not present

## 2019-08-03 HISTORY — PX: PERIPHERAL VASCULAR BALLOON ANGIOPLASTY: CATH118281

## 2019-08-03 HISTORY — PX: ABDOMINAL AORTOGRAM W/LOWER EXTREMITY: CATH118223

## 2019-08-03 HISTORY — DX: Peripheral vascular disease, unspecified: I73.9

## 2019-08-03 LAB — POCT ACTIVATED CLOTTING TIME
Activated Clotting Time: 274 seconds
Activated Clotting Time: 301 seconds

## 2019-08-03 LAB — POTASSIUM: Potassium: 3.6 mmol/L (ref 3.5–5.1)

## 2019-08-03 SURGERY — ABDOMINAL AORTOGRAM W/LOWER EXTREMITY
Anesthesia: LOCAL | Laterality: Left

## 2019-08-03 MED ORDER — IODIXANOL 320 MG/ML IV SOLN
INTRAVENOUS | Status: DC | PRN
  Administered 2019-08-03: 17:00:00 165 mL via INTRA_ARTERIAL

## 2019-08-03 MED ORDER — HEPARIN SODIUM (PORCINE) 1000 UNIT/ML IJ SOLN
INTRAMUSCULAR | Status: DC | PRN
  Administered 2019-08-03: 3000 [IU] via INTRAVENOUS
  Administered 2019-08-03: 7000 [IU] via INTRAVENOUS

## 2019-08-03 MED ORDER — HYDRALAZINE HCL 20 MG/ML IJ SOLN
INTRAMUSCULAR | Status: AC
  Filled 2019-08-03: qty 1

## 2019-08-03 MED ORDER — CLOPIDOGREL BISULFATE 300 MG PO TABS
ORAL_TABLET | ORAL | Status: AC
  Filled 2019-08-03: qty 1

## 2019-08-03 MED ORDER — FENTANYL CITRATE (PF) 100 MCG/2ML IJ SOLN
INTRAMUSCULAR | Status: DC | PRN
  Administered 2019-08-03: 25 ug via INTRAVENOUS
  Administered 2019-08-03: 50 ug via INTRAVENOUS
  Administered 2019-08-03: 25 ug via INTRAVENOUS

## 2019-08-03 MED ORDER — HEPARIN (PORCINE) IN NACL 1000-0.9 UT/500ML-% IV SOLN
INTRAVENOUS | Status: DC | PRN
  Administered 2019-08-03 (×2): 500 mL

## 2019-08-03 MED ORDER — VIPERSLIDE LUBRICANT OPTIME
TOPICAL | Status: DC | PRN
  Administered 2019-08-03: 20 mL via SURGICAL_CAVITY

## 2019-08-03 MED ORDER — SODIUM CHLORIDE 0.9 % WEIGHT BASED INFUSION: 1.0000 mL/kg/h | INTRAVENOUS | Status: DC

## 2019-08-03 MED ORDER — LIDOCAINE HCL (PF) 1 % IJ SOLN
INTRAMUSCULAR | Status: AC
  Filled 2019-08-03: qty 30

## 2019-08-03 MED ORDER — SODIUM CHLORIDE 0.9 % IV SOLN: 250.0000 mL | INTRAVENOUS | Status: DC | PRN

## 2019-08-03 MED ORDER — NITROGLYCERIN 1 MG/10 ML FOR IR/CATH LAB
INTRA_ARTERIAL | Status: DC | PRN
  Administered 2019-08-03 (×4): 200 ug via INTRA_ARTERIAL
  Administered 2019-08-03: 200 ug

## 2019-08-03 MED ORDER — LOSARTAN POTASSIUM 50 MG PO TABS
100.0000 mg | ORAL_TABLET | Freq: Every day | ORAL | Status: DC
  Filled 2019-08-03: qty 2

## 2019-08-03 MED ORDER — FENTANYL CITRATE (PF) 100 MCG/2ML IJ SOLN
INTRAMUSCULAR | Status: AC
  Filled 2019-08-03: qty 2

## 2019-08-03 MED ORDER — ASPIRIN 81 MG PO CHEW: 81.0000 mg | CHEWABLE_TABLET | ORAL | Status: DC

## 2019-08-03 MED ORDER — SODIUM CHLORIDE 0.9 % WEIGHT BASED INFUSION
3.0000 mL/kg/h | INTRAVENOUS | Status: DC
  Administered 2019-08-03: 3 mL/kg/h via INTRAVENOUS

## 2019-08-03 MED ORDER — ACETAMINOPHEN 325 MG PO TABS: 650.0000 mg | ORAL_TABLET | ORAL | Status: DC | PRN

## 2019-08-03 MED ORDER — HEPARIN SODIUM (PORCINE) 1000 UNIT/ML IJ SOLN
INTRAMUSCULAR | Status: AC
  Filled 2019-08-03: qty 1

## 2019-08-03 MED ORDER — MIDAZOLAM HCL 2 MG/2ML IJ SOLN
INTRAMUSCULAR | Status: DC | PRN
  Administered 2019-08-03 (×2): 1 mg via INTRAVENOUS

## 2019-08-03 MED ORDER — CLOPIDOGREL BISULFATE 75 MG PO TABS
75.0000 mg | ORAL_TABLET | Freq: Every day | ORAL | Status: DC
  Filled 2019-08-03: qty 1

## 2019-08-03 MED ORDER — SODIUM CHLORIDE 0.9% FLUSH: 3.0000 mL | INTRAVENOUS | Status: DC | PRN

## 2019-08-03 MED ORDER — ASPIRIN EC 81 MG PO TBEC
81.0000 mg | DELAYED_RELEASE_TABLET | Freq: Every day | ORAL | Status: DC
  Filled 2019-08-03: qty 1

## 2019-08-03 MED ORDER — NITROGLYCERIN 1 MG/10 ML FOR IR/CATH LAB
INTRA_ARTERIAL | Status: AC
  Filled 2019-08-03: qty 10

## 2019-08-03 MED ORDER — CARVEDILOL 25 MG PO TABS
25.0000 mg | ORAL_TABLET | Freq: Two times a day (BID) | ORAL | Status: DC
  Administered 2019-08-03: 25 mg via ORAL
  Filled 2019-08-03: qty 1

## 2019-08-03 MED ORDER — CILOSTAZOL 100 MG PO TABS
50.0000 mg | ORAL_TABLET | Freq: Two times a day (BID) | ORAL | Status: DC
  Administered 2019-08-03: 50 mg via ORAL
  Filled 2019-08-03: qty 1

## 2019-08-03 MED ORDER — CYCLOBENZAPRINE HCL 10 MG PO TABS: 10.0000 mg | ORAL_TABLET | Freq: Three times a day (TID) | ORAL | Status: DC | PRN

## 2019-08-03 MED ORDER — ONDANSETRON HCL 4 MG/2ML IJ SOLN: 4.0000 mg | Freq: Four times a day (QID) | INTRAMUSCULAR | Status: DC | PRN

## 2019-08-03 MED ORDER — NITROGLYCERIN IN D5W 200-5 MCG/ML-% IV SOLN
INTRAVENOUS | Status: AC
  Filled 2019-08-03: qty 250

## 2019-08-03 MED ORDER — EZETIMIBE 10 MG PO TABS: 10.0000 mg | ORAL_TABLET | Freq: Every day | ORAL | Status: DC

## 2019-08-03 MED ORDER — LABETALOL HCL 5 MG/ML IV SOLN: 10.0000 mg | INTRAVENOUS | Status: DC | PRN

## 2019-08-03 MED ORDER — HYDRALAZINE HCL 20 MG/ML IJ SOLN
INTRAMUSCULAR | Status: DC | PRN
  Administered 2019-08-03: 10 mg via INTRAVENOUS

## 2019-08-03 MED ORDER — MIDAZOLAM HCL 2 MG/2ML IJ SOLN
INTRAMUSCULAR | Status: AC
  Filled 2019-08-03: qty 2

## 2019-08-03 MED ORDER — CLOPIDOGREL BISULFATE 300 MG PO TABS
ORAL_TABLET | ORAL | Status: DC | PRN
  Administered 2019-08-03: 300 mg via ORAL

## 2019-08-03 MED ORDER — MORPHINE SULFATE (PF) 2 MG/ML IV SOLN
2.0000 mg | INTRAVENOUS | Status: DC | PRN
  Administered 2019-08-03: 2 mg via INTRAVENOUS
  Filled 2019-08-03: qty 1

## 2019-08-03 MED ORDER — HYDRALAZINE HCL 20 MG/ML IJ SOLN: 5.0000 mg | INTRAMUSCULAR | Status: DC | PRN

## 2019-08-03 MED ORDER — ASPIRIN EC 81 MG PO TBEC: 81.0000 mg | DELAYED_RELEASE_TABLET | Freq: Every day | ORAL | Status: DC

## 2019-08-03 MED ORDER — ROSUVASTATIN CALCIUM 5 MG PO TABS
5.0000 mg | ORAL_TABLET | Freq: Every day | ORAL | Status: DC
  Administered 2019-08-03: 5 mg via ORAL
  Filled 2019-08-03: qty 1

## 2019-08-03 MED ORDER — SODIUM CHLORIDE 0.9 % IV SOLN
INTRAVENOUS | Status: AC
  Administered 2019-08-03: 22:00:00 via INTRAVENOUS

## 2019-08-03 MED ORDER — SODIUM CHLORIDE 0.9% FLUSH
3.0000 mL | Freq: Two times a day (BID) | INTRAVENOUS | Status: DC
  Administered 2019-08-03: 3 mL via INTRAVENOUS

## 2019-08-03 MED ORDER — VERAPAMIL HCL 2.5 MG/ML IV SOLN
INTRAVENOUS | Status: AC
  Filled 2019-08-03: qty 2

## 2019-08-03 MED ORDER — LIDOCAINE HCL (PF) 1 % IJ SOLN
INTRAMUSCULAR | Status: DC | PRN
  Administered 2019-08-03: 15 mL via INTRADERMAL

## 2019-08-03 MED ORDER — PANTOPRAZOLE SODIUM 40 MG PO TBEC
40.0000 mg | DELAYED_RELEASE_TABLET | Freq: Every day | ORAL | Status: DC
  Administered 2019-08-03: 40 mg via ORAL
  Filled 2019-08-03 (×2): qty 1

## 2019-08-03 MED ORDER — HEPARIN (PORCINE) IN NACL 1000-0.9 UT/500ML-% IV SOLN
INTRAVENOUS | Status: AC
  Filled 2019-08-03: qty 1000

## 2019-08-03 SURGICAL SUPPLY — 30 items
BALLN CHOCOLATE 4.0X40X135 (BALLOONS) ×3
BALLN IN.PACT DCB 5X60 (BALLOONS) ×3
BALLN IN.PACT DCB 6X60 (BALLOONS) ×3
BALLOON CHOCOLATE 4.0X40X135 (BALLOONS) ×2 IMPLANT
CATH ANGIO 5F PIGTAIL 65CM (CATHETERS) ×3 IMPLANT
CATH CROSS OVER TEMPO 5F (CATHETERS) ×3 IMPLANT
CATH CXI SUPP 2.6F 150 ST (CATHETERS) ×3 IMPLANT
CATH STRAIGHT 5FR 65CM (CATHETERS) IMPLANT
CLOSURE MYNX CONTROL 6F/7F (Vascular Products) ×3 IMPLANT
DCB IN.PACT 5X60 (BALLOONS) ×2 IMPLANT
DCB IN.PACT 6X60 (BALLOONS) ×2 IMPLANT
DEVICE EMBOSHIELD NAV6 2.5-4.8 (FILTER) ×3 IMPLANT
DIAMONDBACK SOLID OAS 1.5MM (CATHETERS) ×3
GUIDEWIRE REGALIA .014X300CM (WIRE) ×3 IMPLANT
KIT ENCORE 26 ADVANTAGE (KITS) ×3 IMPLANT
KIT PV (KITS) ×3 IMPLANT
LUBRICANT VIPERSLIDE CORONARY (MISCELLANEOUS) ×3 IMPLANT
SHEATH HIGHFLEX ANSEL 7FR 55CM (SHEATH) ×3 IMPLANT
SHEATH PINNACLE 5F 10CM (SHEATH) ×3 IMPLANT
SHEATH PINNACLE 7F 10CM (SHEATH) ×3 IMPLANT
STOPCOCK MORSE 400PSI 3WAY (MISCELLANEOUS) ×6 IMPLANT
SYR MEDRAD MARK 7 150ML (SYRINGE) ×3 IMPLANT
SYSTEM DIMNDBCK SLD OAS 1.5MM (CATHETERS) ×2 IMPLANT
TAPE VIPERTRACK RADIOPAQ (MISCELLANEOUS) ×2 IMPLANT
TAPE VIPERTRACK RADIOPAQUE (MISCELLANEOUS) ×1
TRANSDUCER W/STOPCOCK (MISCELLANEOUS) ×3 IMPLANT
TRAY PV CATH (CUSTOM PROCEDURE TRAY) ×3 IMPLANT
TUBING CIL FLEX 10 FLL-RA (TUBING) ×3 IMPLANT
WIRE HITORQ VERSACORE ST 145CM (WIRE) ×3 IMPLANT
WIRE VIPER ADVANCE .017X335CM (WIRE) ×3 IMPLANT

## 2019-08-03 NOTE — Interval H&P Note (Signed)
History and Physical Interval Note:  08/03/2019 2:52 PM  Margaret Kelly  has presented today for surgery, with the diagnosis of PAD.  The various methods of treatment have been discussed with the patient and family. After consideration of risks, benefits and other options for treatment, the patient has consented to  Procedure(s): ABDOMINAL AORTOGRAM W/LOWER EXTREMITY (Bilateral) as a surgical intervention.  The patient's history has been reviewed, patient examined, no change in status, stable for surgery.  I have reviewed the patient's chart and labs.  Questions were answered to the patient's satisfaction.     Quay Burow

## 2019-08-04 ENCOUNTER — Other Ambulatory Visit: Payer: Self-pay

## 2019-08-04 ENCOUNTER — Encounter (HOSPITAL_COMMUNITY): Payer: Self-pay | Admitting: Cardiovascular Disease

## 2019-08-04 DIAGNOSIS — I739 Peripheral vascular disease, unspecified: Secondary | ICD-10-CM | POA: Diagnosis not present

## 2019-08-04 DIAGNOSIS — Z79899 Other long term (current) drug therapy: Secondary | ICD-10-CM | POA: Diagnosis not present

## 2019-08-04 DIAGNOSIS — I10 Essential (primary) hypertension: Secondary | ICD-10-CM | POA: Diagnosis not present

## 2019-08-04 DIAGNOSIS — Z7902 Long term (current) use of antithrombotics/antiplatelets: Secondary | ICD-10-CM | POA: Diagnosis not present

## 2019-08-04 DIAGNOSIS — E785 Hyperlipidemia, unspecified: Secondary | ICD-10-CM | POA: Diagnosis not present

## 2019-08-04 DIAGNOSIS — Z791 Long term (current) use of non-steroidal anti-inflammatories (NSAID): Secondary | ICD-10-CM | POA: Diagnosis not present

## 2019-08-04 LAB — BASIC METABOLIC PANEL
Anion gap: 9 (ref 5–15)
BUN: 8 mg/dL (ref 8–23)
CO2: 24 mmol/L (ref 22–32)
Calcium: 8.8 mg/dL — ABNORMAL LOW (ref 8.9–10.3)
Chloride: 108 mmol/L (ref 98–111)
Creatinine, Ser: 0.67 mg/dL (ref 0.44–1.00)
GFR calc Af Amer: >60 mL/min (ref >60)
GFR calc non Af Amer: >60 mL/min (ref >60)
Glucose, Bld: 99 mg/dL (ref 70–99)
Potassium: 3.6 mmol/L (ref 3.5–5.1)
Sodium: 141 mmol/L (ref 135–145)

## 2019-08-04 LAB — CBC
HCT: 37.3 % (ref 36.0–46.0)
Hemoglobin: 12.6 g/dL (ref 12.0–15.0)
MCH: 32.1 pg (ref 26.0–34.0)
MCHC: 33.8 g/dL (ref 30.0–36.0)
MCV: 94.9 fL (ref 80.0–100.0)
Platelets: 263 10*3/uL (ref 150–400)
RBC: 3.93 MIL/uL (ref 3.87–5.11)
RDW: 13.7 % (ref 11.5–15.5)
WBC: 9.3 10*3/uL (ref 4.0–10.5)
nRBC: 0 % (ref 0.0–0.2)

## 2019-08-04 MED ORDER — PANTOPRAZOLE SODIUM 40 MG PO TBEC: 40.0000 mg | DELAYED_RELEASE_TABLET | Freq: Every day | ORAL | 3 refills | Status: DC

## 2019-08-04 MED ORDER — CLOPIDOGREL BISULFATE 75 MG PO TABS: 75.0000 mg | ORAL_TABLET | Freq: Every day | ORAL | 11 refills | Status: DC

## 2019-08-04 NOTE — Discharge Summary (Signed)
Discharge Summary    Patient ID: Margaret Kelly MRN: [id number]; DOB: April 01, 1951  Admit date: 08/03/2019 Discharge date: 08/04/2019  Primary Care Provider: Lanae Boast, [place]  Primary Cardiologist: Fransico Him, MD / Dr. Gwenlyn Found  Discharge Diagnoses    Active Problems:   Peripheral arterial disease ([place])   Claudication in peripheral vascular disease ([place])    Diagnostic Studies/Procedures    PERIPHERAL VASCULAR BALLOON ANGIOPLASTY  ABDOMINAL AORTOGRAM W/LOWER EXTREMITY   Procedures Performed:               1.  Ultrasound-guided right common femoral access               2.  Contralateral access (secondary catheter placement)               3.  Placement of an NAV 6 distal protection device in the tibioperoneal trunk               4.  Diamondback orbital rotational atherectomy distal left SFA and popliteal artery               5.  DCB distal left SFA and popliteal artery               6.  Right common femoral angiogram, Mynx closure  Final Impression: Successful orbital atherectomy, chocolate balloon angioplasty followed by [facility] of the distal left SFA and highly calcified left popliteal artery using a NAV 6 distal protection with excellent angiographic result.  The groin was hemostatically sealed with a MYNX closure device.  Patient received 300 mg of p.o. Plavix.  She left lab in stable condition.  She will be gently hydrated overnight, discharged home in the morning on dual antiplatelet therapy.  We will obtain lower extremity arterial Doppler studies in our [facility] line office next week and I will see her back the week after for follow-up.    History of Present Illness     Margaret Kelly is a 68 y.o. female with hx of HTN, HLD, tobacco abuse and PAD presented for scheduled PV angiogram.   She has had right greater than left calf claudication over the last 18 months with recent Dopplers performed 02/25/2019 revealing a right ABI 0.57 and a left of 0.85 with an occluded right  popliteal artery. She had peripheral angiography  03/26/2019 revealing an occluded right popliteal artery beginning above the knee and extending down into all 3 proximal tibial arteries. She did have 90% mid right SFA stenosis as well as a 95% calcified left popliteal artery stenosis. No endovascular options on the right for which has most symptoms. Started on Pletal BID with mild improvement in her claudication symptoms.  She continued to have right greater than left lower extremity claudication.  She wishes to proceed with left popliteal endovascular therapy.  Hospital Course     Consultants: None  She underwent successful orbital atherectomy, chocolate balloon angioplasty followed by [facility] of the distal left SFA and highly calcified left popliteal artery using a NAV 6 distal protection with excellent angiographic result. No complication. Admitted for overnight hydration. Renal function and groin site stable. DAPT with ASA and Plavix.   The patient been seen by Dr. Harrell Gave today and deemed ready for discharge home. All follow-up appointments have been scheduled. Discharge medications are listed below.   Did the patient have an acute coronary syndrome (MI, NSTEMI, STEMI, etc) this admission?:  No  Did the patient have a percutaneous coronary intervention (stent / angioplasty)?:  No.    Discharge Vitals Blood pressure (!) 142/130, pulse 73, temperature 98.4 F (36.9 C), temperature source Oral, resp. rate 18, height 5\' 6"  (1.676 m), weight 69.3 kg, SpO2 96 %.  Filed Weights   08/03/19 1137 08/04/19 0300  Weight: 68.6 kg 69.3 kg    Labs & Radiologic Studies    CBC Recent Labs    08/04/19 0340  WBC 9.3  HGB 12.6  HCT 37.3  MCV 94.9  PLT 99991111   Basic Metabolic Panel Recent Labs    08/03/19 1312 08/04/19 0340  NA  --  141  K 3.6 3.6  CL  --  108  CO2  --  24  GLUCOSE  --  99  BUN  --  8  CREATININE  --  0.67  CALCIUM  --  8.8*    Disposition    Pt is being discharged home today in good condition.  Follow-up Plans & Appointments    Follow-up Information    [facility]. Go on 08/10/2019.   Specialty: Cardiology Why: @1  pm for LE doppler  Contact information: [address] [place] [phone]       Lorretta Harp, MD. Go on 08/18/2019.   Specialties: Cardiology, Radiology Why: @9 :45am for procedure follow up Contact information: [address] [place] [address] [phone]          Discharge Instructions    Diet - low sodium heart healthy   Complete by: As directed    Discharge instructions   Complete by: As directed    No driving for 48 hours. No lifting over 5 lbs for 1 week. No sexual activity for 1 week. You may return to work on 08/17/2019. Keep procedure site clean & dry. If you notice increased pain, swelling, bleeding or pus, call/return!  You may shower, but no soaking baths/hot tubs/pools for 1 week.  Some studies suggest Prilosec/Omeprazole interacts with Plavix. We changed your Prilosec/Omeprazole to Protonix for less chance of interaction.   Increase activity slowly   Complete by: As directed       Discharge Medications   Allergies as of 08/04/2019      Reactions   Amlodipine Other (See Comments)   Ringing in ears      Medication List    STOP taking these medications   omeprazole 40 MG capsule Commonly known as: PRILOSEC Replaced by: pantoprazole 40 MG tablet     TAKE these medications   aspirin EC 81 MG tablet Take 81 mg by mouth daily.   carvedilol 25 MG tablet Commonly known as: COREG Take 1 tablet (25 mg total) by mouth 2 (two) times daily.   cilostazol 50 MG tablet Commonly known as: PLETAL Take 1 tablet (50 mg total) by mouth 2 (two) times daily.   clopidogrel 75 MG tablet Commonly known as: PLAVIX Take 1 tablet (75 mg total) by mouth daily with breakfast.   cyclobenzaprine 10 MG tablet  Commonly known as: FLEXERIL Take 1 tablet (10 mg total) by mouth at bedtime as needed. What changed: when to take this   ezetimibe 10 MG tablet Commonly known as: ZETIA TAKE 1 TABLET(10 MG) BY MOUTH DAILY What changed: See the new instructions.   Fish Oil + D3 1200-1000 MG-UNIT Caps Take 1 capsule by mouth daily.   ibuprofen 200 MG tablet Commonly known as: ADVIL Take 400 mg by mouth at  bedtime.   losartan 100 MG tablet Commonly known as: COZAAR TAKE 1 TABLET BY MOUTH DAILY   MAGNESIUM CITRATE PO Take 480 mg by mouth 2 (two) times a day.   Melatonin 12 MG Tabs Take 12 mg by mouth at bedtime.   multivitamin with minerals Tabs tablet Take 1 tablet by mouth every evening. Women's One-A-Day   pantoprazole 40 MG tablet Commonly known as: PROTONIX Take 1 tablet (40 mg total) by mouth daily. Replaces: omeprazole 40 MG capsule   rosuvastatin 5 MG tablet Commonly known as: CRESTOR Take 5 mg by mouth daily.          Outstanding Labs/Studies   None  Duration of Discharge Encounter   Greater than 30 minutes including physician time.  Jarrett Soho, PA 08/04/2019, 10:17 AM

## 2019-08-04 NOTE — Progress Notes (Signed)
Progress Note  Patient Name: Margaret Kelly Date of Encounter: 08/04/2019  Primary Cardiologist: Fransico Him, MD   Subjective   Underwent peripheral intervention by Dr. Gwenlyn Found yesterday. Feeling well this AM, mildly sore in R femoral access site area. Discussed what to watch for, plans for next steps. All questions answered.  Inpatient Medications    Scheduled Meds: . aspirin EC  81 mg Oral Daily  . carvedilol  25 mg Oral BID  . cilostazol  50 mg Oral BID  . clopidogrel  75 mg Oral Q breakfast  . ezetimibe  10 mg Oral Daily  . losartan  100 mg Oral Daily  . pantoprazole  40 mg Oral Daily  . rosuvastatin  5 mg Oral Daily  . sodium chloride flush  3 mL Intravenous Q12H   Continuous Infusions: . sodium chloride     PRN Meds: sodium chloride, acetaminophen, cyclobenzaprine, hydrALAZINE, labetalol, morphine injection, ondansetron (ZOFRAN) IV, sodium chloride flush   Vital Signs    Vitals:   08/04/19 0019 08/04/19 0300 08/04/19 0313 08/04/19 0848  BP: (!) 141/65  (!) 149/71 (!) 142/130  Pulse: 73 65 69 73  Resp:   18 18  Temp:   98 F (36.7 C) 98.4 F (36.9 C)  TempSrc:   Oral Oral  SpO2: 98% 96% 97% 96%  Weight:  69.3 kg    Height:        Intake/Output Summary (Last 24 hours) at 08/04/2019 0926 Last data filed at 08/04/2019 [id number] Gross per 24 hour  Intake 574.56 ml  Output 900 ml  Net -325.44 ml   Last 3 Weights 08/04/2019 08/03/2019 07/17/2019  Weight (lbs) 152 lb 12.8 oz 151 lb 3.2 oz 152 lb 9.6 oz  Weight (kg) 69.31 kg 68.584 kg 69.219 kg      Telemetry    NSR, rare PVC - Personally Reviewed  ECG    No recent - Personally Reviewed  Physical Exam   GEN: No acute distress.   Neck: No JVD Cardiac: RRR, no murmurs, rubs, or gallops.  Respiratory: Clear to auscultation bilaterally. GI: Soft, nontender, non-distended  MS: No edema; No deformity. Neuro:  Nonfocal  Psych: Normal affect  Vascular: R femoral sheath site with bruising   Labs    High  Sensitivity Troponin:  No results for input(s): TROPONINIHS in the last 720 hours.    Chemistry Recent Labs  Lab 08/03/19 1312 08/04/19 0340  NA  --  141  K 3.6 3.6  CL  --  108  CO2  --  24  GLUCOSE  --  99  BUN  --  8  CREATININE  --  0.67  CALCIUM  --  8.8*  GFRNONAA  --  >60  GFRAA  --  >60  ANIONGAP  --  9     Hematology Recent Labs  Lab 08/04/19 0340  WBC 9.3  RBC 3.93  HGB 12.6  HCT 37.3  MCV 94.9  MCH 32.1  MCHC 33.8  RDW 13.7  PLT 263    BNPNo results for input(s): BNP, PROBNP in the last 168 hours.   DDimer No results for input(s): DDIMER in the last 168 hours.   Radiology    No results found.  Cardiac Studies   Abdominal aortogram/LE with angioplasty 08/03/19 Final Impression: Successful orbital atherectomy, chocolate balloon angioplasty followed by [facility] of the distal left SFA and highly calcified left popliteal artery using a NAV 6 distal protection with excellent angiographic result.  The groin was  hemostatically sealed with a MYNX closure device.  Patient received 300 mg of p.o. Plavix.  She left lab in stable condition.  She will be gently hydrated overnight, discharged home in the morning on dual antiplatelet therapy.  We will obtain lower extremity arterial Doppler studies in our [facility] line office next week and I will see her back the week after for follow-up.  Patient Profile     68 y.o. female with tobacco abuse, hypertension, hyperlipidemia, PAD with claudication who underwent peripheral intervention 08/03/19 with Dr. Gwenlyn Found  Assessment & Plan    Post peripheral intervention by Dr. Gwenlyn Found yesterday. Hydrated/monitoring overnight. Doing well. Labs stable, sites intact.   Already ordered for vascular imaging 12/7, has follow up planned with Dr. Gwenlyn Found 12/15  Tobacco cessation  DAPT, statin, ezetimibe, cilostazol, losartan, and carvedilol at discharge  Counseled on red flag warning signs, what to watch for, when to call the office  Plan for  discharge today.  For questions or updates, please contact [place] Please consult [email or web address] for contact info under        Signed, Buford Dresser, MD  08/04/2019, 9:26 AM

## 2019-08-05 ENCOUNTER — Ambulatory Visit: Payer: Self-pay | Admitting: Family Medicine

## 2019-08-10 ENCOUNTER — Encounter (HOSPITAL_COMMUNITY): Payer: Medicare Other

## 2019-08-10 ENCOUNTER — Ambulatory Visit (HOSPITAL_COMMUNITY)
Admission: RE | Admit: 2019-08-10 | Discharge: 2019-08-10 | Disposition: A | Discharge status: Home / Self Care | Payer: Medicare Other | Source: Ambulatory Visit | Attending: Cardiology | Admitting: Cardiology

## 2019-08-10 ENCOUNTER — Other Ambulatory Visit (HOSPITAL_COMMUNITY): Payer: Self-pay | Admitting: Cardiovascular Disease

## 2019-08-10 ENCOUNTER — Other Ambulatory Visit: Payer: Self-pay

## 2019-08-10 DIAGNOSIS — I739 Peripheral vascular disease, unspecified: Secondary | ICD-10-CM

## 2019-08-10 DIAGNOSIS — M79604 Pain in right leg: Secondary | ICD-10-CM

## 2019-08-10 DIAGNOSIS — M79605 Pain in left leg: Secondary | ICD-10-CM | POA: Insufficient documentation

## 2019-08-11 ENCOUNTER — Other Ambulatory Visit: Payer: Self-pay

## 2019-08-11 DIAGNOSIS — I739 Peripheral vascular disease, unspecified: Secondary | ICD-10-CM

## 2019-08-18 ENCOUNTER — Ambulatory Visit: Payer: Medicare Other | Admitting: Cardiovascular Disease

## 2019-08-19 ENCOUNTER — Encounter: Payer: Self-pay | Admitting: Cardiovascular Disease

## 2019-08-19 ENCOUNTER — Telehealth (INDEPENDENT_AMBULATORY_CARE_PROVIDER_SITE_OTHER): Payer: Medicare Other | Admitting: Cardiovascular Disease

## 2019-08-19 DIAGNOSIS — I739 Peripheral vascular disease, unspecified: Secondary | ICD-10-CM

## 2019-08-19 NOTE — Progress Notes (Signed)
Virtual Visit via Telephone Note   This visit type was conducted due to national recommendations for restrictions regarding the COVID-19 Pandemic (e.g. social distancing) in an effort to limit this patient's exposure and mitigate transmission in our community.  Due to her co-morbid illnesses, this patient is at least at moderate risk for complications without adequate follow up.  This format is felt to be most appropriate for this patient at this time.  The patient did not have access to video technology/had technical difficulties with video requiring transitioning to audio format only (telephone).  All issues noted in this document were discussed and addressed.  No physical exam could be performed with this format.  Please refer to the patient's chart for her  consent to telehealth for [facility].   Date:  08/19/2019   ID:  Margaret Kelly, DOB [birth year], MRN [id number]  Patient Location: Home Provider Location: Home  PCP:  Lanae Boast, [place]  Cardiologist:  Fransico Him, MD Peripheral vascular cardiologist: Dr. Donnella Bi Electrophysiologist:  None   Evaluation Performed:  Follow-Up Visit  Chief Complaint: Peripheral arterial disease  History of Present Illness:    Margaret Kelly is a 68 y.o.  mildly overweight divorced Caucasian female mother of 2 children, grandmother and one grandchild who is happily retired from Engineer, building services. She is was referred by Richardson Dopp, PA-C for peripheral vascular evaluation and treatment.I last saw her in the office  07/17/2019. She has a history of continued tobacco abuse of 1/2 pack/day which she is smoked for the last 40 years, treated hypertension and hyperlipidemia currently on Crestor. There is no family history. She is never had a heart attack or stroke. She has had right greater than left calf claudication over the last 18 months with recent Dopplers performed 02/25/2019 revealing a right ABI 0.57 and a  left of 0.85 with an occluded right popliteal artery.  I performed peripheral angiography on her 03/26/2019 revealing an occluded right popliteal artery beginning above the knee and extending down into all 3 proximal tibial arteries. She did have 90% mid right SFA stenosis as well as a 95% calcified left popliteal artery stenosis. I do not think that she had endovascular options on the right, the site that she is most symptomatic from. She is trying to stop smoking. We decided to try Pletal for 39-month trial. I do not think she is a surgical candidate for treatment of claudication.  I began her on Pletal 50 mg p.o. twice daily which is resulted in mild improvement in her claudication symptoms.  She still had right greater than left lower extremity claudication.  She wishes to proceed with left popliteal endovascular therapy.  I performed peripheral angiography on her 08/03/2019.  I performed orbital atherectomy, drug-coated balloon angioplasty of the highly calcified distal left SFA and popliteal artery using distal protection.  She was discharged home the following day.  She does have ecchymosis down the medial aspect of her right thigh.  Her Dopplers performed 08/10/2019 showed marked improvement.  Her claudication has resolved.  She does continue to smoke however.  The patient does not have symptoms concerning for COVID-19 infection (fever, chills, cough, or new shortness of breath).    Past Medical History:  Diagnosis Date  . Anxiety   . Blood transfusion without reported diagnosis 1982  . GERD (gastroesophageal reflux disease)   . History of hepatitis C 10/13/2015  . Hyperlipidemia LDL goal <100 10/13/2015  . Hypertension   . Muscle spasm  of left lower extremity 10/13/2015  . Nerve damage 2001   left side as result of fall  . PAD (peripheral artery disease) ([place])   . Pain of left hip joint 02/21/2018  . Plantar wart    Past Surgical History:  Procedure Laterality Date  . ABDOMINAL  AORTOGRAM W/LOWER EXTREMITY Bilateral 03/26/2019   Procedure: ABDOMINAL AORTOGRAM W/LOWER EXTREMITY;  Surgeon: Lorretta Harp, MD;  Location: [place] [facility];  Service: Cardiovascular;  Laterality: Bilateral;  . ABDOMINAL AORTOGRAM W/LOWER EXTREMITY Bilateral 08/03/2019   Procedure: ABDOMINAL AORTOGRAM W/LOWER EXTREMITY;  Surgeon: Lorretta Harp, MD;  Location: [place] [facility];  Service: Cardiovascular;  Laterality: Bilateral;  . ABDOMINAL HYSTERECTOMY  1982  . CARPAL TUNNEL RELEASE Left 2005  . foot spur Left 2007   foot  . PERIPHERAL VASCULAR BALLOON ANGIOPLASTY Left 08/03/2019   Procedure: PERIPHERAL VASCULAR BALLOON ANGIOPLASTY;  Surgeon: Lorretta Harp, MD;  Location: [place] [facility];  Service: Cardiovascular;  Laterality: Left;  SFA/ popliteal     Current Meds  Medication Sig  . aspirin EC 81 MG tablet Take 81 mg by mouth daily.  . carvedilol (COREG) 25 MG tablet Take 1 tablet (25 mg total) by mouth 2 (two) times daily.  . cilostazol (PLETAL) 50 MG tablet Take 1 tablet (50 mg total) by mouth 2 (two) times daily.  . clopidogrel (PLAVIX) 75 MG tablet Take 1 tablet (75 mg total) by mouth daily with breakfast.  . cyclobenzaprine (FLEXERIL) 10 MG tablet Take 1 tablet (10 mg total) by mouth at bedtime as needed. (Patient taking differently: Take 10 mg by mouth at bedtime. )  . ezetimibe (ZETIA) 10 MG tablet TAKE 1 TABLET(10 MG) BY MOUTH DAILY (Patient taking differently: Take 10 mg by mouth daily. )  . Fish Oil-Cholecalciferol (FISH OIL + D3) 1200-1000 MG-UNIT CAPS Take 1 capsule by mouth daily.  Marland Kitchen ibuprofen (ADVIL) 200 MG tablet Take 400 mg by mouth at bedtime.  Marland Kitchen losartan (COZAAR) 100 MG tablet TAKE 1 TABLET BY MOUTH DAILY (Patient taking differently: Take 100 mg by mouth daily. )  . MAGNESIUM CITRATE PO Take 480 mg by mouth 2 (two) times a day.  . Melatonin 12 MG TABS Take 12 mg by mouth at bedtime.  . Multiple Vitamin (MULTIVITAMIN WITH MINERALS) TABS tablet Take 1  tablet by mouth every evening. Women's One-A-Day  . pantoprazole (PROTONIX) 40 MG tablet Take 1 tablet (40 mg total) by mouth daily.  . rosuvastatin (CRESTOR) 5 MG tablet Take 5 mg by mouth daily.   Current Facility-Administered Medications for the 08/19/19 encounter (Telemedicine) with Lorretta Harp, MD  Medication  . sodium chloride flush (NS) 0.9 % injection 3 mL     Allergies:   Amlodipine   Social History   Tobacco Use  . Smoking status: Current Every Day Smoker    Packs/day: 0.50    Types: Cigarettes  . Smokeless tobacco: Former Systems developer  . Tobacco comment: about half a pack per day  Substance Use Topics  . Alcohol use: Yes    Comment: rare  . Drug use: No     Family Hx: The patient's family history is negative for Colon cancer, Esophageal cancer, Rectal cancer, and Stomach cancer.  ROS:   Please see the history of present illness.     All other systems reviewed and are negative.   Prior CV studies:   The following studies were reviewed today:  Lower extremity arterial Doppler studies performed 08/10/2019  Labs/Other Tests  and Data Reviewed:    EKG:  No ECG reviewed.  Recent Labs: 03/04/2019: Magnesium 2.0 03/20/2019: TSH 1.370 06/18/2019: ALT 20 08/04/2019: BUN 8; Creatinine, Ser 0.67; Hemoglobin 12.6; Platelets 263; Potassium 3.6; Sodium 141   Recent Lipid Panel Lab Results  Component Value Date/Time   CHOL 153 06/08/2019 10:00 AM   TRIG 87 06/08/2019 10:00 AM   HDL 64 06/08/2019 10:00 AM   CHOLHDL 2.4 06/08/2019 10:00 AM   CHOLHDL 4.5 03/22/2017 11:16 AM   LDLCALC 73 06/08/2019 10:00 AM    Wt Readings from Last 3 Encounters:  08/19/19 150 lb 6.4 oz (68.2 kg)  08/04/19 152 lb 12.8 oz (69.3 kg)  07/17/19 152 lb 9.6 oz (69.2 kg)     Objective:    Vital Signs:  Ht 5\' 7"  (1.702 m)   Wt 150 lb 6.4 oz (68.2 kg)   BMI 23.56 kg/m    VITAL SIGNS:  reviewed a complete physical exam was not performed today since this is a virtual telemedicine phone  visit  ASSESSMENT & PLAN:    1. Peripheral arterial disease-history of symptomatic PAD status post angiography performed 03/26/2019 revealing occluded right popliteal artery beginning above the knee and extending into the 3 tibial vessels.  Did not think she had a surgical or endovascular option for this.  She did have 90% mid SFA stenosis as well as 95% calcified left popliteal artery stenosis.  I performed diamondback orbital atherectomy, drug-coated balloon angioplasty on these lesions 08/03/2019 with an excellent result using distal protection.  Her follow-up Dopplers performed 08/10/2019 showed marked improvement in her velocities.  Her claudication has resolved on that side.  She does have some medial right thigh ecchymosis which I suspect will resolve.  She is on aspirin and Plavix.  COVID-19 Education: The signs and symptoms of COVID-19 were discussed with the patient and how to seek care for testing (follow up with PCP or arrange E-visit).  The importance of social distancing was discussed today.  Time:   Today, I have spent 5 minutes with the patient with telehealth technology discussing the above problems.     Medication Adjustments/Labs and Tests Ordered: Current medicines are reviewed at length with the patient today.  Concerns regarding medicines are outlined above.   Tests Ordered: No orders of the defined types were placed in this encounter.   Medication Changes: No orders of the defined types were placed in this encounter.   Follow Up:  In Person in 6 month(s)  Signed, Quay Burow, MD  08/19/2019 10:45 AM    [place]

## 2019-08-19 NOTE — Patient Instructions (Signed)
Medication Instructions:  Your physician recommends that you continue on your current medications as directed. Please refer to the Current Medication list given to you today.  If you need a refill on your cardiac medications before your next appointment, please call your pharmacy.   Lab work: NONE  Testing/Procedures: IN 6 MONTHS Your physician has requested that you have a lower extremity arterial exercise duplex. During this test, exercise and ultrasound are used to evaluate arterial blood flow in the legs. Allow one hour for this exam. There are no restrictions or special instructions.  AND  Your physician has requested that you have an ankle brachial index (ABI). During this test an ultrasound and blood pressure cuff are used to evaluate the arteries that supply the arms and legs with blood. Allow thirty minutes for this exam. There are no restrictions or special instructions.  Follow-Up: At [facility], you and your health needs are our priority.  As part of our continuing mission to provide you with exceptional heart care, we have created designated Provider Care Teams.  These Care Teams include your primary Cardiologist (physician) and Advanced Practice Providers (APPs -  Physician Assistants and Nurse Practitioners) who all work together to provide you with the care you need, when you need it. You may see Dr Gwenlyn Found or one of the following Advanced Practice Providers on your designated Care Team:    Kerin Ransom, PA-C  [place], [place]  Coletta Memos, [place]  Your physician wants you to follow-up in: [place]

## 2019-08-20 ENCOUNTER — Telehealth: Payer: Self-pay | Admitting: Cardiology

## 2019-08-20 NOTE — Telephone Encounter (Signed)
Patient states that she was a patient of Dr. Radford Pax who referred her to see Dr. Gwenlyn Found. Dr. Gwenlyn Found is urging her to quit smoking so she is ready to have chantix ordered, which Dr. Radford Pax recommended when she was a patient, but she could not afford the prescription. She states that she filled out a patient assistance application before the pandemic hit and left it for Dr. Radford Pax. She is now wanting that application sent to Dr. Gwenlyn Found so she can get approved for the medication.

## 2019-08-24 MED ORDER — VARENICLINE TARTRATE 1 MG PO TABS: 1.0000 mg | ORAL_TABLET | Freq: Two times a day (BID) | ORAL | 3 refills | Status: DC

## 2019-08-24 MED ORDER — CHANTIX STARTING MONTH PAK 0.5 MG X 11 & 1 MG X 42 PO TABS: ORAL_TABLET | ORAL | 0 refills | Status: DC

## 2019-08-24 NOTE — Telephone Encounter (Addendum)
**Note De-Identified  Obfuscation** Please refer to phone note from 12/09/2018. I am unaware of and did not receive an application. I am not familiar with pt asst for Chantix. Thanks!

## 2019-08-24 NOTE — Telephone Encounter (Signed)
We did not submit any paperwork for chantix patient assistance. Looks like patient has medicaid and a medicare part D plan on file at pharmacy. Per Pamala Hurry when I called the Rx would cost $3.90.  Called patient to discuss- awaiting a call back.

## 2019-08-24 NOTE — Telephone Encounter (Signed)
Called patient and let her know that chantix is $3.90/month with her insurance. She states she can afford this. She is ready to quite. I have educated her on using the starter pack. Start chanitx 1-2 weeks before her quite date. Continue chantix for atleast 12 weeks- I have provided her refills if she chooses to go beyond 12 weeks. Patient appreciative of the help.

## 2019-09-22 ENCOUNTER — Other Ambulatory Visit: Payer: Self-pay | Admitting: Cardiology

## 2019-09-25 ENCOUNTER — Telehealth: Payer: Self-pay | Admitting: Family Medicine

## 2019-09-25 ENCOUNTER — Other Ambulatory Visit: Payer: Self-pay | Admitting: Family Medicine

## 2019-09-25 DIAGNOSIS — R1030 Lower abdominal pain, unspecified: Secondary | ICD-10-CM

## 2019-09-25 DIAGNOSIS — K5792 Diverticulitis of intestine, part unspecified, without perforation or abscess without bleeding: Secondary | ICD-10-CM

## 2019-09-25 MED ORDER — ACETAMINOPHEN-CODEINE #3 300-30 MG PO TABS: 1.0000 | ORAL_TABLET | Freq: Four times a day (QID) | ORAL | 0 refills | Status: DC | PRN

## 2019-09-25 MED ORDER — METRONIDAZOLE 500 MG PO TABS: 500.0000 mg | ORAL_TABLET | Freq: Two times a day (BID) | ORAL | 0 refills | Status: DC

## 2019-09-25 NOTE — Progress Notes (Signed)
Margaret Kelly, a 69 year old female with a medical history significant for hypertension, , chronic back pain, PAD, hepatitis C, hyperlipidemia, and diverticulitis called with complaints of abdominal pain that is consistent with previous diverticulitis flare. She characterized lower abdominal pain as constant and cramping.   Patient will follow up with clinic on 09/28/2019. The following medications have been sent to pharmacy.    Diverticulitis - metroNIDAZOLE (FLAGYL) 500 MG tablet; Take 1 tablet (500 mg total) by mouth 2 (two) times daily.  Dispense: 14 tablet; Refill: 0 - acetaminophen-codeine (TYLENOL #3) 300-30 MG tablet; Take 1 tablet by mouth every 6 (six) hours as needed for moderate pain.  Dispense: 20 tablet; Refill: 0  Lower abdominal pain - metroNIDAZOLE (FLAGYL) 500 MG tablet; Take 1 tablet (500 mg total) by mouth 2 (two) times daily.  Dispense: 14 tablet; Refill: 0 - acetaminophen-codeine (TYLENOL #3) 300-30 MG tablet; Take 1 tablet by mouth every 6 (six) hours as needed for moderate pain.  Dispense: 20 tablet; Refill: 0   Margaret Pounds  APRN, MSN, FNP-C [facility] [address] [place], [place] [address] [phone]

## 2019-09-25 NOTE — Telephone Encounter (Signed)
Patient called and is asking if there is anything she can take otc or by prescription to help with diverticulitis pain. She was seen in er x 2 weeks ago for this. She is stating she is having episodes off an on but the pain is very harsh. She is scheduled with you on 10/06/2019 and I suggested she come in and establish with Crystal next week for a sooner appointment. She did not want to do that, she wanted to wait and be seen with you. Please advise.

## 2019-09-28 ENCOUNTER — Telehealth: Payer: Self-pay | Admitting: Family Medicine

## 2019-09-28 NOTE — Telephone Encounter (Signed)
Called and spoke with patient. She states she feels much better and will keep appointment on 10/06/19. Thanks!

## 2019-10-06 ENCOUNTER — Other Ambulatory Visit: Payer: Self-pay

## 2019-10-06 ENCOUNTER — Ambulatory Visit (INDEPENDENT_AMBULATORY_CARE_PROVIDER_SITE_OTHER): Payer: Medicare Other | Admitting: Family Medicine

## 2019-10-06 ENCOUNTER — Encounter: Payer: Self-pay | Admitting: Family Medicine

## 2019-10-06 VITALS — BP 158/82 | HR 71 | Temp 97.7°F | Resp 16 | Ht 66.0 in | Wt 155.0 lb

## 2019-10-06 DIAGNOSIS — K580 Irritable bowel syndrome with diarrhea: Secondary | ICD-10-CM

## 2019-10-06 DIAGNOSIS — E785 Hyperlipidemia, unspecified: Secondary | ICD-10-CM

## 2019-10-06 DIAGNOSIS — I10 Essential (primary) hypertension: Secondary | ICD-10-CM

## 2019-10-06 LAB — POCT URINALYSIS DIPSTICK
Bilirubin, UA: NEGATIVE
Glucose, UA: NEGATIVE
Ketones, UA: NEGATIVE
Leukocytes, UA: NEGATIVE
Nitrite, UA: NEGATIVE
Protein, UA: NEGATIVE
Spec Grav, UA: 1.025 (ref 1.010–1.025)
Urobilinogen, UA: 0.2 E.U./dL
pH, UA: 5.5 (ref 5.0–8.0)

## 2019-10-06 NOTE — Patient Instructions (Signed)
Preventing High Cholesterol Cholesterol is a white, waxy substance similar to fat that the human body needs to help build cells. The liver makes all the cholesterol that a person's body needs. Having high cholesterol (hypercholesterolemia) increases a person's risk for heart disease and stroke. Extra (excess) cholesterol comes from the food the person eats. High cholesterol can often be prevented with diet and lifestyle changes. If you already have high cholesterol, you can control it with diet and lifestyle changes and with medicine. How can high cholesterol affect me? If you have high cholesterol, deposits (plaques) may build up on the walls of your arteries. The arteries are the blood vessels that carry blood away from your heart. Plaques make the arteries narrower and stiffer. This can limit or block blood flow and cause blood clots to form. Blood clots:  Are tiny balls of cells that form in your blood.  Can move to the heart or brain, causing a heart attack or stroke. Plaques in arteries greatly increase your risk for heart attack and stroke.Making diet and lifestyle changes can reduce your risk for these conditions that may threaten your life. What can increase my risk? This condition is more likely to develop in people who:  Eat foods that are high in saturated fat or cholesterol. Saturated fat is mostly found in: ? Foods that contain animal fat, such as red meat and some dairy products. ? Certain fatty foods made from plants, such as tropical oils.  Are overweight.  Are not getting enough exercise.  Have a family history of high cholesterol. What actions can I take to prevent this? Nutrition   Eat less saturated fat.  Avoid trans fats (partially hydrogenated oils). These are often found in margarine and in some baked goods, fried foods, and snacks bought in packages.  Avoid precooked or cured meat, such as sausages or meat loaves.  Avoid foods and drinks that have added  sugars.  Eat more fruits, vegetables, and whole grains.  Choose healthy sources of protein, such as fish, poultry, lean cuts of red meat, beans, peas, lentils, and nuts.  Choose healthy sources of fat, such as: ? Nuts. ? Vegetable oils, especially olive oil. ? Fish that have healthy fats (omega-3 fatty acids), such as mackerel or salmon. The items listed above may not be a complete list of recommended foods and beverages. Contact a dietitian for more information. Lifestyle  Lose weight if you are overweight. Losing 5-10 lb (2.3-4.5 kg) can help prevent or control high cholesterol. It can also lower your risk for diabetes and high blood pressure. Ask your health care provider to help you with a diet and exercise plan to lose weight safely.  Do not use any products that contain nicotine or tobacco, such as cigarettes, e-cigarettes, and chewing tobacco. If you need help quitting, ask your health care provider.  Limit your alcohol intake. ? Do not drink alcohol if:  Your health care provider tells you not to drink.  You are pregnant, may be pregnant, or are planning to become pregnant. ? If you drink alcohol:  Limit how much you use to:  0-1 drink a day for women.  0-2 drinks a day for men.  Be aware of how much alcohol is in your drink. In the [place] one drink equals one 12 oz bottle of beer (355 mL), one 5 oz glass of wine (148 mL), or one 1 oz glass of hard liquor (44 mL). Activity   Get enough exercise. Each week, do at   least 150 minutes of exercise that takes a medium level of effort (moderate-intensity exercise). ? This is exercise that:  Makes your heart beat faster and makes you breathe harder than usual.  Allows you to still be able to talk. ? You could exercise in short sessions several times a day or longer sessions a few times a week. For example, on 5 days each week, you could walk fast or ride your bike 3 times a day for 10 minutes each time.  Do exercises as told  by your health care provider. Medicines  In addition to diet and lifestyle changes, your health care provider may recommend medicines to help lower cholesterol. This may be a medicine to lower the amount of cholesterol your liver makes. You may need medicine if: ? Diet and lifestyle changes do not lower your cholesterol enough. ? You have high cholesterol and other risk factors for heart disease or stroke.  Take over-the-counter and prescription medicines only as told by your health care provider. General information  Manage your risk factors for high cholesterol. Talk with your health care provider about all your risk factors and how to lower your risk.  Manage other conditions that you have, such as diabetes or high blood pressure (hypertension).  Have blood tests to check your cholesterol levels at regular points in time as told by your health care provider.  Keep all follow-up visits as told by your health care provider. This is important. Where to find more information  American Heart Association: www.heart.org  National Heart, Lung, and Blood Institute: www.nhlbi.nih.gov Summary  High cholesterol increases your risk for heart disease and stroke. By keeping your cholesterol level low, you can reduce your risk for these conditions.  High cholesterol can often be prevented with diet and lifestyle changes.  Work with your health care provider to manage your risk factors, and have your blood tested regularly. This information is not intended to replace advice given to you by your health care provider. Make sure you discuss any questions you have with your health care provider. Document Revised: 12/12/2018 Document Reviewed: 04/28/2016 Elsevier Patient Education  2020 Elsevier Inc.  

## 2019-10-06 NOTE — Progress Notes (Signed)
Patient [facility]    Subjective:  Patient ID: Margaret Kelly, female    DOB: [birth year]  Age: 69 y.o. MRN: [place]:[id number]  CC:  Chief Complaint  Patient presents with  . Hypertension    sees cardiology   . Hyperlipidemia  . Follow-up    abdominal pain   says that pain is improved and she is without complaint on today. Margaret Kelly a 69 year old female with a medical history significant for hypertension, hyperlipidemia, history of PAD on Plavix, chronic tobacco use presents for follow-up of abdominal pain.  Patient had a telephone encounter on 09/28/2019 complaining of right lower quadrant abdominal pain over the past several weeks.  Patient was noted to have a history of hemorrhoids, rectal prolapse, and diverticulosis.  Patient had a previous CT of the abdomen and pelvis that showed some circumferential thickening of the sigmoid colon with pericolonic inflammation centered on the culprit diverticulum in the left lower quadrant, consistent with acute diverticulitis.  No evidence of perforation or abscess formation.  There was an indeterminate 12 mm nodule in the bottom of the left adrenal gland, which was an incidental finding.  A trial of metronidazole was initiated.  Also, Tylenol 3 for pain control.  Patient   Hypertension This is a chronic problem. The problem is controlled. Pertinent negatives include no anxiety, blurred vision, chest pain, headaches, malaise/fatigue, neck pain, orthopnea, palpitations, peripheral edema or shortness of breath. Risk factors for coronary artery disease include dyslipidemia and smoking/tobacco exposure. There are no compliance problems.   Hyperlipidemia This is a chronic problem. The problem is controlled. Recent lipid tests were reviewed and are normal. She has no history of diabetes or obesity. Pertinent negatives include no chest pain or shortness of breath. The current treatment provides no improvement of lipids.  There are no compliance problems.     Past Medical History:  Diagnosis Date  . Anxiety   . Blood transfusion without reported diagnosis 1982  . GERD (gastroesophageal reflux disease)   . History of hepatitis C 10/13/2015  . Hyperlipidemia LDL goal <100 10/13/2015  . Hypertension   . Muscle spasm of left lower extremity 10/13/2015  . Nerve damage 2001   left side as result of fall  . PAD (peripheral artery disease) ([place])   . Pain of left hip joint 02/21/2018  . Plantar wart     Past Surgical History:  Procedure Laterality Date  . ABDOMINAL AORTOGRAM W/LOWER EXTREMITY Bilateral 03/26/2019   Procedure: ABDOMINAL AORTOGRAM W/LOWER EXTREMITY;  Surgeon: Lorretta Harp, MD;  Location: [facility];  Service: Cardiovascular;  Laterality: Bilateral;  . ABDOMINAL AORTOGRAM W/LOWER EXTREMITY Bilateral 08/03/2019   Procedure: ABDOMINAL AORTOGRAM W/LOWER EXTREMITY;  Surgeon: Lorretta Harp, MD;  Location: [facility];  Service: Cardiovascular;  Laterality: Bilateral;  . ABDOMINAL HYSTERECTOMY  1982  . CARPAL TUNNEL RELEASE Left 2005  . foot spur Left 2007   foot  . PERIPHERAL VASCULAR BALLOON ANGIOPLASTY Left 08/03/2019   Procedure: PERIPHERAL VASCULAR BALLOON ANGIOPLASTY;  Surgeon: Lorretta Harp, MD;  Location: [facility];  Service: Cardiovascular;  Laterality: Left;  SFA/ popliteal    Family History  Problem Relation Age of Onset  . Colon cancer Neg Hx   . Esophageal cancer Neg Hx   . Rectal cancer Neg Hx   . Stomach cancer Neg Hx     Social History   Socioeconomic History  . Marital status: Divorced  Spouse name: Not on file  . Number of children: Not on file  . Years of education: Not on file  . Highest education level: Not on file  Occupational History  . Not on file  Tobacco Use  . Smoking status: Current Every Day Smoker    Packs/day: 0.50    Types: Cigarettes  . Smokeless tobacco: Former Systems developer  . Tobacco comment: about half a pack per day   Substance and Sexual Activity  . Alcohol use: Yes    Comment: rare  . Drug use: No  . Sexual activity: Not on file  Other Topics Concern  . Not on file  Social History Narrative  . Not on file   Social Determinants of Health   Financial Resource Strain:   . Difficulty of Paying Living Expenses: Not on file  Food Insecurity:   . Worried About Charity fundraiser in the Last Year: Not on file  . Ran Out of Food in the Last Year: Not on file  Transportation Needs:   . Lack of Transportation (Medical): Not on file  . Lack of Transportation (Non-Medical): Not on file  Physical Activity:   . Days of Exercise per Week: Not on file  . Minutes of Exercise per Session: Not on file  Stress:   . Feeling of Stress : Not on file  Social Connections:   . Frequency of Communication with Friends and Family: Not on file  . Frequency of Social Gatherings with Friends and Family: Not on file  . Attends Religious Services: Not on file  . Active Member of Clubs or Organizations: Not on file  . Attends Archivist Meetings: Not on file  . Marital Status: Not on file  Intimate Partner Violence:   . Fear of Current or Ex-Partner: Not on file  . Emotionally Abused: Not on file  . Physically Abused: Not on file  . Sexually Abused: Not on file    Outpatient Medications Prior to Visit  Medication Sig Dispense Refill  . aspirin EC 81 MG tablet Take 81 mg by mouth daily.    . carvedilol (COREG) 25 MG tablet TAKE 1 TABLET(25 MG) BY MOUTH TWICE DAILY 180 tablet 3  . cilostazol (PLETAL) 50 MG tablet Take 1 tablet (50 mg total) by mouth 2 (two) times daily. 90 tablet 3  . clopidogrel (PLAVIX) 75 MG tablet Take 1 tablet (75 mg total) by mouth daily with breakfast. 30 tablet 11  . cyclobenzaprine (FLEXERIL) 10 MG tablet Take 1 tablet (10 mg total) by mouth at bedtime as needed. (Patient taking differently: Take 10 mg by mouth at bedtime. ) 90 tablet 0  . ezetimibe (ZETIA) 10 MG tablet TAKE 1  TABLET(10 MG) BY MOUTH DAILY (Patient taking differently: Take 10 mg by mouth daily. ) 90 tablet 2  . Fish Oil-Cholecalciferol (FISH OIL + D3) 1200-1000 MG-UNIT CAPS Take 1 capsule by mouth daily.    Marland Kitchen ibuprofen (ADVIL) 200 MG tablet Take 400 mg by mouth at bedtime.    Marland Kitchen losartan (COZAAR) 100 MG tablet TAKE 1 TABLET BY MOUTH DAILY (Patient taking differently: Take 100 mg by mouth daily. ) 90 tablet 3  . MAGNESIUM CITRATE PO Take 480 mg by mouth 2 (two) times a day.    . Melatonin 12 MG TABS Take 12 mg by mouth at bedtime.    . Multiple Vitamin (MULTIVITAMIN WITH MINERALS) TABS tablet Take 1 tablet by mouth every evening. Women's One-A-Day    . pantoprazole (PROTONIX)  40 MG tablet Take 1 tablet (40 mg total) by mouth daily. 30 tablet 3  . rosuvastatin (CRESTOR) 5 MG tablet Take 5 mg by mouth daily.    . varenicline (CHANTIX STARTING MONTH PAK) 0.5 MG X 11 & 1 MG X 42 tablet Take one 0.5 mg tablet by mouth once daily for 3 days, then increase to one 0.5 mg tablet twice daily for 4 days, then increase to one 1 mg tablet twice daily. 53 tablet 0  . acetaminophen-codeine (TYLENOL #3) 300-30 MG tablet Take 1 tablet by mouth every 6 (six) hours as needed for moderate pain. (Patient not taking: Reported on 10/06/2019) 20 tablet 0  . varenicline (CHANTIX CONTINUING MONTH PAK) 1 MG tablet Take 1 tablet (1 mg total) by mouth 2 (two) times daily. (Patient not taking: Reported on 10/06/2019) 60 tablet 3  . metroNIDAZOLE (FLAGYL) 500 MG tablet Take 1 tablet (500 mg total) by mouth 2 (two) times daily. 14 tablet 0   Facility-Administered Medications Prior to Visit  Medication Dose Route Frequency Provider Last Rate Last Admin  . sodium chloride flush (NS) 0.9 % injection 3 mL  3 mL Intravenous Q12H Lorretta Harp, MD        Allergies  Allergen Reactions  . Amlodipine Other (See Comments)    Ringing in ears    ROS Review of Systems  Constitutional: Negative for malaise/fatigue.  Eyes: Negative for blurred  vision.  Respiratory: Negative for shortness of breath.   Cardiovascular: Negative for chest pain, palpitations and orthopnea.  Musculoskeletal: Negative for neck pain.  Neurological: Negative for headaches.      Objective:    Physical Exam  BP (!) 158/82   Pulse 71   Temp 97.7 F (36.5 C) (Oral)   Resp 16   Ht 5\' 6"  (1.676 m)   Wt 155 lb (70.3 kg)   SpO2 98%   BMI 25.02 kg/m  Wt Readings from Last 3 Encounters:  10/06/19 155 lb (70.3 kg)  08/19/19 150 lb 6.4 oz (68.2 kg)  08/04/19 152 lb 12.8 oz (69.3 kg)     Health Maintenance Due  Topic Date Due  . DEXA SCAN  05/22/2018  . PNA vac Low Risk Adult (1 of 2 - PCV13) 05/22/2018    There are no preventive care reminders to display for this patient.  Lab Results  Component Value Date   TSH 1.370 03/20/2019   Lab Results  Component Value Date   WBC 9.3 08/04/2019   HGB 12.6 08/04/2019   HCT 37.3 08/04/2019   MCV 94.9 08/04/2019   PLT 263 08/04/2019   Lab Results  Component Value Date   NA 141 08/04/2019   K 3.6 08/04/2019   CO2 24 08/04/2019   GLUCOSE 99 08/04/2019   BUN 8 08/04/2019   CREATININE 0.67 08/04/2019   BILITOT 0.6 06/18/2019   ALKPHOS 64 06/18/2019   AST 21 06/18/2019   ALT 20 06/18/2019   PROT 7.8 06/18/2019   ALBUMIN 4.3 06/18/2019   CALCIUM 8.8 (L) 08/04/2019   ANIONGAP 9 08/04/2019   Lab Results  Component Value Date   CHOL 153 06/08/2019   Lab Results  Component Value Date   HDL 64 06/08/2019   Lab Results  Component Value Date   LDLCALC 73 06/08/2019   Lab Results  Component Value Date   TRIG 87 06/08/2019   Lab Results  Component Value Date   CHOLHDL 2.4 06/08/2019   No results found for: HGBA1C  Assessment & Plan:   Problem List Items Addressed This Visit      Cardiovascular and Mediastinum   Essential hypertension - Primary   Relevant Orders   Urinalysis Dipstick (Completed)     Essential hypertension Continue medication, monitor blood pressure at  home. Continue DASH diet. Reminder to go to the ER if any CP, SOB, nausea, dizziness, severe HA, changes vision/speech, left arm numbness and tingling and jaw pain.   - Urinalysis Dipstick - Basic Metabolic Panel   Irritable bowel syndrome with diarrhea Patient has ongoing intermittent abdominal discomfort.  Pain is primarily localized to right upper and lower quadrant.  However, patient has diverticulitis that was identified on CT scan.  She also has a history of IBS with diarrhea.  Will defer to gastroenterology for further work-up and evaluation of abdominal issues.  Patient says that she is always had a sensitive stomach. Recommend a low-fat diet and increase activity level.  No medications warranted at this time. - Ambulatory referral to Gastroenterology   Hyperlipidemia LDL goal <100 The 10-year ASCVD risk score [name] DC Brooke Bonito., et al., 2013) is: 16.8%   Values used to calculate the score:     Age: 56 years     Sex: Female     Is Non-Hispanic African American: No     Diabetic: No     Tobacco smoker: Yes     Systolic Blood Pressure: 0000000 mmHg     Is BP treated: Yes     HDL Cholesterol: 64 mg/dL     Total Cholesterol: 153 mg/dL  Follow-up: Return in about 3 months (around 01/03/2020) for hypertension, hyperlipidemia.    [name]  APRN, MSN, FNP-C Patient [facility] [address] [place], [place] [address] [phone]

## 2019-10-07 LAB — BASIC METABOLIC PANEL
BUN/Creatinine Ratio: 26 (ref 12–28)
BUN: 16 mg/dL (ref 8–27)
CO2: 22 mmol/L (ref 20–29)
Calcium: 9.6 mg/dL (ref 8.7–10.3)
Chloride: 105 mmol/L (ref 96–106)
Creatinine, Ser: 0.61 mg/dL (ref 0.57–1.00)
GFR calc Af Amer: 109 mL/min/{1.73_m2} (ref >59)
GFR calc non Af Amer: 95 mL/min/{1.73_m2} (ref >59)
Glucose: 134 mg/dL — ABNORMAL HIGH (ref 65–99)
Potassium: 4.4 mmol/L (ref 3.5–5.2)
Sodium: 139 mmol/L (ref 134–144)

## 2019-10-09 ENCOUNTER — Other Ambulatory Visit: Payer: Self-pay

## 2019-10-09 ENCOUNTER — Telehealth: Payer: Self-pay

## 2019-10-09 ENCOUNTER — Encounter: Payer: Self-pay | Admitting: Gastroenterology

## 2019-10-09 ENCOUNTER — Ambulatory Visit (INDEPENDENT_AMBULATORY_CARE_PROVIDER_SITE_OTHER): Payer: Medicare Other | Admitting: Gastroenterology

## 2019-10-09 VITALS — BP 152/80 | HR 78 | Temp 97.7°F | Ht 66.0 in | Wt 153.0 lb

## 2019-10-09 DIAGNOSIS — Z7901 Long term (current) use of anticoagulants: Secondary | ICD-10-CM | POA: Diagnosis not present

## 2019-10-09 DIAGNOSIS — Z01818 Encounter for other preprocedural examination: Secondary | ICD-10-CM | POA: Diagnosis not present

## 2019-10-09 DIAGNOSIS — R9389 Abnormal findings on diagnostic imaging of other specified body structures: Secondary | ICD-10-CM | POA: Diagnosis not present

## 2019-10-09 DIAGNOSIS — K5792 Diverticulitis of intestine, part unspecified, without perforation or abscess without bleeding: Secondary | ICD-10-CM | POA: Diagnosis not present

## 2019-10-09 MED ORDER — NA SULFATE-K SULFATE-MG SULF 17.5-3.13-1.6 GM/177ML PO SOLN: 1.0000 | Freq: Once | ORAL | 0 refills | Status: AC

## 2019-10-09 MED ORDER — HYOSCYAMINE SULFATE 0.125 MG SL SUBL: 0.1250 mg | SUBLINGUAL_TABLET | Freq: Four times a day (QID) | SUBLINGUAL | 2 refills | Status: DC | PRN

## 2019-10-09 NOTE — Telephone Encounter (Signed)
Dr Gwenlyn Found, You had a televisit w/ Ms Ignacio on 08/19/2019.   From your note:  1. Peripheral arterial disease-history of symptomatic PAD status post angiography performed 03/26/2019 revealing occluded right popliteal artery beginning above the knee and extending into the 3 tibial vessels.  Did not think she had a surgical or endovascular option for this.  She did have 90% mid SFA stenosis as well as 95% calcified left popliteal artery stenosis.  I performed diamondback orbital atherectomy, drug-coated balloon angioplasty on these lesions 08/03/2019 with an excellent result using distal protection.  Her follow-up Dopplers performed 08/10/2019 showed marked improvement in her velocities.  Her claudication has resolved on that side.  She does have some medial right thigh ecchymosis which I suspect will resolve.  She is on aspirin and Plavix.  She has a colonoscopy scheduled for 03/09. They want to hold the Plavix x 5 days and Pletal for 2 days.   Please advise.   Rosaria Ferries, PA-C 10/09/2019 2:39 PM

## 2019-10-09 NOTE — Patient Instructions (Addendum)
You have been scheduled for a colonoscopy. Please follow written instructions given to you at your visit today.  Please pick up your prep supplies at the pharmacy within the next 1-3 days. If you use inhalers (even only as needed), please bring them with you on the day of your procedure.  If you are age 67 or older, your body mass index should be between 23-30. Your Body mass index is 24.69 kg/m. If this is out of the aforementioned range listed, please consider follow up with your Primary Care Provider.  If you are age 28 or younger, your body mass index should be between 19-25. Your Body mass index is 24.69 kg/m. If this is out of the aformentioned range listed, please consider follow up with your Primary Care Provider.    We have sent the following medications to your pharmacy for you to pick up at your convenience: Suprep and Levsin  Please call us with any diverticulitis flair in the interim.    Thank you for choosing to see me today and [facility].  Alonza Bogus, PA

## 2019-10-09 NOTE — Telephone Encounter (Signed)
Request for surgical clearance:     Endoscopy Procedure  What type of surgery is being performed?     Colonscopy  When is this surgery scheduled?     11/10/19  What type of clearance is required ?   Pharmacy  Are there any medications that need to be held prior to surgery and how long? Plavix for 5 days prior to procedure, and Pletal for 2 days prior to procedure.  Practice name and name of physician performing surgery?      [facility]  What is your office phone and fax number?      Phone- [phone]  [phone]  Anesthesia type (None, local, MAC, general) ?       MAC

## 2019-10-09 NOTE — Progress Notes (Signed)
10/09/2019 Margaret Kelly [place]:[id number] 06-Dec-1950   HISTORY OF PRESENT ILLNESS: This is a 69 year old female who is previously known to Dr. Deatra Ina.  Her last colonoscopy was in February 2015 at which time she was found to have one 10 mm hyperplastic polyp that was removed.  Also noted to have internal hemorrhoids, rectal prolapse, diverticulosis.  She presents to our office today with complaints of lower abdominal pain.  This has been episodic since September.  In September she had a CT scan of the abdomen pelvis with contrast that showed the following:   IMPRESSION: Circumferential thickening of the sigmoid colon with pericolonic inflammation centered on a culprit diverticulum in the left lower quadrant, consistent with acute diverticulitis. No evidence of perforation or abscess formation.  Indeterminate 12 mm nodule in the body of the left adrenal gland. In the absence of prior malignancy is statistically likely benign though consider follow-up adrenal CT in 12 months time.  Aortic Atherosclerosis (ICD10-I70.0).  Was treated with antibiotics and symptoms resolved.  Then she had another episode in October for which she then again underwent CT scan of the abdomen pelvis that showed the following:   IMPRESSION: 1. Negative for acute aortic dissection or aneurysm. Aortic atherosclerosis without acute occlusive disease. 2. Mild focal inflammatory change in the left lower quadrant near the distal descending/proximal sigmoid colon with mild colon wall thickening. Findings could be secondary to acute diverticulitis. Epiploic appendagitis could also be considered. No perforation or Abscess.  Was again treated with antibiotics and symptoms resolved.  She recently had another episode, no CT scan was performed, but she was treated with antibiotics.  She says she completed the antibiotics about a week ago and is feeling better.  She says that even though CT scans always show diverticulitis  on the left her pain is always on the right side.  She says when the pain comes it is extremely severe like cramping or "birth" pain.  She says that when this occurs she just keeps taking Tylenol and lays in bed for 2 days crying because of the pain until antibiotics start to work.  She says that she has always had a very sensitive stomach.  Over the past several months she tends to have diarrhea, which she attributes to all the new medications that she is taking.  She is on Plavix and Pletal for peripheral arterial disease.  Sees Dr. Radford Pax and Dr. Alvester Chou.  Past Medical History:  Diagnosis Date  . Anxiety   . Blood transfusion without reported diagnosis 1982  . GERD (gastroesophageal reflux disease)   . History of hepatitis C 10/13/2015  . Hyperlipidemia LDL goal <100 10/13/2015  . Hypertension   . Muscle spasm of left lower extremity 10/13/2015  . Nerve damage 2001   left side as result of fall  . PAD (peripheral artery disease) ([place])   . Pain of left hip joint 02/21/2018  . Plantar wart    Past Surgical History:  Procedure Laterality Date  . ABDOMINAL AORTOGRAM W/LOWER EXTREMITY Bilateral 03/26/2019   Procedure: ABDOMINAL AORTOGRAM W/LOWER EXTREMITY;  Surgeon: Lorretta Harp, MD;  Location: [facility];  Service: Cardiovascular;  Laterality: Bilateral;  . ABDOMINAL AORTOGRAM W/LOWER EXTREMITY Bilateral 08/03/2019   Procedure: ABDOMINAL AORTOGRAM W/LOWER EXTREMITY;  Surgeon: Lorretta Harp, MD;  Location: [facility];  Service: Cardiovascular;  Laterality: Bilateral;  . ABDOMINAL HYSTERECTOMY  1982  . CARPAL TUNNEL RELEASE Left 2005  . foot spur Left 2007  foot  . PERIPHERAL VASCULAR BALLOON ANGIOPLASTY Left 08/03/2019   Procedure: PERIPHERAL VASCULAR BALLOON ANGIOPLASTY;  Surgeon: Lorretta Harp, MD;  Location: [facility];  Service: Cardiovascular;  Laterality: Left;  SFA/ popliteal    reports that she has been smoking cigarettes. She has been smoking about  0.50 packs per day. She has quit using smokeless tobacco. She reports current alcohol use. She reports that she does not use drugs. family history is not on file. Allergies  Allergen Reactions  . Amlodipine Other (See Comments)    Ringing in ears      Outpatient Encounter Medications as of 10/09/2019  Medication Sig  . aspirin EC 81 MG tablet Take 81 mg by mouth daily.  . carvedilol (COREG) 25 MG tablet TAKE 1 TABLET(25 MG) BY MOUTH TWICE DAILY  . cilostazol (PLETAL) 50 MG tablet Take 1 tablet (50 mg total) by mouth 2 (two) times daily.  . clopidogrel (PLAVIX) 75 MG tablet Take 1 tablet (75 mg total) by mouth daily with breakfast.  . cyclobenzaprine (FLEXERIL) 10 MG tablet Take 1 tablet (10 mg total) by mouth at bedtime as needed. (Patient taking differently: Take 10 mg by mouth at bedtime. )  . ezetimibe (ZETIA) 10 MG tablet TAKE 1 TABLET(10 MG) BY MOUTH DAILY (Patient taking differently: Take 10 mg by mouth daily. )  . Fish Oil-Cholecalciferol (FISH OIL + D3) 1200-1000 MG-UNIT CAPS Take 1 capsule by mouth daily.  Marland Kitchen losartan (COZAAR) 100 MG tablet TAKE 1 TABLET BY MOUTH DAILY (Patient taking differently: Take 100 mg by mouth daily. )  . MAGNESIUM CITRATE PO Take 480 mg by mouth 2 (two) times a day.  . Melatonin 12 MG TABS Take 12 mg by mouth at bedtime.  . Multiple Vitamin (MULTIVITAMIN WITH MINERALS) TABS tablet Take 1 tablet by mouth every evening. Women's One-A-Day  . pantoprazole (PROTONIX) 40 MG tablet Take 1 tablet (40 mg total) by mouth daily.  . rosuvastatin (CRESTOR) 5 MG tablet Take 5 mg by mouth daily.  . varenicline (CHANTIX CONTINUING MONTH PAK) 1 MG tablet Take 1 tablet (1 mg total) by mouth 2 (two) times daily.  . varenicline (CHANTIX STARTING MONTH PAK) 0.5 MG X 11 & 1 MG X 42 tablet Take one 0.5 mg tablet by mouth once daily for 3 days, then increase to one 0.5 mg tablet twice daily for 4 days, then increase to one 1 mg tablet twice daily.  . [DISCONTINUED] ibuprofen (ADVIL)  200 MG tablet Take 400 mg by mouth at bedtime.  Marland Kitchen acetaminophen-codeine (TYLENOL #3) 300-30 MG tablet Take 1 tablet by mouth every 6 (six) hours as needed for moderate pain. (Patient not taking: Reported on 10/06/2019)   Facility-Administered Encounter Medications as of 10/09/2019  Medication  . sodium chloride flush (NS) 0.9 % injection 3 mL     REVIEW OF SYSTEMS  : All other systems reviewed and negative except where noted in the History of Present Illness.   PHYSICAL EXAM: BP (!) 152/80 (BP Location: Right Arm, Patient Position: Sitting, Cuff Size: Normal)   Pulse 78   Temp 97.7 F (36.5 C) (Oral)   Ht 5\' 6"  (1.676 m)   Wt 153 lb (69.4 kg)   BMI 24.69 kg/m  General: Well developed white female in no acute distress Head: Normocephalic and atraumatic Eyes:  Sclerae anicteric, conjunctiva pink. Ears: Normal auditory acuity Lungs: Clear throughout to auscultation; no increased WOB. Heart: Regular rate and rhythm; no M/R/G. Abdomen: Soft, non-distended.  BS present.  Minimal lower abdominal TTP. Rectal:  Will be done at the time of colonoscopy. Musculoskeletal: Symmetrical with no gross deformities  Skin: No lesions on visible extremities Extremities: No edema  Neurological: Alert oriented x 4, grossly non-focal Psychological:  Alert and cooperative. Normal mood and affect  ASSESSMENT AND PLAN: *Recurrent diverticulitis x 3 since September with 2 CT scans showing wall thickening in the sigmoid colon.  Last colonoscopy was 6 years ago with a large hyperplastic polyp removed.  I think that we should repeat colonoscopy for evaluation just to confirm there are no other issues.  If she continues to have recurrent episodes of diverticulitis then may need to consider surgical evaluation.  She just completed a course of antibiotics about a week ago and is feeling better currently.  We will plan for colonoscopy about 5 weeks from now.  She was advised to contact her office to let us know about  any recurrent symptoms in the interim.  I did send a prescription for Levsin for her to use as needed for any associated abdominal cramping. *Chronic antiplatelet use with Plavix and Pletal due to PAD:  Hold Plavix for 5 days and Pletal for 2 days before procedure - will instruct when and how to resume after procedure. Risks and benefits of procedure including bleeding, perforation, infection, missed lesions, medication reactions and possible hospitalization or surgery if complications occur explained. Additional rare but real risk of cardiovascular event such as heart attack or ischemia/infarct of other organs off of Plavix and Pletal explained and need to seek urgent help if this occurs. Will communicate by phone or EMR with patient's prescribing providers, Dr. Radford Pax and Dr. Gwenlyn Found, to confirm that holding Plavix and Pletal is reasonable in this case.    CC:  Dorena Dew, FNP

## 2019-10-10 ENCOUNTER — Other Ambulatory Visit: Payer: Self-pay | Admitting: Cardiovascular Disease

## 2019-10-10 NOTE — Telephone Encounter (Signed)
That's fine with me Suanne Marker. It will have been 3 months from the intervention and no stent was implanted   JJB

## 2019-10-12 NOTE — Telephone Encounter (Addendum)
Pt informed - ok to hold plavix x5 days prior to procedure and ok to hold  Petal 2 days prior to procedure. Pt voiced understanding.

## 2019-10-12 NOTE — Telephone Encounter (Addendum)
   Primary Cardiologist: Fransico Him, MD / Quay Burow, MD  Chart reviewed as part of pre-operative protocol coverage. Patient was last seen by Dr. Gwenlyn Found for a virtual visit on 08/19/2019 at which time she was doing well.    Per Dr. Gwenlyn Found, [place] to hold Plavix for 5 days and Pletal for 2 days prior to colonoscopy as requested. Both should be restarted as soon as able following procedure.   I will route this recommendation to the requesting party via Epic fax function and remove from pre-op pool.  Please call with questions.  Darreld Mclean, PA-C 10/12/2019, 8:58 AM

## 2019-10-12 NOTE — Progress Notes (Signed)
Reviewed and agree with management plan.  Chrissi Crow T. Lachell Rochette, MD FACG [facility]  

## 2019-10-21 ENCOUNTER — Other Ambulatory Visit: Payer: Self-pay | Admitting: Cardiovascular Disease

## 2019-10-21 NOTE — Telephone Encounter (Signed)
Pt's pharmacy is requesting a 90 day supply of these medications. Please address

## 2019-10-22 MED ORDER — PANTOPRAZOLE SODIUM 40 MG PO TBEC: 40.0000 mg | DELAYED_RELEASE_TABLET | Freq: Every day | ORAL | 3 refills | Status: DC

## 2019-10-22 MED ORDER — CLOPIDOGREL BISULFATE 75 MG PO TABS: 75.0000 mg | ORAL_TABLET | Freq: Every day | ORAL | 11 refills | Status: DC

## 2019-11-03 ENCOUNTER — Other Ambulatory Visit: Payer: Self-pay

## 2019-11-03 ENCOUNTER — Ambulatory Visit: Payer: Medicare Other | Admitting: Family Medicine

## 2019-11-03 VITALS — BP 144/80

## 2019-11-03 DIAGNOSIS — I10 Essential (primary) hypertension: Secondary | ICD-10-CM

## 2019-11-06 ENCOUNTER — Other Ambulatory Visit: Payer: Self-pay | Admitting: Gastroenterology

## 2019-11-06 ENCOUNTER — Ambulatory Visit (INDEPENDENT_AMBULATORY_CARE_PROVIDER_SITE_OTHER): Payer: Medicare Other

## 2019-11-06 DIAGNOSIS — Z1159 Encounter for screening for other viral diseases: Secondary | ICD-10-CM

## 2019-11-07 LAB — SARS CORONAVIRUS 2 (TAT 6-24 HRS): SARS Coronavirus 2: NEGATIVE

## 2019-11-10 ENCOUNTER — Other Ambulatory Visit: Payer: Self-pay

## 2019-11-10 ENCOUNTER — Encounter: Payer: Self-pay | Admitting: Gastroenterology

## 2019-11-10 ENCOUNTER — Telehealth: Payer: Self-pay | Admitting: Family Medicine

## 2019-11-10 ENCOUNTER — Ambulatory Visit (AMBULATORY_SURGERY_CENTER): Payer: Medicare Other | Admitting: Gastroenterology

## 2019-11-10 VITALS — BP 136/89 | HR 58 | Temp 96.6°F | Resp 14 | Ht 66.0 in | Wt 153.0 lb

## 2019-11-10 DIAGNOSIS — R933 Abnormal findings on diagnostic imaging of other parts of digestive tract: Secondary | ICD-10-CM

## 2019-11-10 DIAGNOSIS — K640 First degree hemorrhoids: Secondary | ICD-10-CM

## 2019-11-10 DIAGNOSIS — K5792 Diverticulitis of intestine, part unspecified, without perforation or abscess without bleeding: Secondary | ICD-10-CM

## 2019-11-10 DIAGNOSIS — D125 Benign neoplasm of sigmoid colon: Secondary | ICD-10-CM

## 2019-11-10 DIAGNOSIS — K635 Polyp of colon: Secondary | ICD-10-CM | POA: Diagnosis not present

## 2019-11-10 MED ORDER — SODIUM CHLORIDE 0.9 % IV SOLN: 500.0000 mL | Freq: Once | INTRAVENOUS | Status: DC

## 2019-11-10 NOTE — Patient Instructions (Signed)
Resume PLAVIX(CLOPIDROGEL) IN 2 DAYS.  Handouts given for polyps, hemorrhoids, diverticulosis and high fiber diet.  YOU HAD AN ENDOSCOPIC PROCEDURE TODAY AT [facility]:   Refer to the procedure report that was given to you for any specific questions about what was found during the examination.  If the procedure report does not answer your questions, please call your gastroenterologist to clarify.  If you requested that your care partner not be given the details of your procedure findings, then the procedure report has been included in a sealed envelope for you to review at your convenience later.  YOU SHOULD EXPECT: Some feelings of bloating in the abdomen. Passage of more gas than usual.  Walking can help get rid of the air that was put into your GI tract during the procedure and reduce the bloating. If you had a lower endoscopy (such as a colonoscopy or flexible sigmoidoscopy) you may notice spotting of blood in your stool or on the toilet paper. If you underwent a bowel prep for your procedure, you may not have a normal bowel movement for a few days.  Please Note:  You might notice some irritation and congestion in your nose or some drainage.  This is from the oxygen used during your procedure.  There is no need for concern and it should clear up in a day or so.  SYMPTOMS TO REPORT IMMEDIATELY:   Following lower endoscopy (colonoscopy or flexible sigmoidoscopy):  Excessive amounts of blood in the stool  Significant tenderness or worsening of abdominal pains  Swelling of the abdomen that is new, acute  Fever of 100F or higher  For urgent or emergent issues, a gastroenterologist can be reached at any hour by calling [phone]. Do not use MyChart messaging for urgent concerns.    DIET:  We do recommend a small meal at first, but then you may proceed to your regular diet.  Drink plenty of fluids but you should avoid alcoholic beverages for 24 hours.  ACTIVITY:  You  should plan to take it easy for the rest of today and you should NOT DRIVE or use heavy machinery until tomorrow (because of the sedation medicines used during the test).    FOLLOW UP: Our staff will call the number listed on your records 48-72 hours following your procedure to check on you and address any questions or concerns that you may have regarding the information given to you following your procedure. If we do not reach you, we will leave a message.  We will attempt to reach you two times.  During this call, we will ask if you have developed any symptoms of COVID 19. If you develop any symptoms (ie: fever, flu-like symptoms, shortness of breath, cough etc.) before then, please call [phone].  If you test positive for Covid 19 in the 2 weeks post procedure, please call and report this information to [place].    If any biopsies were taken you will be contacted by phone or by letter within the next 1-3 weeks.  Please call us at [phone] if you have not heard about the biopsies in 3 weeks.    SIGNATURES/CONFIDENTIALITY: You and/or your care partner have signed paperwork which will be entered into your electronic medical record.  These signatures attest to the fact that that the information above on your After Visit Summary has been reviewed and is understood.  Full responsibility of the confidentiality of this discharge information lies with you and/or your care-partner.

## 2019-11-10 NOTE — Progress Notes (Signed)
Pt's states no medical or surgical changes since previsit or office visit.  ADB -temp CW - vitals

## 2019-11-10 NOTE — Op Note (Addendum)
Margaret Kelly Patient Name: Margaret Kelly Procedure Date: 11/10/2019 1:38 PM MRN: [id number] Endoscopist: [name] , MD Age: 69 Referring MD:  Date of Birth: 07/02/1951 Gender: Female Account #: [id number] Procedure:                Colonoscopy Indications:              Abnormal CT of the GI tract Medicines:                Monitored Anesthesia Care Procedure:                Pre-Anesthesia Assessment:                           - Prior to the procedure, a History and Physical                            was performed, and patient medications and                            allergies were reviewed. The patient's tolerance of                            previous anesthesia was also reviewed. The risks                            and benefits of the procedure and the sedation                            options and risks were discussed with the patient.                            All questions were answered, and informed consent                            was obtained. Prior Anticoagulants: The patient has                            taken Plavix (clopidogrel), last dose was 2 days                            prior to procedure. ASA Grade Assessment: II - A                            patient with mild systemic disease. After reviewing                            the risks and benefits, the patient was deemed in                            satisfactory condition to undergo the procedure.                           After obtaining informed consent, the colonoscope  was passed under direct vision. Throughout the                            procedure, the patient's blood pressure, pulse, and                            oxygen saturations were monitored continuously. The                            Colonoscope was introduced through the anus and                            advanced to the the cecum, identified by                            appendiceal orifice and  ileocecal valve. The                            ileocecal valve, appendiceal orifice, and rectum                            were photographed. The quality of the bowel                            preparation was adequate after extensive lavage and                            suctioning. The patient tolerated the procedure                            well. The colonoscopy was somewhat difficult due to                            significant looping and a tortuous colon. Scope In: 1:44:08 PM Scope Out: 2:08:51 PM Scope Withdrawal Time: 0 hours 13 minutes 6 seconds  Total Procedure Duration: 0 hours 24 minutes 43 seconds  Findings:                 Hemorrhoids and rectal prolapse were found on                            perianal exam.                           A 8 mm polyp was found in the sigmoid colon. The                            polyp was sessile. The polyp was removed with a                            cold snare. Resection and retrieval were complete.                           Multiple medium-mouthed diverticula were found in  the left colon. There was no evidence of                            diverticular bleeding.                           Internal hemorrhoids were found during                            retroflexion. The hemorrhoids were moderate and                            Grade I (internal hemorrhoids that do not prolapse).                           The exam was otherwise without abnormality on                            direct and retroflexion views. Complications:            No immediate complications. Estimated blood loss:                            None. Estimated Blood Loss:     Estimated blood loss: none. Impression:               - Hemorrhoids and rectal prolapse found on perianal                            exam.                           - One 8 mm polyp in the sigmoid colon, removed with                            a cold snare. Resected and  retrieved.                           - Moderate diverticulosis in the left colon.                           - External and internal hemorrhoids.                           - The examination was otherwise normal on direct                            and retroflexion views. Recommendation:           - Repeat colonoscopy date to be determined after                            pending pathology results are reviewed for                            surveillance based on pathology results.                           -  Resume Plavix (clopidogrel) in 2 days at prior                            dose. Refer to managing physician for further                            adjustment of therapy.                           - Patient has a contact number available for                            emergencies. The signs and symptoms of potential                            delayed complications were discussed with the                            patient. Return to normal activities tomorrow.                            Written discharge instructions were provided to the                            patient.                           - High fiber diet.                           - Continue present medications.                           - Await pathology results. [name], MD 11/10/2019 2:15:42 PM This report has been signed electronically.

## 2019-11-10 NOTE — Telephone Encounter (Signed)
Pt called wanting to speak to you regarding the results of her colonoscopy and meds she is taking.

## 2019-11-10 NOTE — Progress Notes (Signed)
PT taken to PACU. Monitors in place. VSS. Report given to RN. 

## 2019-11-10 NOTE — Telephone Encounter (Signed)
Called patient. She says her colonoscopy went well today and there was only 1 polyp noted which is believed to be benign. She told the gasto doctor that all these symptoms started after she was given several medications for blood pressure and she things this has something to do with it. Sherrill Raring Dr asked her to discuss this with [place] and her cardiologist. Please advise. Her next appointment with [place] is in 8 weeks.

## 2019-11-10 NOTE — Progress Notes (Signed)
Pt did not stop plavix and pletal as instructed per telephone, pt states "my instructions said to hold for one day", however, the instructions read that she would receive a phone call with instructions about her plavix and pletal.  Per Dr Fuller Plan, pt to resume plavix in 2 days, pt to resume pletal as ordered.

## 2019-11-10 NOTE — Progress Notes (Signed)
Called to room to assist during endoscopic procedure.  Patient ID and intended procedure confirmed with present staff. Received instructions for my participation in the procedure from the performing physician.  

## 2019-11-12 ENCOUNTER — Telehealth: Payer: Self-pay

## 2019-11-12 DIAGNOSIS — H2513 Age-related nuclear cataract, bilateral: Secondary | ICD-10-CM | POA: Diagnosis not present

## 2019-11-12 DIAGNOSIS — H35033 Hypertensive retinopathy, bilateral: Secondary | ICD-10-CM | POA: Diagnosis not present

## 2019-11-12 DIAGNOSIS — D3132 Benign neoplasm of left choroid: Secondary | ICD-10-CM | POA: Diagnosis not present

## 2019-11-12 DIAGNOSIS — H25013 Cortical age-related cataract, bilateral: Secondary | ICD-10-CM | POA: Diagnosis not present

## 2019-11-12 NOTE — Telephone Encounter (Signed)
  Follow up Call-  Call back number 11/10/2019  Post procedure Call Back phone  # [phone]  Permission to leave phone message Yes  Some recent data might be hidden     Patient questions:  Do you have a fever, pain , or abdominal swelling? No. Pain Score  0 *  Have you tolerated food without any problems? Yes.    Have you been able to return to your normal activities? Yes.    Do you have any questions about your discharge instructions: Diet   No. Medications  No. Follow up visit  No.  Do you have questions or concerns about your Care? No.  Actions: * If pain score is 4 or above: No action needed, pain <4. 1. Have you developed a fever since your procedure? no  2.   Have you had an respiratory symptoms (SOB or cough) since your procedure? no  3.   Have you tested positive for COVID 19 since your procedure no  4.   Have you had any family members/close contacts diagnosed with the COVID 19 since your procedure?  no   If yes to any of these questions please route to Joylene John, RN and Alphonsa Gin, Therapist, sports.

## 2019-11-19 ENCOUNTER — Encounter: Payer: Self-pay | Admitting: Gastroenterology

## 2019-12-07 ENCOUNTER — Other Ambulatory Visit: Payer: Self-pay

## 2019-12-07 MED ORDER — PANTOPRAZOLE SODIUM 40 MG PO TBEC: 40.0000 mg | DELAYED_RELEASE_TABLET | Freq: Every day | ORAL | 3 refills | Status: DC

## 2019-12-16 ENCOUNTER — Other Ambulatory Visit: Payer: Self-pay

## 2019-12-16 DIAGNOSIS — Z23 Encounter for immunization: Secondary | ICD-10-CM | POA: Diagnosis not present

## 2019-12-16 DIAGNOSIS — M62838 Other muscle spasm: Secondary | ICD-10-CM

## 2019-12-16 MED ORDER — CYCLOBENZAPRINE HCL 10 MG PO TABS: 10.0000 mg | ORAL_TABLET | Freq: Every evening | ORAL | 0 refills | Status: DC | PRN

## 2019-12-17 ENCOUNTER — Other Ambulatory Visit: Payer: Self-pay | Admitting: Physician Assistant

## 2019-12-17 NOTE — Telephone Encounter (Signed)
This is Dr. Berry's pt 

## 2020-01-05 ENCOUNTER — Ambulatory Visit (INDEPENDENT_AMBULATORY_CARE_PROVIDER_SITE_OTHER): Payer: Medicare Other | Admitting: Family Medicine

## 2020-01-05 ENCOUNTER — Encounter: Payer: Self-pay | Admitting: Family Medicine

## 2020-01-05 ENCOUNTER — Other Ambulatory Visit: Payer: Self-pay

## 2020-01-05 VITALS — BP 176/90 | HR 70 | Ht 66.0 in | Wt 156.0 lb

## 2020-01-05 DIAGNOSIS — I10 Essential (primary) hypertension: Secondary | ICD-10-CM

## 2020-01-05 DIAGNOSIS — I739 Peripheral vascular disease, unspecified: Secondary | ICD-10-CM | POA: Diagnosis not present

## 2020-01-05 LAB — POCT URINALYSIS DIPSTICK
Bilirubin, UA: NEGATIVE
Glucose, UA: NEGATIVE
Ketones, UA: NEGATIVE
Leukocytes, UA: NEGATIVE
Nitrite, UA: NEGATIVE
Protein, UA: NEGATIVE
Spec Grav, UA: 1.02 (ref 1.010–1.025)
Urobilinogen, UA: 0.2 E.U./dL
pH, UA: 6.5 (ref 5.0–8.0)

## 2020-01-05 MED ORDER — HYDROCHLOROTHIAZIDE 12.5 MG PO TABS: 12.5000 mg | ORAL_TABLET | Freq: Every day | ORAL | 1 refills | Status: DC

## 2020-01-05 MED ORDER — CLONIDINE HCL 0.1 MG PO TABS: 0.1000 mg | ORAL_TABLET | Freq: Once | ORAL | Status: DC

## 2020-01-05 NOTE — Patient Instructions (Addendum)
Will start a trial of hydrochlorothiazide 12.5 mg daily. Please read over the information pertaining to this drug. You will see the nurse in 1 week for a blood pressure check and please bring your log.   Continue to follow a low fat, low salt diet and remain active. When you get your pool summer ready, daily laps in the pool sounds wonderful. Also, continue with your raw veggies!  Your weight is stable, I am proud that you have been able to maintain over the past year.    Hydrochlorothiazide, HCTZ; Irbesartan Tablets What is this medicine? HYDROCHLOROTHIAZIDE; IRBESARTAN (hye dro klor oh THYE a zide; ir be SAR tan) is a combination of a diuretic and an angiotensin II receptor blocker. It treats high blood pressure. This medicine may be used for other purposes; ask your health care provider or pharmacist if you have questions. COMMON BRAND NAME(S): Avalide What should I tell my health care provider before I take this medicine? They need to know if you have any of these conditions:  decreased urine  diabetes  if you are on a special diet, like a low salt diet  immune system problems, like lupus  kidney disease  liver disease  an unusual or allergic reaction to irbesartan, hydrochlorothiazide, sulfa drugs, other medicines, foods, dyes, or preservatives  pregnant or trying to get pregnant  breast-feeding How should I use this medicine? Take this drug by mouth. Take it as directed on the prescription label at the same time every day. You can take it with or without food. If it upsets your stomach, take it with food. Keep taking it unless your health care provider tells you to stop. Talk to your health care provider about the use of this drug in children. Special care may be needed. Overdosage: If you think you have taken too much of this medicine contact a poison control center or emergency room at once. NOTE: This medicine is only for you. Do not share this medicine with others. What  if I miss a dose? If you miss a dose, take it as soon as you can. If it is almost time for your next dose, take only that dose. Do not take double or extra doses. What may interact with this medicine?  barbiturates like phenobarbital  corticosteroids like prednisone  diabetic medicines  diuretics like triamterene, spironolactone or amiloride  lithium  NSAIDs like ibuprofen  potassium salts or potassium supplements  prescription pain medicines  skeletal muscle relaxants like tubocurarine  some cholesterol lowering medicines like cholestyramine or colestipol This list may not describe all possible interactions. Give your health care provider a list of all the medicines, herbs, non-prescription drugs, or dietary supplements you use. Also tell them if you smoke, drink alcohol, or use illegal drugs. Some items may interact with your medicine. What should I watch for while using this medicine? You must visit your health care professional for regular checks on your progress. Check your blood pressure regularly while you are taking this medicine. Ask your doctor or health care professional what your blood pressure should be and when you should contact him or her. When you check your blood pressure, write down the measurements to show your doctor or health care professional. You must not get dehydrated. Ask your doctor or health care professional how much fluid you need to drink a day. Check with him or her if you get an attack of severe diarrhea, nausea and vomiting, or if you sweat a lot. The loss of  too much body fluid can make it dangerous for you to take this medicine. Women should inform their doctor if they wish to become pregnant or think they might be pregnant. There is a potential for serious side effects to an unborn child, particularly in the second or third trimester. Talk to your health care professional or pharmacist for more information. You may get drowsy or dizzy. Do not drive,  use machinery, or do anything that needs mental alertness until you know how this drug affects you. Do not stand or sit up quickly, especially if you are an older patient. This reduces the risk of dizzy or fainting spells. Alcohol can make you more drowsy and dizzy. Avoid alcoholic drinks. This medicine may increase blood sugar. Ask your healthcare provider if changes in diet or medicines are needed if you have diabetes. Avoid salt substitutes unless you are told otherwise by your doctor or health care professional. Talk to your health care professional about your risk of skin cancer. You may be more at risk for skin cancer if you take this medicine. This medicine can make you more sensitive to the sun. Keep out of the sun. If you cannot avoid being in the sun, wear protective clothing and use sunscreen. Do not use sun lamps or tanning beds/booths. Do not treat yourself for coughs, colds, or pain while you are taking this medicine without asking your doctor or health care professional for advice. Some ingredients may increase your blood pressure. What side effects may I notice from receiving this medicine? Side effects that you should report to your doctor or health care professional as soon as possible:  allergic reactions like skin rash, itching or hives, swelling of the face, lips, or tongue  breathing problems  changes in vision  dark urine  eye pain  fast or irregular heart beat, palpitations, or chest pain  feeling faint or lightheaded  muscle cramps  persistent dry cough  redness, blistering, peeling or loosening of the skin, including inside the mouth   signs and symptoms of high blood sugar such as being more thirsty or hungry or having to urinate more than normal. You may also feel very tired or have blurry vision.  stomach pain  trouble passing urine  unusual bleeding or bruising  worsened gout pain  yellowing of the eyes or skin Side effects that usually do not  require medical attention (report to your doctor or health care professional if they continue or are bothersome):  change in sex drive or performance  headache This list may not describe all possible side effects. Call your doctor for medical advice about side effects. You may report side effects to FDA at 1-800-FDA-1088. Where should I keep my medicine? Keep out of the reach of children and pets. Store at room temperature between 15 and 30 degrees C (59 and 86 degrees F). Throw away any unused drug after the expiration date. NOTE: This sheet is a summary. It may not cover all possible information. If you have questions about this medicine, talk to your doctor, pharmacist, or health care provider.  2020 [employer]/Gold Standard (2019-05-01 18:52:06)

## 2020-01-05 NOTE — Progress Notes (Signed)
[facility]    Subjective:  Patient ID: Margaret Kelly, female    DOB: 1950/10/29  Age: 69 y.o. MRN: :[id number]  CC:  Chief Complaint  Patient presents with  . Follow-up    3 month follow up , for htn     HPI Margaret Kelly is a very pleasant 69 year old female with a medical history significant for hypertension, hyperlipidemia, history of PAD on plavix, GERD, history of hepatitis C, and generalized anxiety presents for a follow up of hypertension. Patient says that blood pressure has been elevated over the past several months despite taking medication consistently. She generally follows a low fat, low cholesterol diet and is very active.   Hypertension This is a chronic problem. The problem is uncontrolled. Risk factors for coronary artery disease include dyslipidemia and smoking/tobacco exposure. There are no compliance problems.  Hypertensive end-organ damage includes PVD. There is no history of kidney disease.     Past Medical History:  Diagnosis Date  . Anxiety   . Blood transfusion without reported diagnosis 1982  . GERD (gastroesophageal reflux disease)   . History of hepatitis C 10/13/2015  . Hyperlipidemia LDL goal <100 10/13/2015  . Hypertension   . Muscle spasm of left lower extremity 10/13/2015  . Nerve damage 2001   left side as result of fall  . PAD (peripheral artery disease) ([place])   . Pain of left hip joint 02/21/2018  . Plantar wart     Past Surgical History:  Procedure Laterality Date  . ABDOMINAL AORTOGRAM W/LOWER EXTREMITY Bilateral 03/26/2019   Procedure: ABDOMINAL AORTOGRAM W/LOWER EXTREMITY;  Surgeon: Lorretta Harp, MD;  Location: [facility];  Service: Cardiovascular;  Laterality: Bilateral;  . ABDOMINAL AORTOGRAM W/LOWER EXTREMITY Bilateral 08/03/2019   Procedure: ABDOMINAL AORTOGRAM W/LOWER EXTREMITY;  Surgeon: Lorretta Harp, MD;  Location: [facility];  Service: Cardiovascular;   Laterality: Bilateral;  . ABDOMINAL HYSTERECTOMY  1982  . CARPAL TUNNEL RELEASE Left 2005  . foot spur Left 2007   foot  . PERIPHERAL VASCULAR BALLOON ANGIOPLASTY Left 08/03/2019   Procedure: PERIPHERAL VASCULAR BALLOON ANGIOPLASTY;  Surgeon: Lorretta Harp, MD;  Location: [facility];  Service: Cardiovascular;  Laterality: Left;  SFA/ popliteal    Family History  Problem Relation Age of Onset  . Colon cancer Neg Hx   . Esophageal cancer Neg Hx   . Rectal cancer Neg Hx   . Stomach cancer Neg Hx     Social History   Socioeconomic History  . Marital status: Divorced    Spouse name: Not on file  . Number of children: Not on file  . Years of education: Not on file  . Highest education level: Not on file  Occupational History  . Not on file  Tobacco Use  . Smoking status: Current Every Day Smoker    Packs/day: 0.50    Types: Cigarettes  . Smokeless tobacco: Former Systems developer  . Tobacco comment: about half a pack per day  Substance and Sexual Activity  . Alcohol use: Yes    Comment: rare  . Drug use: No  . Sexual activity: Not on file  Other Topics Concern  . Not on file  Social History Narrative  . Not on file   Social Determinants of Health   Financial Resource Strain:   . Difficulty of Paying Living Expenses:   Food Insecurity:   . Worried About [employer] of  Food in the Last Year:   . [place] in the Last Year:   Transportation Needs:   . Film/video editor (Medical):   Marland Kitchen Lack of Transportation (Non-Medical):   Physical Activity:   . Days of Exercise per Week:   . Minutes of Exercise per Session:   Stress:   . Feeling of Stress :   Social Connections:   . Frequency of Communication with Friends and Family:   . Frequency of Social Gatherings with Friends and Family:   . Attends Religious Services:   . Active Member of Clubs or Organizations:   . Attends Archivist Meetings:   Marland Kitchen Marital Status:   Intimate Partner Violence:   .  Fear of Current or Ex-Partner:   . Emotionally Abused:   Marland Kitchen Physically Abused:   . Sexually Abused:     Outpatient Medications Prior to Visit  Medication Sig Dispense Refill  . aspirin EC 81 MG tablet Take 81 mg by mouth daily.    . carvedilol (COREG) 25 MG tablet TAKE 1 TABLET(25 MG) BY MOUTH TWICE DAILY 180 tablet 3  . cilostazol (PLETAL) 50 MG tablet TAKE 1 TABLET(50 MG) BY MOUTH TWICE DAILY 180 tablet 2  . clopidogrel (PLAVIX) 75 MG tablet Take 1 tablet (75 mg total) by mouth daily with breakfast. 30 tablet 11  . cyclobenzaprine (FLEXERIL) 10 MG tablet Take 1 tablet (10 mg total) by mouth at bedtime as needed. 90 tablet 0  . ezetimibe (ZETIA) 10 MG tablet TAKE 1 TABLET(10 MG) BY MOUTH DAILY 90 tablet 2  . Fish Oil-Cholecalciferol (FISH OIL + D3) 1200-1000 MG-UNIT CAPS Take 1 capsule by mouth daily.    . hyoscyamine (LEVSIN SL) 0.125 MG SL tablet Place 1 tablet (0.125 mg total) under the tongue every 6 (six) hours as needed. 30 tablet 2  . losartan (COZAAR) 100 MG tablet TAKE 1 TABLET BY MOUTH DAILY (Patient taking differently: Take 100 mg by mouth daily. ) 90 tablet 3  . MAGNESIUM CITRATE PO Take 480 mg by mouth 2 (two) times a day.    . Melatonin 12 MG TABS Take 12 mg by mouth at bedtime.    . Multiple Vitamin (MULTIVITAMIN WITH MINERALS) TABS tablet Take 1 tablet by mouth every evening. Women's One-A-Day    . pantoprazole (PROTONIX) 40 MG tablet Take 1 tablet (40 mg total) by mouth daily. 30 tablet 3  . rosuvastatin (CRESTOR) 5 MG tablet Take 5 mg by mouth daily.    Marland Kitchen acetaminophen-codeine (TYLENOL #3) 300-30 MG tablet Take 1 tablet by mouth every 6 (six) hours as needed for moderate pain. (Patient not taking: Reported on 10/06/2019) 20 tablet 0  . varenicline (CHANTIX CONTINUING MONTH PAK) 1 MG tablet Take 1 tablet (1 mg total) by mouth 2 (two) times daily. (Patient not taking: Reported on 01/05/2020) 60 tablet 3  . varenicline (CHANTIX STARTING MONTH PAK) 0.5 MG X 11 & 1 MG X 42 tablet  Take one 0.5 mg tablet by mouth once daily for 3 days, then increase to one 0.5 mg tablet twice daily for 4 days, then increase to one 1 mg tablet twice daily. (Patient not taking: Reported on 01/05/2020) 53 tablet 0   No facility-administered medications prior to visit.    Allergies  Allergen Reactions  . Amlodipine Other (See Comments)    Ringing in ears    ROS Review of Systems  Constitutional: Negative.   HENT: Negative.   Eyes: Negative.   Respiratory: Negative.  Cardiovascular: Negative.   Gastrointestinal: Negative.   Endocrine: Negative.   Genitourinary: Negative.   Musculoskeletal: Positive for back pain.  Skin: Negative.   Neurological: Negative.   Hematological: Negative.   Psychiatric/Behavioral: Negative.       Objective:    Physical Exam  Constitutional: She is oriented to person, place, and time. She appears well-developed.  HENT:  Head: Normocephalic.  Eyes: Pupils are equal, round, and reactive to light.  Cardiovascular: Normal rate and regular rhythm.  Pulmonary/Chest: Effort normal and breath sounds normal.  Abdominal: Soft. Bowel sounds are normal.  Musculoskeletal:     Cervical back: Normal range of motion.  Neurological: She is alert and oriented to person, place, and time.  Skin: Skin is warm.  Psychiatric: She has a normal mood and affect. Her behavior is normal. Judgment and thought content normal.    Ht 5\' 6"  (1.676 m)   Wt 156 lb (70.8 kg)   BMI 25.18 kg/m  Wt Readings from Last 3 Encounters:  01/05/20 156 lb (70.8 kg)  11/10/19 153 lb (69.4 kg)  10/09/19 153 lb (69.4 kg)     Health Maintenance Due  Topic Date Due  . DEXA SCAN  Never done  . PNA vac Low Risk Adult (1 of 2 - PCV13) 05/22/2018  . MAMMOGRAM  11/13/2019  . COVID-19 Vaccine (2 - Pfizer 2-dose series) 01/06/2020    There are no preventive care reminders to display for this patient.  Lab Results  Component Value Date   TSH 1.370 03/20/2019   Lab Results    Component Value Date   WBC 9.3 08/04/2019   HGB 12.6 08/04/2019   HCT 37.3 08/04/2019   MCV 94.9 08/04/2019   PLT 263 08/04/2019   Lab Results  Component Value Date   NA 139 10/06/2019   K 4.4 10/06/2019   CO2 22 10/06/2019   GLUCOSE 134 (H) 10/06/2019   BUN 16 10/06/2019   CREATININE 0.61 10/06/2019   BILITOT 0.6 06/18/2019   ALKPHOS 64 06/18/2019   AST 21 06/18/2019   ALT 20 06/18/2019   PROT 7.8 06/18/2019   ALBUMIN 4.3 06/18/2019   CALCIUM 9.6 10/06/2019   ANIONGAP 9 08/04/2019   Lab Results  Component Value Date   CHOL 153 06/08/2019   Lab Results  Component Value Date   HDL 64 06/08/2019   Lab Results  Component Value Date   LDLCALC 73 06/08/2019   Lab Results  Component Value Date   TRIG 87 06/08/2019   Lab Results  Component Value Date   CHOLHDL 2.4 06/08/2019   No results found for: HGBA1C    Assessment & Plan:   Problem List Items Addressed This Visit      Cardiovascular and Mediastinum   Essential hypertension - Primary   Relevant Orders   POCT Urinalysis Dipstick   Comprehensive metabolic panel     Essential hypertension BP (!) 182/90 (BP Location: Right Arm, Patient Position: Sitting) Comment: manually  Pulse 70   Ht 5\' 6"  (1.676 m)   Wt 156 lb (70.8 kg)   SpO2 98%   BMI 25.18 kg/m  Blood pressure decreased to 162/80 manually following clonidine. Will start a trial of hydrochlorothiazide. Patient will return to clinic in 1 week for BP check. Also, patient advised to maintain a blood pressure journal   - POCT Urinalysis Dipstick - Comprehensive metabolic panel - hydrochlorothiazide (HYDRODIURIL) 12.5 MG tablet; Take 1 tablet (12.5 mg total) by mouth daily.  Dispense: 30 tablet; Refill: 1 -  CBC  Accelerated hypertension - cloNIDine (CATAPRES) tablet 0.1 mg - Thyroid Panel With TSH  Peripheral arterial disease (H[facility]) Continue medications, no changes warranted. Follow up with cardiologist as scheduled.    Follow-up: Return in  about 3 months (around 04/06/2020).    [name]  APRN, MSN, FNP-C Patient [facility] [address] [place], [place] [address] [phone]

## 2020-01-06 ENCOUNTER — Encounter: Payer: Self-pay | Admitting: Family Medicine

## 2020-01-06 DIAGNOSIS — Z23 Encounter for immunization: Secondary | ICD-10-CM | POA: Diagnosis not present

## 2020-01-06 LAB — COMPREHENSIVE METABOLIC PANEL
ALT: 19 IU/L (ref 0–32)
AST: 22 IU/L (ref 0–40)
Albumin/Globulin Ratio: 1.9 (ref 1.2–2.2)
Albumin: 4.7 g/dL (ref 3.8–4.8)
Alkaline Phosphatase: 85 IU/L (ref 39–117)
BUN/Creatinine Ratio: 19 (ref 12–28)
BUN: 14 mg/dL (ref 8–27)
Bilirubin Total: 0.4 mg/dL (ref 0.0–1.2)
CO2: 21 mmol/L (ref 20–29)
Calcium: 9.7 mg/dL (ref 8.7–10.3)
Chloride: 106 mmol/L (ref 96–106)
Creatinine, Ser: 0.74 mg/dL (ref 0.57–1.00)
GFR calc Af Amer: 98 mL/min/{1.73_m2} (ref >59)
GFR calc non Af Amer: 85 mL/min/{1.73_m2} (ref >59)
Globulin, Total: 2.5 g/dL (ref 1.5–4.5)
Glucose: 132 mg/dL — ABNORMAL HIGH (ref 65–99)
Potassium: 4.6 mmol/L (ref 3.5–5.2)
Sodium: 141 mmol/L (ref 134–144)
Total Protein: 7.2 g/dL (ref 6.0–8.5)

## 2020-01-06 LAB — THYROID PANEL WITH TSH
Free Thyroxine Index: 1.8 (ref 1.2–4.9)
T3 Uptake Ratio: 27 % (ref 24–39)
T4, Total: 6.7 ug/dL (ref 4.5–12.0)
TSH: 1.86 u[IU]/mL (ref 0.450–4.500)

## 2020-01-06 LAB — CBC
Hematocrit: 41.7 % (ref 34.0–46.6)
Hemoglobin: 14.1 g/dL (ref 11.1–15.9)
MCH: 32.6 pg (ref 26.6–33.0)
MCHC: 33.8 g/dL (ref 31.5–35.7)
MCV: 96 fL (ref 79–97)
Platelets: 336 10*3/uL (ref 150–450)
RBC: 4.33 x10E6/uL (ref 3.77–5.28)
RDW: 12.4 % (ref 11.7–15.4)
WBC: 8.5 10*3/uL (ref 3.4–10.8)

## 2020-01-07 ENCOUNTER — Telehealth: Payer: Self-pay

## 2020-01-07 NOTE — Telephone Encounter (Signed)
-----   Message from Dorena Dew, [place] sent at 01/07/2020 10:34 AM EDT ----- Regarding: lab results Please inform patient that all laboratory values are within a normal range. Inquire about new medication. Patient was started on hydrochlorothiazide for better control of blood pressure. She will follow up for a blood pressure check in 1 week. Continue lowfat, low sodium diet and regular physical activity as discussed during appointment.    [name]  APRN, MSN, FNP-C [facility] [address] [place], [place] [address] [phone]

## 2020-01-07 NOTE — Telephone Encounter (Signed)
Lvm for patient to call back

## 2020-01-07 NOTE — Telephone Encounter (Signed)
Called and lvm for patient to call back. 

## 2020-01-08 ENCOUNTER — Telehealth: Payer: Self-pay | Admitting: Gastroenterology

## 2020-01-08 ENCOUNTER — Telehealth: Payer: Self-pay

## 2020-01-08 ENCOUNTER — Telehealth: Payer: Self-pay | Admitting: Family Medicine

## 2020-01-08 NOTE — Telephone Encounter (Signed)
Called spoke w/ patient  about lab results.

## 2020-01-08 NOTE — Telephone Encounter (Signed)
-----   Message from Dorena Dew, [place] sent at 01/07/2020 10:34 AM EDT ----- Regarding: lab results Please inform patient that all laboratory values are within a normal range. Inquire about new medication. Patient was started on hydrochlorothiazide for better control of blood pressure. She will follow up for a blood pressure check in 1 week. Continue lowfat, low sodium diet and regular physical activity as discussed during appointment.    [name]  APRN, MSN, FNP-C [facility] [address] [place], [place] [address] [phone]

## 2020-01-08 NOTE — Telephone Encounter (Signed)
Called advised pt of her lab results, and medication as well diet.She said that she took the Hctz for a couple days then stop because she got a bad case of Gas not due to medication. She that she will start back taking this once she gets over this issue w/ bowls . She was Advised by GI to Gwendolyn Lima to help with this issue. Patient has an appt to follow up on 01/13/20

## 2020-01-08 NOTE — Telephone Encounter (Signed)
Patient reports that she is having constipation and gas.  She is having difficulty expelling the gas.  She will try gas x before her meals and Miralax 1-3 times today to stimulate a BM.  She will call back for any additional questions or concerns.

## 2020-01-09 ENCOUNTER — Other Ambulatory Visit: Payer: Self-pay

## 2020-01-09 ENCOUNTER — Encounter (HOSPITAL_COMMUNITY): Payer: Self-pay | Admitting: Emergency Medicine

## 2020-01-09 ENCOUNTER — Emergency Department (HOSPITAL_COMMUNITY)
Admission: EM | Admit: 2020-01-09 | Discharge: 2020-01-09 | Disposition: A | Discharge status: Home / Self Care | Payer: Medicare Other | Attending: Emergency Medicine | Admitting: Emergency Medicine

## 2020-01-09 ENCOUNTER — Emergency Department (HOSPITAL_COMMUNITY): Payer: Medicare Other

## 2020-01-09 ENCOUNTER — Telehealth: Payer: Self-pay | Admitting: Nurse Practitioner

## 2020-01-09 DIAGNOSIS — R109 Unspecified abdominal pain: Secondary | ICD-10-CM | POA: Diagnosis not present

## 2020-01-09 DIAGNOSIS — K5900 Constipation, unspecified: Secondary | ICD-10-CM | POA: Diagnosis not present

## 2020-01-09 DIAGNOSIS — R1084 Generalized abdominal pain: Secondary | ICD-10-CM | POA: Diagnosis not present

## 2020-01-09 DIAGNOSIS — K6289 Other specified diseases of anus and rectum: Secondary | ICD-10-CM | POA: Diagnosis not present

## 2020-01-09 LAB — COMPREHENSIVE METABOLIC PANEL
ALT: 19 U/L (ref 0–44)
AST: 21 U/L (ref 15–41)
Albumin: 4.4 g/dL (ref 3.5–5.0)
Alkaline Phosphatase: 67 U/L (ref 38–126)
Anion gap: 9 (ref 5–15)
BUN: 27 mg/dL — ABNORMAL HIGH (ref 8–23)
CO2: 28 mmol/L (ref 22–32)
Calcium: 9.5 mg/dL (ref 8.9–10.3)
Chloride: 98 mmol/L (ref 98–111)
Creatinine, Ser: 0.81 mg/dL (ref 0.44–1.00)
GFR calc Af Amer: >60 mL/min (ref >60)
GFR calc non Af Amer: >60 mL/min (ref >60)
Glucose, Bld: 156 mg/dL — ABNORMAL HIGH (ref 70–99)
Potassium: 4.3 mmol/L (ref 3.5–5.1)
Sodium: 135 mmol/L (ref 135–145)
Total Bilirubin: 0.9 mg/dL (ref 0.3–1.2)
Total Protein: 7.9 g/dL (ref 6.5–8.1)

## 2020-01-09 LAB — CBC
HCT: 47.3 % — ABNORMAL HIGH (ref 36.0–46.0)
Hemoglobin: 16.1 g/dL — ABNORMAL HIGH (ref 12.0–15.0)
MCH: 32.5 pg (ref 26.0–34.0)
MCHC: 34 g/dL (ref 30.0–36.0)
MCV: 95.4 fL (ref 80.0–100.0)
Platelets: 346 10*3/uL (ref 150–400)
RBC: 4.96 MIL/uL (ref 3.87–5.11)
RDW: 12.4 % (ref 11.5–15.5)
WBC: 10.8 10*3/uL — ABNORMAL HIGH (ref 4.0–10.5)
nRBC: 0 % (ref 0.0–0.2)

## 2020-01-09 LAB — LIPASE, BLOOD: Lipase: 23 U/L (ref 11–51)

## 2020-01-09 MED ORDER — SODIUM CHLORIDE (PF) 0.9 % IJ SOLN
INTRAMUSCULAR | Status: AC
  Filled 2020-01-09: qty 50

## 2020-01-09 MED ORDER — BELLADONNA ALKALOIDS-OPIUM 16.2-60 MG RE SUPP
1.0000 | Freq: Once | RECTAL | Status: AC
  Administered 2020-01-09: 17:00:00 1 via RECTAL
  Filled 2020-01-09: qty 1

## 2020-01-09 MED ORDER — BELLADONNA ALKALOIDS-OPIUM 16.2-30 MG RE SUPP: 1.0000 | Freq: Three times a day (TID) | RECTAL | 0 refills | Status: DC | PRN

## 2020-01-09 MED ORDER — HYDROCORTISONE ACETATE 25 MG RE SUPP
25.0000 mg | Freq: Two times a day (BID) | RECTAL | 0 refills | Status: DC
Start: 2020-01-09 — End: 2020-02-09

## 2020-01-09 MED ORDER — FENTANYL CITRATE (PF) 100 MCG/2ML IJ SOLN
50.0000 ug | Freq: Once | INTRAMUSCULAR | Status: AC
  Administered 2020-01-09: 50 ug via INTRAVENOUS
  Filled 2020-01-09: qty 2

## 2020-01-09 MED ORDER — SODIUM CHLORIDE 0.9 % IV BOLUS
1000.0000 mL | Freq: Once | INTRAVENOUS | Status: AC
  Administered 2020-01-09: 13:00:00 1000 mL via INTRAVENOUS

## 2020-01-09 MED ORDER — IOHEXOL 300 MG/ML  SOLN
100.0000 mL | Freq: Once | INTRAMUSCULAR | Status: AC | PRN
  Administered 2020-01-09: 14:00:00 100 mL via INTRAVENOUS

## 2020-01-09 MED ORDER — BELLADONNA ALKALOIDS-OPIUM 16.2-30 MG RE SUPP: 1.0000 | Freq: Once | RECTAL | Status: DC

## 2020-01-09 NOTE — ED Triage Notes (Signed)
Per pt, states she has'nt had a BM in 2 days-states she is unable to pass gas-states she went to [facility] and they said pain was caused by diverticulitis-GI told her to come here for eval

## 2020-01-09 NOTE — Discharge Instructions (Signed)
Use the suppository to decrease inflammation around the rectum to help you have more regular bowel movements. Make sure you stay well-hydrated water. Follow-up with your stomach doctor for further evaluation of your symptoms. Return to the emergency room with any new, worsening, concerning symptoms.

## 2020-01-09 NOTE — Telephone Encounter (Signed)
Patient called our answering service.  She complains of having severe right mid abdominal pain.  She is in tears, ports being in distress.  She stated she is having severe pain, no bowel movement for 2 to 3 days.  She has not passed gas from the rectum in more than 24 hours.  No nausea or vomiting.  Asked patient to go to the emergency room for further evaluation including labs and CT abdomen pelvis.  She does not want to go to [facility] as she had to wait too long in the ER in the past.  She will go to [facility] emergency room at this time.  She stated she will find a family member or friend to drive her to the emergency room.  I advised the patient if she is in severe distress if family cannot take her promptly than to call 911.

## 2020-01-09 NOTE — ED Provider Notes (Signed)
[facility] DEPT Provider Note   CSN: [id number] Arrival date & time: 01/09/20  1008     History Chief Complaint  Patient presents with  . Constipation  . Abdominal Cramping    Margaret Kelly is a 69 y.o. female presented for evaluation of abdominal cramping and constipation.  Patient states that the past 2 days, she has not been able to have a bowel movement or passed any gas.  She reports cramping of her right lower abdomen.  She states she had similar symptoms in March, had a colonoscopy at that time which showed a polyp polyp, but she states there are no other findings.  She has been taking MiraLAX, trying to stay hydrated, taking fiber without improvement.  She is taking Tylenol for pain without improvement.  Denies fevers, chills, nausea, vomiting.  She states she is not had any thing to eat or drink in 2 days, and when she does have a small sip of something this does not change her symptoms.  She denies sick contacts.  She denies urinary symptoms.  She has a previous history of an abdominal hysterectomy and appendectomy.  No other abdominal surgeries.  No history of previous bowel obstructions.  Patient follows with lower GI.  Upon chart review, patient's colonoscopy in 11-2019 showed diverticulosis, internal and external hemorrhoids, anal prolapse.  Additional history taken from chart review.  Patient with a history anxiety, GERD, history of hep C, hypertension, PAD.   HPI     Past Medical History:  Diagnosis Date  . Anxiety   . Blood transfusion without reported diagnosis 1982  . GERD (gastroesophageal reflux disease)   . History of hepatitis C 10/13/2015  . Hyperlipidemia LDL goal <100 10/13/2015  . Hypertension   . Muscle spasm of left lower extremity 10/13/2015  . Nerve damage 2001   left side as result of fall  . PAD (peripheral artery disease) ([place])   . Pain of left hip joint 02/21/2018  . Plantar wart     Patient Active Problem List    Diagnosis Date Noted  . Abnormal CT scan 10/09/2019  . Diverticulitis 10/09/2019  . Long term current use of anticoagulant therapy 10/09/2019  . Preop examination 10/09/2019  . Claudication in peripheral vascular disease ([place]) 08/03/2019  . Peripheral arterial disease ([place]) 03/20/2019  . Tobacco abuse counseling 11/12/2018  . Pain of left hip joint 02/21/2018  . Essential hypertension 10/13/2015  . Hyperlipidemia LDL goal <100 10/13/2015  . Muscle spasm of left lower extremity 10/13/2015  . History of hepatitis C 10/13/2015    Past Surgical History:  Procedure Laterality Date  . ABDOMINAL AORTOGRAM W/LOWER EXTREMITY Bilateral 03/26/2019   Procedure: ABDOMINAL AORTOGRAM W/LOWER EXTREMITY;  Surgeon: Lorretta Harp, MD;  Location: [facility];  Service: Cardiovascular;  Laterality: Bilateral;  . ABDOMINAL AORTOGRAM W/LOWER EXTREMITY Bilateral 08/03/2019   Procedure: ABDOMINAL AORTOGRAM W/LOWER EXTREMITY;  Surgeon: Lorretta Harp, MD;  Location: [facility];  Service: Cardiovascular;  Laterality: Bilateral;  . ABDOMINAL HYSTERECTOMY  1982  . CARPAL TUNNEL RELEASE Left 2005  . foot spur Left 2007   foot  . PERIPHERAL VASCULAR BALLOON ANGIOPLASTY Left 08/03/2019   Procedure: PERIPHERAL VASCULAR BALLOON ANGIOPLASTY;  Surgeon: Lorretta Harp, MD;  Location: [facility];  Service: Cardiovascular;  Laterality: Left;  SFA/ popliteal     OB History   No obstetric history on file.     Family History  Problem Relation Age of Onset  .  Colon cancer Neg Hx   . Esophageal cancer Neg Hx   . Rectal cancer Neg Hx   . Stomach cancer Neg Hx     Social History   Tobacco Use  . Smoking status: Current Every Day Smoker    Packs/day: 0.50    Types: Cigarettes  . Smokeless tobacco: Former Systems developer  . Tobacco comment: about half a pack per day  Substance Use Topics  . Alcohol use: Yes    Comment: rare  . Drug use: No    Home Medications Prior to Admission  medications   Medication Sig Start Date End Date Taking? Authorizing Provider  aspirin EC 81 MG tablet Take 81 mg by mouth daily.   Yes [provider]  carvedilol (COREG) 25 MG tablet TAKE 1 TABLET(25 MG) BY MOUTH TWICE DAILY Patient taking differently: Take 25 mg by mouth in the morning and at bedtime.  09/23/19  Yes Turner, Eber Hong, MD  cilostazol (PLETAL) 50 MG tablet TAKE 1 TABLET(50 MG) BY MOUTH TWICE DAILY Patient taking differently: Take 50 mg by mouth 2 (two) times daily.  10/12/19  Yes Lorretta Harp, MD  clopidogrel (PLAVIX) 75 MG tablet Take 1 tablet (75 mg total) by mouth daily with breakfast. 10/22/19  Yes Bhagat, Bhavinkumar, PA  cyclobenzaprine (FLEXERIL) 10 MG tablet Take 1 tablet (10 mg total) by mouth at bedtime as needed. Patient taking differently: Take 10 mg by mouth at bedtime as needed for muscle spasms.  12/16/19  Yes Dorena Dew, FNP  ezetimibe (ZETIA) 10 MG tablet TAKE 1 TABLET(10 MG) BY MOUTH DAILY Patient taking differently: Take 10 mg by mouth daily.  12/18/19  Yes Weaver, Scott T, PA-C  Fish Oil-Cholecalciferol (FISH OIL + D3) 1200-1000 MG-UNIT CAPS Take 1 capsule by mouth daily.   Yes [provider]  hydrochlorothiazide (HYDRODIURIL) 12.5 MG tablet Take 1 tablet (12.5 mg total) by mouth daily. 01/05/20  Yes Dorena Dew, FNP  losartan (COZAAR) 100 MG tablet TAKE 1 TABLET BY MOUTH DAILY Patient taking differently: Take 100 mg by mouth daily.  06/08/19  Yes Jegede, Marlena Clipper, MD  MAGNESIUM CITRATE PO Take 480 mg by mouth 2 (two) times a day.   Yes [provider]  Melatonin 12 MG TABS Take 12 mg by mouth at bedtime.   Yes [provider]  Multiple Vitamin (MULTIVITAMIN WITH MINERALS) TABS tablet Take 1 tablet by mouth daily. Women's One-A-Day    Yes [provider]  pantoprazole (PROTONIX) 40 MG tablet Take 1 tablet (40 mg total) by mouth daily. 12/07/19  Yes Dorena Dew, FNP  rosuvastatin (CRESTOR) 10 MG  tablet Take 10 mg by mouth at bedtime. 01/05/20  Yes [provider]  acetaminophen-codeine (TYLENOL #3) 300-30 MG tablet Take 1 tablet by mouth every 6 (six) hours as needed for moderate pain. Patient not taking: Reported on 10/06/2019 09/25/19   Dorena Dew, FNP  belladonna-opium (B&O SUPPRETTES) 16.2-30 MG suppository Place 1 suppository rectally every 8 (eight) hours as needed for pain. 01/09/20   Shaine Newmark, PA-C  hyoscyamine (LEVSIN SL) 0.125 MG SL tablet Place 1 tablet (0.125 mg total) under the tongue every 6 (six) hours as needed. Patient not taking: Reported on 01/09/2020 10/09/19   Zehr, Laban Emperor, PA-C  varenicline (CHANTIX CONTINUING MONTH PAK) 1 MG tablet Take 1 tablet (1 mg total) by mouth 2 (two) times daily. Patient not taking: Reported on 01/09/2020 08/24/19   [name], MD  varenicline ([place]  MONTH PAK) 0.5 MG X 11 & 1 MG X 42 tablet Take one 0.5 mg tablet by mouth once daily for 3 days, then increase to one 0.5 mg tablet twice daily for 4 days, then increase to one 1 mg tablet twice daily. Patient not taking: Reported on 01/05/2020 08/24/19   [name], MD    Allergies    Amlodipine  Review of Systems   Review of Systems  Gastrointestinal: Positive for abdominal pain and constipation.  All other systems reviewed and are negative.   Physical Exam Updated Vital Signs BP (!) 145/86   Pulse 63   Temp (!) 97.4 F (36.3 C) (Oral)   Resp 16   SpO2 98%   Physical Exam Vitals and nursing note reviewed. Exam conducted with a chaperone present.  Constitutional:      General: She is not in acute distress.    Appearance: She is well-developed.     Comments: Appears uncomfortable due to pain, otherwise nontoxic  HENT:     Head: Normocephalic and atraumatic.  Eyes:     Extraocular Movements: Extraocular movements intact.     Conjunctiva/sclera: Conjunctivae normal.     Pupils: Pupils are equal, round, and reactive to light.  Cardiovascular:      Rate and Rhythm: Normal rate and regular rhythm.     Pulses: Normal pulses.  Pulmonary:     Effort: Pulmonary effort is normal. No respiratory distress.     Breath sounds: Normal breath sounds. No wheezing.  Abdominal:     General: Bowel sounds are normal. There is no distension.     Palpations: Abdomen is soft. There is no mass.     Tenderness: There is abdominal tenderness. There is no guarding or rebound.     Comments: Diffuse tenderness palpation of the abdomen.  No rigidity, guarding, distention.  Negative rebound.  No peritonitis.  Genitourinary:    Rectum: External hemorrhoid and internal hemorrhoid present. No tenderness.     Comments: Inflamed/swollen tissue circumferentially around the rectum, question hemorrhoids versus condyloma.  No firmness or signs of thrombosis.  No active bleeding.  No significant pain on rectal exam.  No stool/fecal impaction noted. Musculoskeletal:        General: Normal range of motion.     Cervical back: Normal range of motion and neck supple.  Skin:    General: Skin is warm and dry.     Capillary Refill: Capillary refill takes less than 2 seconds.  Neurological:     Mental Status: She is alert and oriented to person, place, and time.     ED Results / Procedures / Treatments   Labs (all labs ordered are listed, but only abnormal results are displayed) Labs Reviewed  COMPREHENSIVE METABOLIC PANEL - Abnormal; Notable for the following components:      Result Value   Glucose, Bld 156 (*)    BUN 27 (*)    All other components within normal limits  CBC - Abnormal; Notable for the following components:   WBC 10.8 (*)    Hemoglobin 16.1 (*)    HCT 47.3 (*)    All other components within normal limits  LIPASE, BLOOD  URINALYSIS, ROUTINE W REFLEX MICROSCOPIC    EKG None  Radiology CT ABDOMEN PELVIS W CONTRAST  Result Date: 01/09/2020 CLINICAL DATA:  No bowel movement in 2 days. Abdominal pain. EXAM: CT ABDOMEN AND PELVIS WITH CONTRAST  TECHNIQUE: Multidetector CT imaging of the abdomen and pelvis was performed using the standard  protocol following bolus administration of intravenous contrast. CONTRAST:  117mL OMNIPAQUE IOHEXOL 300 MG/ML  SOLN COMPARISON:  June 19, 2019 FINDINGS: Lower chest: No acute abnormality. Hepatobiliary: No focal liver abnormality is seen. No gallstones, gallbladder wall thickening, or biliary dilatation. Pancreas: Unremarkable. No pancreatic ductal dilatation or surrounding inflammatory changes. Spleen: Normal in size without focal abnormality. Adrenals/Urinary Tract: Adrenal glands are unremarkable. Kidneys are normal, without renal calculi, focal lesion, or hydronephrosis. Bladder is unremarkable. Stomach/Bowel: Stomach is within normal limits. No evidence of small bowel wall thickening, distention, or inflammatory changes. Average stool burden throughout the colon which is normal in caliber. Nonspecific thickening of the distal rectum/anus. Vascular/Lymphatic: Aortic atherosclerosis. No enlarged abdominal or pelvic lymph nodes. Reproductive: Status post hysterectomy. No adnexal masses. Other: No abdominal wall hernia or abnormality. No abdominopelvic ascites. Musculoskeletal: Spondylosis of the lumbosacral spine. IMPRESSION: 1. No evidence of acute abnormalities within the solid abdominal organs. 2. Average stool burden throughout the colon. 3. Nonspecific thickening of the distal rectum/anus. This may represent a soft tissue mass or internal hemorrhoids. Please correlate to physical exam findings. Aortic Atherosclerosis (ICD10-I70.0). Electronically Signed   By: Fidela Salisbury M.D.   On: 01/09/2020 14:07    Procedures Procedures (including critical care time)  Medications Ordered in ED Medications  sodium chloride (PF) 0.9 % injection (has no administration in time range)  opium-belladonna (B&O) suppository 16.2-60mg  (has no administration in time range)  fentaNYL (SUBLIMAZE) injection 50 mcg (50 mcg  Intravenous Given 01/09/20 1314)  sodium chloride 0.9 % bolus 1,000 mL (0 mLs Intravenous Stopped 01/09/20 1501)  iohexol (OMNIPAQUE) 300 MG/ML solution 100 mL (100 mLs Intravenous Contrast Given 01/09/20 1343)    ED Course  I have reviewed the triage vital signs and the nursing notes.  Pertinent labs & imaging results that were available during my care of the patient were reviewed by me and considered in my medical decision making (see chart for details).    MDM Rules/Calculators/A&P                      Patient resenting for evaluation constipation, no gas movement, abdominal cramping.  On exam, she appears uncomfortable, but otherwise nontoxic.  Diffuse abdominal tenderness.  Considering history, concern for bowel obstruction, although patient does have good bowel sounds.  Will obtain labs and CT abdomen pelvis.  Also consider constipation causing pain versus mass versus diverticulitis.  Labs interpreted by me, overall reassuring.  Mild leukocytosis of 10.8, nonspecific.  Electrolytes stable.  Kidney and liver and pancreatic function normal.  CT abdomen pelvis shows inflammation of the rectum.  On exam, patient has inflammation, but no signs of thrombosis or mass.  No significant pain on rectal exam.  Case discussed with attending, Dr. Vanita Panda evaluated the patient, including a rectal exam.  Consider hemorrhoids versus condyloma.  Will treat with belladonna suppositories and have patient follow-up closely with GI.  Will have patient continue bowel regimen.  At this time, patient appears safe for discharge.  Return precautions given.  Patient states she understands and agrees to plan.  Final Clinical Impression(s) / ED Diagnoses Final diagnoses:  Constipation, unspecified constipation type  Rectal inflammation    Rx / DC Orders ED Discharge Orders         Ordered    belladonna-opium (B&O SUPPRETTES) 16.2-30 MG suppository  Every 8 hours PRN     01/09/20 [place],  Mateen Franssen, PA-C 01/09/20 1621  Carmin Muskrat, MD 01/09/20 1724    Carmin Muskrat, MD 01/09/20 [phone]

## 2020-01-10 ENCOUNTER — Telehealth: Payer: Self-pay | Admitting: Nurse Practitioner

## 2020-01-10 NOTE — Telephone Encounter (Signed)
Margaret Kelly, pls see phone notes over the weekend and ER eval on 5/8.  ER eval as follows: CT abdomen pelvis shows inflammation of the rectum.  On exam, patient has inflammation, but no signs of thrombosis or mass.  No significant pain on rectal exam.  Case discussed with attending, Dr. Vanita Panda evaluated the patient, including a rectal exam.  Consider hemorrhoids versus condyloma.  Will treat with belladonna suppositories and have patient follow-up closely with GI.  Will have patient continue bowel regimen.  At this time, patient appears safe for discharge. Patient called on call service Sunday 5/9. She was unable to obtain the suppositories at her pharmacy. She complains of cramping abdominal pain not rectal pain. She has passed gas today, no BM. I advised the pt to take Miralax bid, Dulcolax suppository 1 bid and Gas X 1 bid. Please call the patient Monday 5/10 for appropriate follow up. Thx.

## 2020-01-11 ENCOUNTER — Telehealth: Payer: Self-pay | Admitting: Gastroenterology

## 2020-01-11 NOTE — Telephone Encounter (Signed)
Agree with urgent ED evaluation d/t severity of pain.

## 2020-01-11 NOTE — Telephone Encounter (Signed)
The pt was advised during previous call.

## 2020-01-11 NOTE — Telephone Encounter (Signed)
I spoke with the pt and she is audibly in tears and panting.  She says she is in severe pain 8/10 in the lower abd going down her right leg and side.  She has had a bowel movement that was normal for her.  She tried tylenol 650 mg with no relief.   She was very hard to get answers from due to the amount of crying and panting.  She states she was in the ED on 5/8 and was prescribed suppositories for rectal inflammation.  She reports no pain in the rectum.  I advised her to go to the ED due to the severity of the pain.  Dr Fuller Plan please advise

## 2020-01-13 ENCOUNTER — Inpatient Hospital Stay (HOSPITAL_COMMUNITY)
Admission: EM | Admit: 2020-01-13 | Discharge: 2020-01-22 | DRG: 329 | Disposition: A | Discharge status: Home / Self Care | Payer: Medicare Other | Attending: Physician Assistant | Admitting: Physician Assistant

## 2020-01-13 ENCOUNTER — Other Ambulatory Visit: Payer: Self-pay

## 2020-01-13 DIAGNOSIS — Z90710 Acquired absence of both cervix and uterus: Secondary | ICD-10-CM

## 2020-01-13 DIAGNOSIS — R1084 Generalized abdominal pain: Secondary | ICD-10-CM | POA: Diagnosis not present

## 2020-01-13 DIAGNOSIS — T148XXA Other injury of unspecified body region, initial encounter: Secondary | ICD-10-CM | POA: Diagnosis present

## 2020-01-13 DIAGNOSIS — E876 Hypokalemia: Secondary | ICD-10-CM | POA: Diagnosis not present

## 2020-01-13 DIAGNOSIS — K562 Volvulus: Secondary | ICD-10-CM | POA: Diagnosis not present

## 2020-01-13 DIAGNOSIS — M25552 Pain in left hip: Secondary | ICD-10-CM | POA: Diagnosis present

## 2020-01-13 DIAGNOSIS — I10 Essential (primary) hypertension: Secondary | ICD-10-CM | POA: Diagnosis not present

## 2020-01-13 DIAGNOSIS — Z743 Need for continuous supervision: Secondary | ICD-10-CM | POA: Diagnosis not present

## 2020-01-13 DIAGNOSIS — K630 Abscess of intestine: Secondary | ICD-10-CM | POA: Diagnosis present

## 2020-01-13 DIAGNOSIS — Z7902 Long term (current) use of antithrombotics/antiplatelets: Secondary | ICD-10-CM

## 2020-01-13 DIAGNOSIS — Z79899 Other long term (current) drug therapy: Secondary | ICD-10-CM

## 2020-01-13 DIAGNOSIS — K5900 Constipation, unspecified: Secondary | ICD-10-CM | POA: Diagnosis not present

## 2020-01-13 DIAGNOSIS — K56609 Unspecified intestinal obstruction, unspecified as to partial versus complete obstruction: Secondary | ICD-10-CM | POA: Diagnosis present

## 2020-01-13 DIAGNOSIS — Z5189 Encounter for other specified aftercare: Secondary | ICD-10-CM | POA: Diagnosis not present

## 2020-01-13 DIAGNOSIS — K55049 Acute infarction of large intestine, extent unspecified: Secondary | ICD-10-CM | POA: Diagnosis present

## 2020-01-13 DIAGNOSIS — Z8619 Personal history of other infectious and parasitic diseases: Secondary | ICD-10-CM | POA: Diagnosis not present

## 2020-01-13 DIAGNOSIS — Z7982 Long term (current) use of aspirin: Secondary | ICD-10-CM | POA: Diagnosis not present

## 2020-01-13 DIAGNOSIS — K55039 Acute (reversible) ischemia of large intestine, extent unspecified: Secondary | ICD-10-CM | POA: Diagnosis present

## 2020-01-13 DIAGNOSIS — T85698A Other mechanical complication of other specified internal prosthetic devices, implants and grafts, initial encounter: Secondary | ICD-10-CM | POA: Diagnosis not present

## 2020-01-13 DIAGNOSIS — D649 Anemia, unspecified: Secondary | ICD-10-CM | POA: Diagnosis not present

## 2020-01-13 DIAGNOSIS — R188 Other ascites: Secondary | ICD-10-CM | POA: Diagnosis not present

## 2020-01-13 DIAGNOSIS — Z20822 Contact with and (suspected) exposure to covid-19: Secondary | ICD-10-CM | POA: Diagnosis not present

## 2020-01-13 DIAGNOSIS — Z7901 Long term (current) use of anticoagulants: Secondary | ICD-10-CM

## 2020-01-13 DIAGNOSIS — R109 Unspecified abdominal pain: Secondary | ICD-10-CM | POA: Diagnosis not present

## 2020-01-13 DIAGNOSIS — Y848 Other medical procedures as the cause of abnormal reaction of the patient, or of later complication, without mention of misadventure at the time of the procedure: Secondary | ICD-10-CM | POA: Diagnosis not present

## 2020-01-13 DIAGNOSIS — K219 Gastro-esophageal reflux disease without esophagitis: Secondary | ICD-10-CM | POA: Diagnosis not present

## 2020-01-13 DIAGNOSIS — Z03818 Encounter for observation for suspected exposure to other biological agents ruled out: Secondary | ICD-10-CM | POA: Diagnosis not present

## 2020-01-13 DIAGNOSIS — B1920 Unspecified viral hepatitis C without hepatic coma: Secondary | ICD-10-CM | POA: Diagnosis present

## 2020-01-13 DIAGNOSIS — K567 Ileus, unspecified: Secondary | ICD-10-CM | POA: Diagnosis not present

## 2020-01-13 DIAGNOSIS — E785 Hyperlipidemia, unspecified: Secondary | ICD-10-CM | POA: Diagnosis present

## 2020-01-13 DIAGNOSIS — I739 Peripheral vascular disease, unspecified: Secondary | ICD-10-CM | POA: Diagnosis present

## 2020-01-13 DIAGNOSIS — F17210 Nicotine dependence, cigarettes, uncomplicated: Secondary | ICD-10-CM | POA: Diagnosis present

## 2020-01-13 DIAGNOSIS — F419 Anxiety disorder, unspecified: Secondary | ICD-10-CM | POA: Diagnosis not present

## 2020-01-13 DIAGNOSIS — R112 Nausea with vomiting, unspecified: Secondary | ICD-10-CM | POA: Diagnosis not present

## 2020-01-13 LAB — CBC WITH DIFFERENTIAL/PLATELET
Abs Immature Granulocytes: 0.11 10*3/uL — ABNORMAL HIGH (ref 0.00–0.07)
Basophils Absolute: 0.1 10*3/uL (ref 0.0–0.1)
Basophils Relative: 0 %
Eosinophils Absolute: 0 10*3/uL (ref 0.0–0.5)
Eosinophils Relative: 0 %
HCT: 44.4 % (ref 36.0–46.0)
Hemoglobin: 15.6 g/dL — ABNORMAL HIGH (ref 12.0–15.0)
Immature Granulocytes: 0 %
Lymphocytes Relative: 7 %
Lymphs Abs: 1.7 10*3/uL (ref 0.7–4.0)
MCH: 33.1 pg (ref 26.0–34.0)
MCHC: 35.1 g/dL (ref 30.0–36.0)
MCV: 94.1 fL (ref 80.0–100.0)
Monocytes Absolute: 2.8 10*3/uL — ABNORMAL HIGH (ref 0.1–1.0)
Monocytes Relative: 11 %
Neutro Abs: 20.1 10*3/uL — ABNORMAL HIGH (ref 1.7–7.7)
Neutrophils Relative %: 82 %
Platelets: 355 10*3/uL (ref 150–400)
RBC: 4.72 MIL/uL (ref 3.87–5.11)
RDW: 11.9 % (ref 11.5–15.5)
WBC: 24.8 10*3/uL — ABNORMAL HIGH (ref 4.0–10.5)
nRBC: 0 % (ref 0.0–0.2)

## 2020-01-13 MED ORDER — ONDANSETRON HCL 4 MG/2ML IJ SOLN
4.0000 mg | Freq: Once | INTRAMUSCULAR | Status: AC
  Administered 2020-01-13: 4 mg via INTRAVENOUS
  Filled 2020-01-13: qty 2

## 2020-01-13 MED ORDER — HYDROMORPHONE HCL 1 MG/ML IJ SOLN
0.5000 mg | Freq: Once | INTRAMUSCULAR | Status: AC
  Administered 2020-01-13: 0.5 mg via INTRAVENOUS
  Filled 2020-01-13: qty 1

## 2020-01-13 MED ORDER — SODIUM CHLORIDE 0.9 % IV BOLUS
1000.0000 mL | Freq: Once | INTRAVENOUS | Status: AC
  Administered 2020-01-13: 1000 mL via INTRAVENOUS

## 2020-01-13 NOTE — ED Triage Notes (Signed)
Pt reports abd pain onset x 8 days lower left quad that radiates to midline

## 2020-01-13 NOTE — ED Notes (Signed)
Pt states that she can't urinate at this time.

## 2020-01-13 NOTE — ED Notes (Signed)
Pt aware that urine sample is needed.  

## 2020-01-14 ENCOUNTER — Emergency Department (HOSPITAL_COMMUNITY): Payer: Medicare Other | Admitting: Certified Registered"

## 2020-01-14 ENCOUNTER — Encounter (HOSPITAL_COMMUNITY): Payer: Self-pay | Admitting: Certified Registered Nurse Anesthetist

## 2020-01-14 ENCOUNTER — Emergency Department (HOSPITAL_COMMUNITY): Payer: Medicare Other

## 2020-01-14 ENCOUNTER — Emergency Department (HOSPITAL_COMMUNITY): Payer: Medicare Other | Admitting: Certified Registered Nurse Anesthetist

## 2020-01-14 ENCOUNTER — Encounter (HOSPITAL_COMMUNITY): Admission: EM | Disposition: A | Discharge status: Home / Self Care | Payer: Self-pay | Source: Home / Self Care

## 2020-01-14 ENCOUNTER — Encounter (HOSPITAL_COMMUNITY): Payer: Self-pay

## 2020-01-14 DIAGNOSIS — F17210 Nicotine dependence, cigarettes, uncomplicated: Secondary | ICD-10-CM | POA: Diagnosis present

## 2020-01-14 DIAGNOSIS — F419 Anxiety disorder, unspecified: Secondary | ICD-10-CM | POA: Diagnosis present

## 2020-01-14 DIAGNOSIS — Z5189 Encounter for other specified aftercare: Secondary | ICD-10-CM | POA: Diagnosis not present

## 2020-01-14 DIAGNOSIS — K562 Volvulus: Secondary | ICD-10-CM | POA: Diagnosis not present

## 2020-01-14 DIAGNOSIS — Z7982 Long term (current) use of aspirin: Secondary | ICD-10-CM | POA: Diagnosis not present

## 2020-01-14 DIAGNOSIS — Z90710 Acquired absence of both cervix and uterus: Secondary | ICD-10-CM | POA: Diagnosis not present

## 2020-01-14 DIAGNOSIS — R188 Other ascites: Secondary | ICD-10-CM | POA: Diagnosis not present

## 2020-01-14 DIAGNOSIS — K219 Gastro-esophageal reflux disease without esophagitis: Secondary | ICD-10-CM | POA: Diagnosis not present

## 2020-01-14 DIAGNOSIS — R109 Unspecified abdominal pain: Secondary | ICD-10-CM | POA: Diagnosis not present

## 2020-01-14 DIAGNOSIS — I739 Peripheral vascular disease, unspecified: Secondary | ICD-10-CM | POA: Diagnosis not present

## 2020-01-14 DIAGNOSIS — K55049 Acute infarction of large intestine, extent unspecified: Secondary | ICD-10-CM | POA: Diagnosis not present

## 2020-01-14 DIAGNOSIS — K56609 Unspecified intestinal obstruction, unspecified as to partial versus complete obstruction: Secondary | ICD-10-CM | POA: Diagnosis present

## 2020-01-14 DIAGNOSIS — M25552 Pain in left hip: Secondary | ICD-10-CM

## 2020-01-14 DIAGNOSIS — K630 Abscess of intestine: Secondary | ICD-10-CM | POA: Diagnosis not present

## 2020-01-14 DIAGNOSIS — B1920 Unspecified viral hepatitis C without hepatic coma: Secondary | ICD-10-CM | POA: Diagnosis present

## 2020-01-14 DIAGNOSIS — Y848 Other medical procedures as the cause of abnormal reaction of the patient, or of later complication, without mention of misadventure at the time of the procedure: Secondary | ICD-10-CM | POA: Diagnosis not present

## 2020-01-14 DIAGNOSIS — E785 Hyperlipidemia, unspecified: Secondary | ICD-10-CM | POA: Diagnosis not present

## 2020-01-14 DIAGNOSIS — R1084 Generalized abdominal pain: Secondary | ICD-10-CM | POA: Diagnosis not present

## 2020-01-14 DIAGNOSIS — Z20822 Contact with and (suspected) exposure to covid-19: Secondary | ICD-10-CM | POA: Diagnosis not present

## 2020-01-14 DIAGNOSIS — I10 Essential (primary) hypertension: Secondary | ICD-10-CM | POA: Diagnosis not present

## 2020-01-14 DIAGNOSIS — T85698A Other mechanical complication of other specified internal prosthetic devices, implants and grafts, initial encounter: Secondary | ICD-10-CM | POA: Diagnosis not present

## 2020-01-14 DIAGNOSIS — K55039 Acute (reversible) ischemia of large intestine, extent unspecified: Secondary | ICD-10-CM | POA: Diagnosis not present

## 2020-01-14 DIAGNOSIS — R112 Nausea with vomiting, unspecified: Secondary | ICD-10-CM

## 2020-01-14 DIAGNOSIS — T148XXA Other injury of unspecified body region, initial encounter: Secondary | ICD-10-CM

## 2020-01-14 DIAGNOSIS — Z79899 Other long term (current) drug therapy: Secondary | ICD-10-CM | POA: Diagnosis not present

## 2020-01-14 DIAGNOSIS — E876 Hypokalemia: Secondary | ICD-10-CM | POA: Diagnosis not present

## 2020-01-14 DIAGNOSIS — K567 Ileus, unspecified: Secondary | ICD-10-CM | POA: Diagnosis not present

## 2020-01-14 DIAGNOSIS — D649 Anemia, unspecified: Secondary | ICD-10-CM | POA: Diagnosis present

## 2020-01-14 DIAGNOSIS — Z8619 Personal history of other infectious and parasitic diseases: Secondary | ICD-10-CM | POA: Diagnosis not present

## 2020-01-14 DIAGNOSIS — Z7902 Long term (current) use of antithrombotics/antiplatelets: Secondary | ICD-10-CM | POA: Diagnosis not present

## 2020-01-14 HISTORY — PX: LAPAROTOMY: SHX154

## 2020-01-14 HISTORY — PX: FLEXIBLE SIGMOIDOSCOPY: SHX5431

## 2020-01-14 LAB — GLUCOSE, CAPILLARY
Glucose-Capillary: 141 mg/dL — ABNORMAL HIGH (ref 70–99)
Glucose-Capillary: 173 mg/dL — ABNORMAL HIGH (ref 70–99)
Glucose-Capillary: 189 mg/dL — ABNORMAL HIGH (ref 70–99)

## 2020-01-14 LAB — CBC
HCT: 41.3 % (ref 36.0–46.0)
Hemoglobin: 14.4 g/dL (ref 12.0–15.0)
MCH: 33.2 pg (ref 26.0–34.0)
MCHC: 34.9 g/dL (ref 30.0–36.0)
MCV: 95.2 fL (ref 80.0–100.0)
Platelets: 306 10*3/uL (ref 150–400)
RBC: 4.34 MIL/uL (ref 3.87–5.11)
RDW: 12 % (ref 11.5–15.5)
WBC: 26.8 10*3/uL — ABNORMAL HIGH (ref 4.0–10.5)
nRBC: 0 % (ref 0.0–0.2)

## 2020-01-14 LAB — COMPREHENSIVE METABOLIC PANEL
ALT: 17 U/L (ref 0–44)
ALT: 19 U/L (ref 0–44)
AST: 19 U/L (ref 15–41)
AST: 24 U/L (ref 15–41)
Albumin: 3.2 g/dL — ABNORMAL LOW (ref 3.5–5.0)
Albumin: 4.3 g/dL (ref 3.5–5.0)
Alkaline Phosphatase: 54 U/L (ref 38–126)
Alkaline Phosphatase: 64 U/L (ref 38–126)
Anion gap: 13 (ref 5–15)
Anion gap: 15 (ref 5–15)
BUN: 21 mg/dL (ref 8–23)
BUN: 25 mg/dL — ABNORMAL HIGH (ref 8–23)
CO2: 24 mmol/L (ref 22–32)
CO2: 25 mmol/L (ref 22–32)
Calcium: 8.7 mg/dL — ABNORMAL LOW (ref 8.9–10.3)
Calcium: 9.8 mg/dL (ref 8.9–10.3)
Chloride: 94 mmol/L — ABNORMAL LOW (ref 98–111)
Chloride: 95 mmol/L — ABNORMAL LOW (ref 98–111)
Creatinine, Ser: 0.83 mg/dL (ref 0.44–1.00)
Creatinine, Ser: 1.04 mg/dL — ABNORMAL HIGH (ref 0.44–1.00)
GFR calc Af Amer: >60 mL/min (ref >60)
GFR calc Af Amer: >60 mL/min (ref >60)
GFR calc non Af Amer: 56 mL/min — ABNORMAL LOW (ref >60)
GFR calc non Af Amer: >60 mL/min (ref >60)
Glucose, Bld: 233 mg/dL — ABNORMAL HIGH (ref 70–99)
Glucose, Bld: 247 mg/dL — ABNORMAL HIGH (ref 70–99)
Potassium: 3.6 mmol/L (ref 3.5–5.1)
Potassium: 3.7 mmol/L (ref 3.5–5.1)
Sodium: 132 mmol/L — ABNORMAL LOW (ref 135–145)
Sodium: 134 mmol/L — ABNORMAL LOW (ref 135–145)
Total Bilirubin: 1 mg/dL (ref 0.3–1.2)
Total Bilirubin: 1.4 mg/dL — ABNORMAL HIGH (ref 0.3–1.2)
Total Protein: 6.6 g/dL (ref 6.5–8.1)
Total Protein: 7.8 g/dL (ref 6.5–8.1)

## 2020-01-14 LAB — LIPASE, BLOOD: Lipase: 23 U/L (ref 11–51)

## 2020-01-14 LAB — ABO/RH: ABO/RH(D): A POS

## 2020-01-14 LAB — URINALYSIS, ROUTINE W REFLEX MICROSCOPIC
Bacteria, UA: NONE SEEN
Bilirubin Urine: NEGATIVE
Glucose, UA: NEGATIVE mg/dL
Hgb urine dipstick: NEGATIVE
Ketones, ur: 5 mg/dL — AB
Leukocytes,Ua: NEGATIVE
Nitrite: NEGATIVE
Protein, ur: 30 mg/dL — AB
Specific Gravity, Urine: 1.005 (ref 1.005–1.030)
pH: 5 (ref 5.0–8.0)

## 2020-01-14 LAB — PROTIME-INR
INR: 1.1 (ref 0.8–1.2)
Prothrombin Time: 13.9 seconds (ref 11.4–15.2)

## 2020-01-14 LAB — SARS CORONAVIRUS 2 BY RT PCR (HOSPITAL ORDER, PERFORMED IN ~~LOC~~ HOSPITAL LAB): SARS Coronavirus 2: NEGATIVE

## 2020-01-14 LAB — HEMOGLOBIN A1C
Hgb A1c MFr Bld: 6.8 % — ABNORMAL HIGH (ref 4.8–5.6)
Mean Plasma Glucose: 148.46 mg/dL

## 2020-01-14 LAB — HIV ANTIBODY (ROUTINE TESTING W REFLEX): HIV Screen 4th Generation wRfx: NONREACTIVE

## 2020-01-14 LAB — TYPE AND SCREEN
ABO/RH(D): A POS
Antibody Screen: NEGATIVE

## 2020-01-14 LAB — APTT: aPTT: 25 seconds (ref 24–36)

## 2020-01-14 LAB — LACTIC ACID, PLASMA: Lactic Acid, Venous: 1.6 mmol/L (ref 0.5–1.9)

## 2020-01-14 SURGERY — SIGMOIDOSCOPY, FLEXIBLE
Anesthesia: Monitor Anesthesia Care

## 2020-01-14 SURGERY — LAPAROTOMY, EXPLORATORY
Anesthesia: General | Site: Abdomen

## 2020-01-14 MED ORDER — SUCCINYLCHOLINE CHLORIDE 200 MG/10ML IV SOSY
PREFILLED_SYRINGE | INTRAVENOUS | Status: AC
  Filled 2020-01-14: qty 10

## 2020-01-14 MED ORDER — PROPOFOL 500 MG/50ML IV EMUL
INTRAVENOUS | Status: AC
  Filled 2020-01-14: qty 50

## 2020-01-14 MED ORDER — MIDAZOLAM HCL 2 MG/2ML IJ SOLN
INTRAMUSCULAR | Status: AC
  Filled 2020-01-14: qty 2

## 2020-01-14 MED ORDER — PROPOFOL 500 MG/50ML IV EMUL
INTRAVENOUS | Status: DC | PRN
  Administered 2020-01-14: 50 ug/kg/min via INTRAVENOUS

## 2020-01-14 MED ORDER — OXYCODONE HCL 5 MG/5ML PO SOLN: 5.0000 mg | Freq: Once | ORAL | Status: DC | PRN

## 2020-01-14 MED ORDER — PHENYLEPHRINE HCL-NACL 10-0.9 MG/250ML-% IV SOLN
INTRAVENOUS | Status: DC | PRN
  Administered 2020-01-14: 30 ug/min via INTRAVENOUS
  Administered 2020-01-14: 40 ug/min via INTRAVENOUS

## 2020-01-14 MED ORDER — PHENYLEPHRINE HCL (PRESSORS) 10 MG/ML IV SOLN
INTRAVENOUS | Status: AC
  Filled 2020-01-14: qty 1

## 2020-01-14 MED ORDER — LACTATED RINGERS IV SOLN: INTRAVENOUS | Status: DC | PRN

## 2020-01-14 MED ORDER — IOHEXOL 300 MG/ML  SOLN
100.0000 mL | Freq: Once | INTRAMUSCULAR | Status: AC | PRN
  Administered 2020-01-14: 100 mL via INTRAVENOUS

## 2020-01-14 MED ORDER — PHENYLEPHRINE 40 MCG/ML (10ML) SYRINGE FOR IV PUSH (FOR BLOOD PRESSURE SUPPORT)
PREFILLED_SYRINGE | INTRAVENOUS | Status: DC | PRN
  Administered 2020-01-14: 80 ug via INTRAVENOUS

## 2020-01-14 MED ORDER — FENTANYL CITRATE (PF) 100 MCG/2ML IJ SOLN
25.0000 ug | INTRAMUSCULAR | Status: DC | PRN
  Administered 2020-01-14 (×3): 50 ug via INTRAVENOUS

## 2020-01-14 MED ORDER — METOPROLOL TARTRATE 5 MG/5ML IV SOLN: 5.0000 mg | Freq: Three times a day (TID) | INTRAVENOUS | Status: DC | PRN

## 2020-01-14 MED ORDER — SODIUM CHLORIDE 0.9 % IV SOLN
2.0000 g | Freq: Once | INTRAVENOUS | Status: AC
  Administered 2020-01-14: 2 g via INTRAVENOUS
  Filled 2020-01-14: qty 20

## 2020-01-14 MED ORDER — PROPOFOL 10 MG/ML IV BOLUS
INTRAVENOUS | Status: AC
  Filled 2020-01-14: qty 20

## 2020-01-14 MED ORDER — FENTANYL CITRATE (PF) 100 MCG/2ML IJ SOLN
INTRAMUSCULAR | Status: AC
  Filled 2020-01-14: qty 2

## 2020-01-14 MED ORDER — ENOXAPARIN SODIUM 40 MG/0.4ML ~~LOC~~ SOLN
40.0000 mg | Freq: Once | SUBCUTANEOUS | Status: DC
  Filled 2020-01-14: qty 0.4

## 2020-01-14 MED ORDER — SODIUM CHLORIDE 0.9 % IV SOLN
INTRAVENOUS | Status: AC
  Filled 2020-01-14: qty 2

## 2020-01-14 MED ORDER — HYDROMORPHONE HCL 1 MG/ML IJ SOLN
INTRAMUSCULAR | Status: AC
  Filled 2020-01-14: qty 1

## 2020-01-14 MED ORDER — ESMOLOL HCL 100 MG/10ML IV SOLN
INTRAVENOUS | Status: AC
  Filled 2020-01-14: qty 10

## 2020-01-14 MED ORDER — ORAL CARE MOUTH RINSE
15.0000 mL | Freq: Two times a day (BID) | OROMUCOSAL | Status: DC
  Administered 2020-01-15 – 2020-01-22 (×10): 15 mL via OROMUCOSAL

## 2020-01-14 MED ORDER — FENTANYL CITRATE (PF) 250 MCG/5ML IJ SOLN
INTRAMUSCULAR | Status: DC | PRN
  Administered 2020-01-14 (×4): 50 ug via INTRAVENOUS

## 2020-01-14 MED ORDER — DIPHENHYDRAMINE HCL 12.5 MG/5ML PO ELIX
12.5000 mg | ORAL_SOLUTION | Freq: Four times a day (QID) | ORAL | Status: DC | PRN
  Filled 2020-01-14: qty 5

## 2020-01-14 MED ORDER — ACETAMINOPHEN 10 MG/ML IV SOLN
1000.0000 mg | Freq: Four times a day (QID) | INTRAVENOUS | Status: AC
  Administered 2020-01-14 – 2020-01-15 (×4): 1000 mg via INTRAVENOUS
  Filled 2020-01-14 (×4): qty 100

## 2020-01-14 MED ORDER — SUCCINYLCHOLINE CHLORIDE 200 MG/10ML IV SOSY
PREFILLED_SYRINGE | INTRAVENOUS | Status: DC | PRN
  Administered 2020-01-14: 80 mg via INTRAVENOUS

## 2020-01-14 MED ORDER — LORAZEPAM 2 MG/ML IJ SOLN
1.0000 mg | Freq: Four times a day (QID) | INTRAMUSCULAR | Status: DC | PRN
  Administered 2020-01-15 – 2020-01-18 (×5): 1 mg via INTRAVENOUS
  Administered 2020-01-19 – 2020-01-21 (×3): 2 mg via INTRAVENOUS
  Filled 2020-01-14 (×9): qty 1

## 2020-01-14 MED ORDER — CHLORHEXIDINE GLUCONATE CLOTH 2 % EX PADS
6.0000 | MEDICATED_PAD | Freq: Every day | CUTANEOUS | Status: DC
  Administered 2020-01-14 – 2020-01-17 (×4): 6 via TOPICAL

## 2020-01-14 MED ORDER — SUGAMMADEX SODIUM 200 MG/2ML IV SOLN
INTRAVENOUS | Status: DC | PRN
  Administered 2020-01-14: 140 mg via INTRAVENOUS

## 2020-01-14 MED ORDER — SODIUM CHLORIDE (PF) 0.9 % IJ SOLN
INTRAMUSCULAR | Status: AC
  Filled 2020-01-14: qty 50

## 2020-01-14 MED ORDER — OXYCODONE HCL 5 MG PO TABS: 5.0000 mg | ORAL_TABLET | Freq: Once | ORAL | Status: DC | PRN

## 2020-01-14 MED ORDER — ENOXAPARIN SODIUM 40 MG/0.4ML ~~LOC~~ SOLN
40.0000 mg | SUBCUTANEOUS | Status: DC
  Administered 2020-01-15 – 2020-01-22 (×8): 40 mg via SUBCUTANEOUS
  Filled 2020-01-14 (×8): qty 0.4

## 2020-01-14 MED ORDER — METHOCARBAMOL 1000 MG/10ML IJ SOLN
500.0000 mg | Freq: Four times a day (QID) | INTRAVENOUS | Status: DC | PRN
  Administered 2020-01-16 – 2020-01-17 (×5): 500 mg via INTRAVENOUS
  Filled 2020-01-14: qty 5
  Filled 2020-01-14 (×5): qty 500
  Filled 2020-01-14: qty 5

## 2020-01-14 MED ORDER — FENTANYL CITRATE (PF) 100 MCG/2ML IJ SOLN: 25.0000 ug | INTRAMUSCULAR | Status: DC | PRN

## 2020-01-14 MED ORDER — SODIUM CHLORIDE 0.9 % IV SOLN
2.0000 g | INTRAVENOUS | Status: AC
  Administered 2020-01-14: 2 g via INTRAVENOUS
  Filled 2020-01-14: qty 2

## 2020-01-14 MED ORDER — PROPOFOL 10 MG/ML IV BOLUS
INTRAVENOUS | Status: DC | PRN
  Administered 2020-01-14 (×4): 10 mg via INTRAVENOUS

## 2020-01-14 MED ORDER — ALBUMIN HUMAN 5 % IV SOLN: INTRAVENOUS | Status: DC | PRN

## 2020-01-14 MED ORDER — METRONIDAZOLE IN NACL 5-0.79 MG/ML-% IV SOLN
500.0000 mg | Freq: Once | INTRAVENOUS | Status: AC
  Administered 2020-01-14: 500 mg via INTRAVENOUS
  Filled 2020-01-14: qty 100

## 2020-01-14 MED ORDER — ROCURONIUM BROMIDE 10 MG/ML (PF) SYRINGE
PREFILLED_SYRINGE | INTRAVENOUS | Status: AC
  Filled 2020-01-14: qty 10

## 2020-01-14 MED ORDER — DEXAMETHASONE SODIUM PHOSPHATE 10 MG/ML IJ SOLN
INTRAMUSCULAR | Status: AC
  Filled 2020-01-14: qty 1

## 2020-01-14 MED ORDER — NICOTINE 21 MG/24HR TD PT24: 21.0000 mg | MEDICATED_PATCH | Freq: Every day | TRANSDERMAL | Status: DC | PRN

## 2020-01-14 MED ORDER — HYDROMORPHONE HCL 1 MG/ML IJ SOLN
1.0000 mg | Freq: Once | INTRAMUSCULAR | Status: AC
  Administered 2020-01-14: 1 mg via INTRAVENOUS
  Filled 2020-01-14: qty 1

## 2020-01-14 MED ORDER — BUPIVACAINE HCL 0.25 % IJ SOLN
INTRAMUSCULAR | Status: AC
  Filled 2020-01-14: qty 1

## 2020-01-14 MED ORDER — BUPIVACAINE LIPOSOME 1.3 % IJ SUSP
INTRAMUSCULAR | Status: DC | PRN
  Administered 2020-01-14: 20 mL

## 2020-01-14 MED ORDER — ROCURONIUM BROMIDE 10 MG/ML (PF) SYRINGE
PREFILLED_SYRINGE | INTRAVENOUS | Status: DC | PRN
  Administered 2020-01-14: 10 mg via INTRAVENOUS
  Administered 2020-01-14: 50 mg via INTRAVENOUS

## 2020-01-14 MED ORDER — ONDANSETRON HCL 4 MG/2ML IJ SOLN
4.0000 mg | Freq: Four times a day (QID) | INTRAMUSCULAR | Status: DC | PRN
  Administered 2020-01-14: 4 mg via INTRAVENOUS
  Filled 2020-01-14: qty 2

## 2020-01-14 MED ORDER — SODIUM CHLORIDE 0.9 % IV SOLN
12.5000 mg | INTRAVENOUS | Status: AC | PRN
  Filled 2020-01-14: qty 0.5

## 2020-01-14 MED ORDER — LACTATED RINGERS IV SOLN: INTRAVENOUS | Status: DC

## 2020-01-14 MED ORDER — 0.9 % SODIUM CHLORIDE (POUR BTL) OPTIME
TOPICAL | Status: DC | PRN
  Administered 2020-01-14: 3000 mL

## 2020-01-14 MED ORDER — PHENYLEPHRINE 40 MCG/ML (10ML) SYRINGE FOR IV PUSH (FOR BLOOD PRESSURE SUPPORT)
PREFILLED_SYRINGE | INTRAVENOUS | Status: DC | PRN
  Administered 2020-01-14: 120 ug via INTRAVENOUS
  Administered 2020-01-14: 40 ug via INTRAVENOUS
  Administered 2020-01-14 (×2): 80 ug via INTRAVENOUS

## 2020-01-14 MED ORDER — ONDANSETRON HCL 4 MG/2ML IJ SOLN: 4.0000 mg | Freq: Once | INTRAMUSCULAR | Status: DC | PRN

## 2020-01-14 MED ORDER — ESMOLOL HCL 100 MG/10ML IV SOLN
INTRAVENOUS | Status: DC | PRN
  Administered 2020-01-14: 20 mg via INTRAVENOUS
  Administered 2020-01-14: 30 mg via INTRAVENOUS

## 2020-01-14 MED ORDER — INSULIN ASPART 100 UNIT/ML ~~LOC~~ SOLN
0.0000 [IU] | SUBCUTANEOUS | Status: DC
  Administered 2020-01-14 (×2): 3 [IU] via SUBCUTANEOUS
  Administered 2020-01-15 (×2): 2 [IU] via SUBCUTANEOUS
  Administered 2020-01-17: 3 [IU] via SUBCUTANEOUS
  Administered 2020-01-17: 2 [IU] via SUBCUTANEOUS
  Administered 2020-01-18: 3 [IU] via SUBCUTANEOUS
  Administered 2020-01-18 (×2): 2 [IU] via SUBCUTANEOUS
  Administered 2020-01-19 (×3): 3 [IU] via SUBCUTANEOUS

## 2020-01-14 MED ORDER — VASOPRESSIN 20 UNIT/ML IV SOLN
INTRAVENOUS | Status: AC
  Filled 2020-01-14: qty 1

## 2020-01-14 MED ORDER — FENTANYL CITRATE (PF) 250 MCG/5ML IJ SOLN
INTRAMUSCULAR | Status: AC
  Filled 2020-01-14: qty 5

## 2020-01-14 MED ORDER — DEXAMETHASONE SODIUM PHOSPHATE 10 MG/ML IJ SOLN
INTRAMUSCULAR | Status: DC | PRN
  Administered 2020-01-14: 4 mg via INTRAVENOUS

## 2020-01-14 MED ORDER — PROPOFOL 10 MG/ML IV BOLUS
INTRAVENOUS | Status: DC | PRN
  Administered 2020-01-14: 140 mg via INTRAVENOUS

## 2020-01-14 MED ORDER — LIDOCAINE 2% (20 MG/ML) 5 ML SYRINGE
INTRAMUSCULAR | Status: DC | PRN
  Administered 2020-01-14: 60 mg via INTRAVENOUS

## 2020-01-14 MED ORDER — PIPERACILLIN-TAZOBACTAM 3.375 G IVPB
3.3750 g | Freq: Three times a day (TID) | INTRAVENOUS | Status: AC
  Administered 2020-01-14 – 2020-01-19 (×15): 3.375 g via INTRAVENOUS
  Filled 2020-01-14 (×15): qty 50

## 2020-01-14 MED ORDER — LIDOCAINE 2% (20 MG/ML) 5 ML SYRINGE
INTRAMUSCULAR | Status: DC | PRN
  Administered 2020-01-14: 40 mg via INTRAVENOUS

## 2020-01-14 MED ORDER — SODIUM CHLORIDE 0.9 % IV SOLN
12.5000 mg | Freq: Four times a day (QID) | INTRAVENOUS | Status: DC | PRN
  Filled 2020-01-14: qty 0.5

## 2020-01-14 MED ORDER — ONDANSETRON HCL 4 MG/2ML IJ SOLN
INTRAMUSCULAR | Status: AC
  Filled 2020-01-14: qty 2

## 2020-01-14 MED ORDER — ONDANSETRON HCL 4 MG/2ML IJ SOLN
INTRAMUSCULAR | Status: DC | PRN
  Administered 2020-01-14: 4 mg via INTRAVENOUS

## 2020-01-14 MED ORDER — ONDANSETRON 4 MG PO TBDP
4.0000 mg | ORAL_TABLET | Freq: Four times a day (QID) | ORAL | Status: DC | PRN
  Filled 2020-01-14: qty 1

## 2020-01-14 MED ORDER — ALVIMOPAN 12 MG PO CAPS
12.0000 mg | ORAL_CAPSULE | ORAL | Status: DC
  Filled 2020-01-14: qty 1

## 2020-01-14 MED ORDER — PANTOPRAZOLE SODIUM 40 MG IV SOLR
40.0000 mg | Freq: Every day | INTRAVENOUS | Status: DC
  Administered 2020-01-14 – 2020-01-21 (×8): 40 mg via INTRAVENOUS
  Filled 2020-01-14 (×8): qty 40

## 2020-01-14 MED ORDER — BUPIVACAINE LIPOSOME 1.3 % IJ SUSP
20.0000 mL | Freq: Once | INTRAMUSCULAR | Status: AC
  Filled 2020-01-14: qty 20

## 2020-01-14 MED ORDER — FENTANYL CITRATE (PF) 100 MCG/2ML IJ SOLN
25.0000 ug | Freq: Once | INTRAMUSCULAR | Status: AC
  Administered 2020-01-14: 25 ug via INTRAVENOUS

## 2020-01-14 MED ORDER — DIPHENHYDRAMINE HCL 50 MG/ML IJ SOLN: 12.5000 mg | Freq: Four times a day (QID) | INTRAMUSCULAR | Status: DC | PRN

## 2020-01-14 MED ORDER — HYDROMORPHONE HCL 1 MG/ML IJ SOLN
1.0000 mg | INTRAMUSCULAR | Status: DC | PRN
  Administered 2020-01-14 – 2020-01-15 (×6): 1 mg via INTRAVENOUS
  Filled 2020-01-14 (×5): qty 1

## 2020-01-14 MED ORDER — BUPIVACAINE HCL (PF) 0.25 % IJ SOLN
INTRAMUSCULAR | Status: DC | PRN
  Administered 2020-01-14: 50 mL

## 2020-01-14 SURGICAL SUPPLY — 51 items
BLADE EXTENDED COATED 6.5IN (ELECTRODE) IMPLANT
BLADE HEX COATED 2.75 (ELECTRODE) IMPLANT
CHLORAPREP W/TINT 26 (MISCELLANEOUS) ×3 IMPLANT
COUNTER NEEDLE 20 DBL MAG RED (NEEDLE) ×3 IMPLANT
COVER MAYO STAND STRL (DRAPES) ×1 IMPLANT
COVER SURGICAL LIGHT HANDLE (MISCELLANEOUS) ×3 IMPLANT
COVER WAND RF STERILE (DRAPES) IMPLANT
DRAIN CHANNEL 19F RND (DRAIN) IMPLANT
DRAPE LAPAROSCOPIC ABDOMINAL (DRAPES) ×1 IMPLANT
DRAPE SHEET LG 3/4 BI-LAMINATE (DRAPES) ×3 IMPLANT
DRAPE UTILITY XL STRL (DRAPES) ×6 IMPLANT
DRAPE WARM FLUID 44X44 (DRAPES) ×1 IMPLANT
DRSG OPSITE POSTOP 4X10 (GAUZE/BANDAGES/DRESSINGS) IMPLANT
DRSG OPSITE POSTOP 4X6 (GAUZE/BANDAGES/DRESSINGS) IMPLANT
DRSG OPSITE POSTOP 4X8 (GAUZE/BANDAGES/DRESSINGS) IMPLANT
DRSG VAC ATS MED SENSATRAC (GAUZE/BANDAGES/DRESSINGS) ×2 IMPLANT
ELECT REM PT RETURN 15FT ADLT (MISCELLANEOUS) ×3 IMPLANT
GAUZE SPONGE 4X4 12PLY STRL (GAUZE/BANDAGES/DRESSINGS) ×3 IMPLANT
GLOVE ECLIPSE 8.0 STRL XLNG CF (GLOVE) ×6 IMPLANT
GLOVE INDICATOR 8.0 STRL GRN (GLOVE) ×6 IMPLANT
GOWN STRL REUS W/TWL XL LVL3 (GOWN DISPOSABLE) ×6 IMPLANT
HANDLE SUCTION POOLE (INSTRUMENTS) ×1 IMPLANT
KIT BASIN (CUSTOM PROCEDURE TRAY) ×3 IMPLANT
KIT TURNOVER KIT A (KITS) IMPLANT
LEGGING LITHOTOMY PAIR STRL (DRAPES) IMPLANT
PAD POSITIONING PINK XL (MISCELLANEOUS) ×3 IMPLANT
PENCIL SMOKE EVACUATOR (MISCELLANEOUS) IMPLANT
RELOAD AUTO 90-3.5 TA90 BLE (ENDOMECHANICALS) ×3 IMPLANT
RELOAD STAPLE 90 BLU REG (ENDOMECHANICALS) IMPLANT
STAPLER PROXIMATE 75MM BLUE (STAPLE) ×2 IMPLANT
STAPLER VISISTAT 35W (STAPLE) ×1 IMPLANT
SUCTION POOLE HANDLE (INSTRUMENTS) ×3
SUT MNCRL AB 4-0 PS2 18 (SUTURE) IMPLANT
SUT PDS AB 1 CTX 36 (SUTURE) IMPLANT
SUT PDS AB 1 TP1 96 (SUTURE) ×4 IMPLANT
SUT PROLENE 2 0 SH DA (SUTURE) IMPLANT
SUT SILK 2 0 (SUTURE) ×2
SUT SILK 2 0 SH CR/8 (SUTURE) ×3 IMPLANT
SUT SILK 2-0 18XBRD TIE 12 (SUTURE) ×1 IMPLANT
SUT SILK 3 0 (SUTURE) ×2
SUT SILK 3 0 SH CR/8 (SUTURE) ×3 IMPLANT
SUT SILK 3-0 18XBRD TIE 12 (SUTURE) ×1 IMPLANT
SUT VIC AB 2-0 SH 18 (SUTURE) ×4 IMPLANT
SUT VICRYL 0 UR6 27IN ABS (SUTURE) IMPLANT
SUT VLOC 180 2-0 9IN GS21 (SUTURE) ×2 IMPLANT
TAPE UMBILICAL COTTON 1/8X30 (MISCELLANEOUS) ×3 IMPLANT
TOWEL OR 17X26 10 PK STRL BLUE (TOWEL DISPOSABLE) ×4 IMPLANT
TOWEL OR NON WOVEN STRL DISP B (DISPOSABLE) ×6 IMPLANT
TRAY COLON PACK (CUSTOM PROCEDURE TRAY) ×2 IMPLANT
TRAY FOLEY MTR SLVR 16FR STAT (SET/KITS/TRAYS/PACK) ×3 IMPLANT
YANKAUER SUCT BULB TIP 10FT TU (MISCELLANEOUS) ×3 IMPLANT

## 2020-01-14 NOTE — Anesthesia Postprocedure Evaluation (Signed)
Anesthesia Post Note  Patient: Margaret Kelly  Procedure(s) Performed: FLEXIBLE SIGMOIDOSCOPY (N/A )     Patient location during evaluation: PACU Anesthesia Type: MAC Level of consciousness: awake and alert Pain management: pain level controlled Vital Signs Assessment: post-procedure vital signs reviewed and stable Respiratory status: spontaneous breathing, nonlabored ventilation, respiratory function stable and patient connected to nasal cannula oxygen Cardiovascular status: stable and blood pressure returned to baseline Postop Assessment: no apparent nausea or vomiting Anesthetic complications: no    Last Vitals:  Vitals:   01/14/20 0930 01/14/20 1006  BP: (!) 149/79 (!) 143/85  Pulse: (!) 106 89  Resp: (!) 36 (!) 22  Temp:    SpO2: 91% 96%    Last Pain:  Vitals:   01/14/20 0930  TempSrc:   PainSc: 5        To [facility] main OR for Laparotomy.          [place]

## 2020-01-14 NOTE — H&P (Addendum)
Attested      Attestation signed by Michael Boston, MD at 01/14/2020 9:24 AM  Medically fragile patient with cirrhosis and peripheral disease with volvulus.  Agree with attempted endoscopic decompression.  Dr. [name] to do.  If that fails, may require resection later today.  May have to be a Hartmann resection in this emergent setting.  Surgery will follow closely.    Expand AllCollapse All   Show:Clear all [] Manual[] Template[] Copied  Added by: [x] Earnstine Regal, PA-C  [] Hover for details [facility] Surgery Consult Note   Margaret Kelly 1950/09/22  [place]:[id number].     Requesting MD: Mariel Aloe Chief Complaint: Abdominal pain x8 days Reason for Consult: Sigmoid volvulus GI:  Dr. Lucio Edward   HPI:  Patient is a 69 year old female with abdominal pain for 8 days.  She was seen on 01/09/2020 with complaints of constipation.  CT scan at that time showed no acute abnormalities.  There was nonspecific thickening of the distal rectum/anus which might represent soft tissue mass or internal hemorrhoids.  There was no acute abnormalities and there was an average stool burden throughout the colon.  Patient was treated with suppositories and discharge. Patient reported she continued to have progressive pain.  She is taken Tylenol without any relief.  She has had some very small watery stools in the past 2 days after taking Dulcolax suppositories and continues to feel constipated.  She has had some nausea but no vomiting.  She was seen in February by her PCP and [facility] with abdominal pain she has a history of a 10 mm hyperplastic polyp that was removed in 2015, internal hemorrhoids, rectal prolapse and diverticulosis.  She was found to have mild focal inflammatory changes left lower quadrant near the descending/proximal sigmoid colon with mild colon wall thickening thought to be secondary to acute diverticulitis.  She was treated for diverticulitis and underwent colonoscopy on 11/10/2019,  by Dr. Lucio Edward.  Colonoscopy findings: Hemorrhoids and rectal prolapse were found on perianal exam. - A 8 mm polyp was found in the sigmoid colon. The polyp was sessile. The polyp was removed with a cold snare. Resection and retrieval were complete. - Multiple medium-mouthed diverticula were found in the left colon. There was no evidence of diverticular bleeding. - Internal hemorrhoids were found during retroflexion. The hemorrhoids were moderate and Grade I (internal hemorrhoids that do not prolapse). - The exam was otherwise without abnormality on direct and retroflexion views.   Work-up in the ED shows she is afebrile vital signs are stable.  Labs shows a sodium 134, potassium 3.7, chloride 94, glucose of 247, BUN of 25 creatinine 1.04 total bilirubin of 1.4 with the remaining LFTs normal.  WBC 24.8, hemoglobin 15.6, hematocrit 44.4, platelets 355,000.  Urinalysis is unremarkable, COVID is negative.  CT of the abdomen/pelvis with contrast shows groundglass opacities in the lingula could reflect an early pneumonia.  There is a large dilated loop of bowel within the central abdomen measuring 9 cm in diameter the appearance and location is consistent with a sigmoid volvulus.  There was no wall edema or pneumatosis and no bowel obstruction.  Dr. Therisa Doyne was also contacted and plan to see the patient in the a.m.   Past medical history significant for hypertension, hyperlipidemia, PAD on Plavix, chronic tobacco use, GERD, and hepatitis C.   ROS: Review of Systems  Unable to perform ROS: Severity of pain           Family History  Problem Relation Age of Onset  .  Colon cancer Neg Hx    . Esophageal cancer Neg Hx    . Rectal cancer Neg Hx    . Stomach cancer Neg Hx            Past Medical History:  Diagnosis Date  . Anxiety    . Blood transfusion without reported diagnosis 1982  . GERD (gastroesophageal reflux disease)    . History of hepatitis C 10/13/2015  . Hyperlipidemia LDL goal  <100 10/13/2015  . Hypertension    . Muscle spasm of left lower extremity 10/13/2015  . Nerve damage 2001    left side as result of fall  . PAD (peripheral artery disease) ([place])    . Pain of left hip joint 02/21/2018  . Plantar wart             Past Surgical History:  Procedure Laterality Date  . ABDOMINAL AORTOGRAM W/LOWER EXTREMITY Bilateral 03/26/2019    Procedure: ABDOMINAL AORTOGRAM W/LOWER EXTREMITY;  Surgeon: Lorretta Harp, MD;  Location: [facility];  Service: Cardiovascular;  Laterality: Bilateral;  . ABDOMINAL AORTOGRAM W/LOWER EXTREMITY Bilateral 08/03/2019    Procedure: ABDOMINAL AORTOGRAM W/LOWER EXTREMITY;  Surgeon: Lorretta Harp, MD;  Location: [facility];  Service: Cardiovascular;  Laterality: Bilateral;  . ABDOMINAL HYSTERECTOMY   1982  . CARPAL TUNNEL RELEASE Left 2005  . foot spur Left 2007    foot  . PERIPHERAL VASCULAR BALLOON ANGIOPLASTY Left 08/03/2019    Procedure: PERIPHERAL VASCULAR BALLOON ANGIOPLASTY;  Surgeon: Lorretta Harp, MD;  Location: [facility];  Service: Cardiovascular;  Laterality: Left;  SFA/ popliteal      Social History:  reports that she has been smoking cigarettes. She has been smoking about 0.50 packs per day. She has quit using smokeless tobacco. She reports current alcohol use. She reports that she does not use drugs.   Allergies:       Allergies  Allergen Reactions  . Amlodipine Other (See Comments)      Ringing in ears             Prior to Admission medications   Medication Sig Start Date End Date Taking? Authorizing Provider  acetaminophen (TYLENOL) 500 MG tablet Take 1,000 mg by mouth every 6 (six) hours as needed for mild pain.     Yes [provider]  aspirin EC 81 MG tablet Take 81 mg by mouth daily.     Yes [provider]  carvedilol (COREG) 25 MG tablet TAKE 1 TABLET(25 MG) BY MOUTH TWICE DAILY Patient taking differently: Take 25 mg by mouth in the morning and at bedtime.   09/23/19   Yes Turner, Eber Hong, MD  cilostazol (PLETAL) 50 MG tablet TAKE 1 TABLET(50 MG) BY MOUTH TWICE DAILY Patient taking differently: Take 50 mg by mouth 2 (two) times daily.  10/12/19   Yes Lorretta Harp, MD  clopidogrel (PLAVIX) 75 MG tablet Take 1 tablet (75 mg total) by mouth daily with breakfast. 10/22/19   Yes Bhagat, Bhavinkumar, PA  cyclobenzaprine (FLEXERIL) 10 MG tablet Take 1 tablet (10 mg total) by mouth at bedtime as needed. Patient taking differently: Take 10 mg by mouth at bedtime as needed for muscle spasms.  12/16/19   Yes Dorena Dew, FNP  ezetimibe (ZETIA) 10 MG tablet TAKE 1 TABLET(10 MG) BY MOUTH DAILY Patient taking differently: Take 10 mg by mouth daily.  12/18/19   Yes Weaver, Scott T, PA-C  Fish Oil-Cholecalciferol (FISH OIL + D3)  1200-1000 MG-UNIT CAPS Take 1 capsule by mouth daily.     Yes [provider]  losartan (COZAAR) 100 MG tablet TAKE 1 TABLET BY MOUTH DAILY Patient taking differently: Take 100 mg by mouth daily.  06/08/19   Yes Jegede, Marlena Clipper, MD  MAGNESIUM CITRATE PO Take 480 mg by mouth 2 (two) times a day.     Yes [provider]  Melatonin 12 MG TABS Take 12 mg by mouth at bedtime.     Yes [provider]  Multiple Vitamin (MULTIVITAMIN WITH MINERALS) TABS tablet Take 1 tablet by mouth daily. Women's One-A-Day      Yes [provider]  pantoprazole (PROTONIX) 40 MG tablet Take 1 tablet (40 mg total) by mouth daily. 12/07/19   Yes Dorena Dew, FNP  rosuvastatin (CRESTOR) 10 MG tablet Take 10 mg by mouth at bedtime. 01/05/20   Yes [provider]  acetaminophen-codeine (TYLENOL #3) 300-30 MG tablet Take 1 tablet by mouth every 6 (six) hours as needed for moderate pain. Patient not taking: Reported on 10/06/2019 09/25/19     Dorena Dew, FNP  belladonna-opium (B&O SUPPRETTES) 16.2-30 MG suppository Place 1 suppository rectally every 8 (eight) hours as needed for pain. 01/09/20     Caccavale, Sophia,  PA-C  hydrochlorothiazide (HYDRODIURIL) 12.5 MG tablet Take 1 tablet (12.5 mg total) by mouth daily. Patient not taking: Reported on 01/14/2020 01/05/20     Dorena Dew, FNP  hydrocortisone (ANUSOL-HC) 25 MG suppository Place 1 suppository (25 mg total) rectally 2 (two) times daily. 01/09/20     Carmin Muskrat, MD  hyoscyamine (LEVSIN SL) 0.125 MG SL tablet Place 1 tablet (0.125 mg total) under the tongue every 6 (six) hours as needed. Patient not taking: Reported on 01/09/2020 10/09/19     Zehr, Laban Emperor, PA-C  varenicline (CHANTIX CONTINUING MONTH PAK) 1 MG tablet Take 1 tablet (1 mg total) by mouth 2 (two) times daily. Patient not taking: Reported on 01/09/2020 08/24/19     [name], MD  varenicline (CHANTIX STARTING MONTH PAK) 0.5 MG X 11 & 1 MG X 42 tablet Take one 0.5 mg tablet by mouth once daily for 3 days, then increase to one 0.5 mg tablet twice daily for 4 days, then increase to one 1 mg tablet twice daily. Patient not taking: Reported on 01/05/2020 08/24/19     [name], MD        Blood pressure (!) 141/68, pulse 87, temperature 97.9 F (36.6 C), resp. rate 19, SpO2 97 %. Physical Exam General:elderly appearing WF in acute distress and unremitting pain HEENT: head is normocephalic, atraumatic.  Sclera are noninjected.  Pupils are equal  Ears and nose without any masses or lesions.  Mouth is pink and moist Heart: regular, rate, and rhythm.  Normal s1,s2. No obvious murmurs, gallops, or rubs noted.  Palpable radial and pedal pulses bilaterally Lungs: CTAB, no wheezes, rhonchi, or rales noted.  Respiratory effort nonlabored Abd: distended in extreme pain, diffuse abdominal pain, no BS,  MS: all 4 extremities are symmetrical with no cyanosis, clubbing, or edema. Skin: warm and dry with no masses, lesions, or rashes Neuro: Cranial nerves 2-12 grossly intact, sensation is normal throughout Psych: A&Ox3 with an appropriate affect.    Lab Results Last 48 Hours          Results for orders placed or performed during the hospital encounter of 01/13/20 (from the past 48 hour(s))  Comprehensive metabolic panel  Status: Abnormal    Collection Time: 01/13/20 11:31 PM  Result Value Ref Range    Sodium 134 (L) 135 - 145 mmol/L    Potassium 3.7 3.5 - 5.1 mmol/L    Chloride 94 (L) 98 - 111 mmol/L    CO2 25 22 - 32 mmol/L    Glucose, Bld 247 (H) 70 - 99 mg/dL      Comment: Glucose reference range applies only to samples taken after fasting for at least 8 hours.    BUN 25 (H) 8 - 23 mg/dL    Creatinine, Ser 1.04 (H) 0.44 - 1.00 mg/dL    Calcium 9.8 8.9 - 10.3 mg/dL    Total Protein 7.8 6.5 - 8.1 g/dL    Albumin 4.3 3.5 - 5.0 g/dL    AST 24 15 - 41 U/L    ALT 19 0 - 44 U/L    Alkaline Phosphatase 64 38 - 126 U/L    Total Bilirubin 1.4 (H) 0.3 - 1.2 mg/dL    GFR calc non Af Amer 56 (L) >60 mL/min    GFR calc Af Amer >60 >60 mL/min    Anion gap 15 5 - 15      Comment: Performed at [facility], [place] [address], [place], [place] [address]  Lipase, blood     Status: None    Collection Time: 01/13/20 11:31 PM  Result Value Ref Range    Lipase 23 11 - 51 U/L      Comment: Performed at [facility], [place] [address], [place], [place] [address]  CBC with Differential     Status: Abnormal    Collection Time: 01/13/20 11:31 PM  Result Value Ref Range    WBC 24.8 (H) 4.0 - 10.5 K/uL    RBC 4.72 3.87 - 5.11 MIL/uL    Hemoglobin 15.6 (H) 12.0 - 15.0 g/dL    HCT 44.4 36.0 - 46.0 %    MCV 94.1 80.0 - 100.0 fL    MCH 33.1 26.0 - 34.0 pg    MCHC 35.1 30.0 - 36.0 g/dL    RDW 11.9 11.5 - 15.5 %    Platelets 355 150 - 400 K/uL    nRBC 0.0 0.0 - 0.2 %    Neutrophils Relative % 82 %    Neutro Abs 20.1 (H) 1.7 - 7.7 K/uL    Lymphocytes Relative 7 %    Lymphs Abs 1.7 0.7 - 4.0 K/uL    Monocytes Relative 11 %    Monocytes Absolute 2.8 (H) 0.1 - 1.0 K/uL    Eosinophils Relative 0 %    Eosinophils Absolute 0.0 0.0 - 0.5 K/uL     Basophils Relative 0 %    Basophils Absolute 0.1 0.0 - 0.1 K/uL    Immature Granulocytes 0 %    Abs Immature Granulocytes 0.11 (H) 0.00 - 0.07 K/uL      Comment: Performed at [facility], [place] [address] [place], [place] [address]  Lactic acid, plasma     Status: None    Collection Time: 01/14/20 12:57 AM  Result Value Ref Range    Lactic Acid, Venous 1.6 0.5 - 1.9 mmol/L      Comment: Performed at [facility], [place] [address] [place], [place] [address]  SARS Coronavirus 2 by RT PCR (hospital order, performed in [facility] hospital lab) Nasopharyngeal Nasopharyngeal Swab     Status: None    Collection Time: 01/14/20  3:23  AM    Specimen: Nasopharyngeal Swab  Result Value Ref Range    SARS Coronavirus 2 NEGATIVE NEGATIVE      Comment: (NOTE) SARS-CoV-2 target nucleic acids are NOT DETECTED. The SARS-CoV-2 RNA is generally detectable in upper and lower respiratory specimens during the acute phase of infection. The lowest concentration of SARS-CoV-2 viral copies this assay can detect is 250 copies / mL. A negative result does not preclude SARS-CoV-2 infection and should not be used as the sole basis for treatment or other patient management decisions.  A negative result may occur with improper specimen collection / handling, submission of specimen other than nasopharyngeal swab, presence of viral mutation(s) within the areas targeted by this assay, and inadequate number of viral copies (<250 copies / mL). A negative result must be combined with clinical observations, patient history, and epidemiological information. Fact Sheet for Patients:   StrictlyIdeas.no Fact Sheet for Healthcare Providers: BankingDealers.co.za This test is not yet approved or cleared  by the [place] FDA and has been authorized for detection and/or diagnosis of SARS-CoV-2 by FDA under an Emergency Use Authorization (EUA).   This EUA will remain in effect (meaning this test can be used) for the duration of the COVID-19 declaration under Section 564(b)(1) of the Act, 21 U.S.C. section 360bbb-3(b)(1), unless the authorization is terminated or revoked sooner. Performed at [facility], [place] [address], [place], [place] [address]    Urinalysis, Routine w reflex microscopic     Status: Abnormal    Collection Time: 01/14/20  4:27 AM  Result Value Ref Range    Color, Urine YELLOW YELLOW    APPearance CLEAR CLEAR    Specific Gravity, Urine 1.005 1.005 - 1.030    pH 5.0 5.0 - 8.0    Glucose, UA NEGATIVE NEGATIVE mg/dL    Hgb urine dipstick NEGATIVE NEGATIVE    Bilirubin Urine NEGATIVE NEGATIVE    Ketones, ur 5 (A) NEGATIVE mg/dL    Protein, ur 30 (A) NEGATIVE mg/dL    Nitrite NEGATIVE NEGATIVE    Leukocytes,Ua NEGATIVE NEGATIVE    RBC / HPF 0-5 0 - 5 RBC/hpf    WBC, UA 0-5 0 - 5 WBC/hpf    Bacteria, UA NONE SEEN NONE SEEN    Squamous Epithelial / LPF 0-5 0 - 5    Mucus PRESENT        Comment: Performed at [facility], [place] [address], [place], [place] [address]       Imaging Results (Last 48 hours)  CT Abdomen Pelvis W Contrast   Result Date: 01/14/2020 CLINICAL DATA:  Left lower quadrant pain radiating to midline EXAM: CT ABDOMEN AND PELVIS WITH CONTRAST TECHNIQUE: Multidetector CT imaging of the abdomen and pelvis was performed using the standard protocol following bolus administration of intravenous contrast. CONTRAST:  172mL OMNIPAQUE IOHEXOL 300 MG/ML  SOLN COMPARISON:  01/09/2020 FINDINGS: Lower chest: Ground-glass opacity within the lingula could reflect early pneumonia or hypoventilatory change. Hepatobiliary: No focal liver abnormality is seen. No gallstones, gallbladder wall thickening, or biliary dilatation. Pancreas: Unremarkable. No pancreatic ductal dilatation or surrounding inflammatory changes. Spleen: Normal in size without focal abnormality. Adrenals/Urinary  Tract: Adrenal glands are unremarkable. Kidneys are normal, without renal calculi, focal lesion, or hydronephrosis. Bladder is unremarkable. Stomach/Bowel: There is a large dilated loop of bowel within the lower central abdomen, measuring up to 9 cm in diameter. The appearance and location is consistent with sigmoid volvulus. There is no wall edema or pneumatosis. No bowel  obstruction. Vascular/Lymphatic: Aortic atherosclerosis. No enlarged abdominal or pelvic lymph nodes. Reproductive: Status post hysterectomy. No adnexal masses. Other: Trace ascites within the lower pelvis. No free intra-abdominal gas. Musculoskeletal: There are no acute or destructive bony lesions. Reconstructed images demonstrate no additional findings. IMPRESSION: 1. Sigmoid volvulus, with distended section of sigmoid colon measuring up to 9 cm in diameter. No evidence of wall thickening or pneumatosis. 2. Trace free fluid within the pelvis. 3.  Aortic Atherosclerosis (ICD10-I70.0). 4. Lingular ground-glass opacity which could reflect hypoventilatory change or early pneumonia. These results were called by telephone at the time of interpretation on 01/14/2020 at 3:23 am to provider MARGAUX VENTER , who verbally acknowledged these results. Electronically Signed   By: Randa Ngo M.D.   On: 01/14/2020 03:21    DG Chest Port 1 View   Result Date: 01/14/2020 CLINICAL DATA:  Abdominal pain EXAM: PORTABLE CHEST 1 VIEW COMPARISON:  10/05/2013 FINDINGS: Single frontal view of the chest demonstrates an unremarkable cardiac silhouette. The ground-glass density within the lingula seen on corresponding CT is not visible by radiograph. No airspace disease, effusion, or pneumothorax on this exam. No acute bony abnormalities. IMPRESSION: 1. Stable chest, no acute process. Ground-glass density on abdominal CT likely reflects hypoventilatory change. Electronically Signed   By: Randa Ngo M.D.   On: 01/14/2020 03:51           Assessment/Plan Hx  hypertension PAD on Plavix/Pletal - LD yesterday Chronic tobacco use GERD Hepatitis C     Sigmoid Volvulus Hx rectal polyps, rectal prolapse, diverticulitis, hemorrhoids    Plan:  Dr. [name] is going to do an emergent Flexible Sigmoidoscopy to try and decompress her. There is concern she may be perforated. If she is not relieved by Sigmoidoscopy she may need emergent partial colectomy, and Hartman's procedure.      Dr. [name] could not decompress the volvulus fully, repeat CT reviewed by Dr. Johney Maine and Dr. [name], going to the [place] 01/14/2020, 6:44 AM Please see Amion for pager number during day hours 7:00am-4:30pm          Cosigned by: Michael Boston, MD at 01/14/2020  9:24 AM  Revision History                Routing History

## 2020-01-14 NOTE — Op Note (Addendum)
[facility] Patient Name: Margaret Kelly Procedure Date: 01/14/2020 MRN: [id number] Attending MD: Estill Cotta. [name] , MD Date of Birth: 06-09-1951 CSN: [id number] Age: 69 Admit Type: Inpatient Procedure:                Flexible Sigmoidoscopy Indications:              For therapy of volvulus Providers:                Mallie Mussel L. [name], MD, Cleda Daub, RN, William Dalton, Technician Referring MD:             [facility] ED Provider Medicines:                Monitored Anesthesia Care Complications:            No immediate complications. Estimated Blood Loss:     Estimated blood loss: none. Procedure:                Pre-Anesthesia Assessment:                           - Prior to the procedure, a History and Physical                            was performed, and patient medications and                            allergies were reviewed. The patient's tolerance of                            previous anesthesia was also reviewed. The risks                            and benefits of the procedure and the sedation                            options and risks were discussed with the patient.                            All questions were answered, and informed consent                            was obtained. Prior Anticoagulants: The patient has                            taken Plavix (clopidogrel), last dose was 1 day                            prior to procedure. ASA Grade Assessment: IV - A                            patient with severe systemic disease that is a  constant threat to life. After reviewing the risks                            and benefits, the patient was deemed in                            satisfactory condition to undergo the procedure.                           After obtaining informed consent, the scope was                            passed under direct vision. The CF-[id number] ([id number])                Olympus colonoscope was introduced through the anus                            and advanced to the the descending colon. After                            obtaining informed consent, the scope was passed                            under direct vision. The flexible sigmoidoscopy was                            performed with difficulty due to poor bowel prep                            and a redundant colon. The patient tolerated the                            procedure well. Scope In: 8:25:23 AM Scope Out: 8:37:26 AM Total Procedure Duration: 0 hours 12 minutes 3 seconds  Findings:      Hemorrhoids were found on perianal exam.      The digital rectal exam findings include decreased sphincter tone.      Normal mucosa was found in the rectum, in the sigmoid colon and in the       distal descending colon.      A probable volvulus with viable appearing mucosa was found in the distal       sigmoid colon. Unprepped - poor visualization despite lavage.       Decompression of the volvulus was attempted, and partial decompression       was achieved. The scope was navigated proximal to the volvulus, and       there was no dilatation. Suction performed. Torque applied to scope to       reduce volvulus, but little effect. The typical sudden catharsis of air       and stool after complete reduction of volvulus was not achieved. Impression:               - Hemorrhoids found on perianal exam.                           - Decreased  sphincter tone found on digital rectal                            exam.                           - Normal mucosa in the rectum, in the sigmoid colon                            and in the distal descending colon.                           - Probable volvulus. Partial decompression achieved.                           - No specimens collected. Moderate Sedation:      MAC sedation used Recommendation:           - STAT CBC, CMP, INR                           STAT  Non-contrast CTAP                           Discussed with surgical service - will need                            operative management. Procedure Code(s):        --- Professional ---                           [phone], Sigmoidoscopy, flexible; with decompression                            (for pathologic distention) (eg, volvulus,                            megacolon), including placement of decompression                            tube, when performed Diagnosis Code(s):        --- Professional ---                           K62.89, Other specified diseases of anus and rectum                           K64.9, Unspecified hemorrhoids                           K56.2, Volvulus CPT copyright 2019 [employer] Association. All rights reserved. The codes documented in this report are preliminary and upon coder review may  be revised to meet current compliance requirements. Margaret Kelly L. [name], MD 01/14/2020 8:52:33 AM This report has been signed electronically. Number of Addenda: 0

## 2020-01-14 NOTE — H&P (View-Only) (Signed)
[facility] Consult Note   History [facility] MRN # [place]:[id number]  Date of Admission: 01/13/2020 Date of Consultation: 01/14/2020 Referring physician: Dr. Rayne Du att. providers found Primary Care Provider: Dorena Dew, FNP Primary Gastroenterologist: Dr. Lucio Edward   Reason for Consultation/Chief Complaint: Sigmoid volvulus  Subjective  HPI:  This is a 69 year old woman, patient of Dr. Lucio Edward with [facility], who presented to the ED overnight with severe generalized abdominal pain and inability to move her bowels or expel gas for the last few days.  She had been suffering from constipation on a regular basis, had a colonoscopy with Dr. Fuller Plan in March of this year.  Over the last several days she was having increasing abdominal pain constipation and bloating with several calls to the office.  Recommendations were given, including advice to come to the emergency department on May 10 when the patient reported being in agony.  She then delayed coming to the ED until last night.  ED provider notes indicate the patient was in severe pain, lab review notes white count of 25,000.  CT scan shows sigmoid volvulus at about 3:00 this morning.  A call was placed to the [facility] group, Dr. Alexis Frock, as they were the group on for the [facility] long ED.  Dr. Therisa Doyne apparently spoke to the endoscopy team and put the patient on for 7:30 AM sigmoidoscopy with one of her partners.  I received a message from Dr. Therisa Doyne about an hour ago stating that once they determined this was a patient of [facility], the case would then be given to me.  I immediately spoke to our endoscopy team, asked him to bring the patient over from the ED, and I came immediately to [facility] long hospital.  This patient is in agony right now and cannot give much else in the way of history or review of systems.  She says the pain has been steadily escalating. At the time my evaluation, the surgical PA is also  evaluating her.  ROS:  Patient is in extremis and unable to provide any additional review of systems.  Past Medical History Past Medical History:  Diagnosis Date  . Anxiety   . Blood transfusion without reported diagnosis 1982  . GERD (gastroesophageal reflux disease)   . History of hepatitis C 10/13/2015  . Hyperlipidemia LDL goal <100 10/13/2015  . Hypertension   . Muscle spasm of left lower extremity 10/13/2015  . Nerve damage 2001   left side as result of fall  . PAD (peripheral artery disease) ([place])   . Pain of left hip joint 02/21/2018  . Plantar wart     Past Surgical History Past Surgical History:  Procedure Laterality Date  . ABDOMINAL AORTOGRAM W/LOWER EXTREMITY Bilateral 03/26/2019   Procedure: ABDOMINAL AORTOGRAM W/LOWER EXTREMITY;  Surgeon: Lorretta Harp, MD;  Location: [place] [facility];  Service: Cardiovascular;  Laterality: Bilateral;  . ABDOMINAL AORTOGRAM W/LOWER EXTREMITY Bilateral 08/03/2019   Procedure: ABDOMINAL AORTOGRAM W/LOWER EXTREMITY;  Surgeon: Lorretta Harp, MD;  Location: [place] [facility];  Service: Cardiovascular;  Laterality: Bilateral;  . ABDOMINAL HYSTERECTOMY  1982  . CARPAL TUNNEL RELEASE Left 2005  . foot spur Left 2007   foot  . PERIPHERAL VASCULAR BALLOON ANGIOPLASTY Left 08/03/2019   Procedure: PERIPHERAL VASCULAR BALLOON ANGIOPLASTY;  Surgeon: Lorretta Harp, MD;  Location: [place] [facility];  Service: Cardiovascular;  Laterality: Left;  SFA/ popliteal    Family History Family History  Problem Relation  Age of Onset  . Colon cancer Neg Hx   . Esophageal cancer Neg Hx   . Rectal cancer Neg Hx   . Stomach cancer Neg Hx     Social History Social History   Socioeconomic History  . Marital status: Divorced    Spouse name: Not on file  . Number of children: Not on file  . Years of education: Not on file  . Highest education level: Not on file  Occupational History  . Not on file  Tobacco Use  . Smoking status: Current  Every Day Smoker    Packs/day: 0.50    Types: Cigarettes  . Smokeless tobacco: Former Systems developer  . Tobacco comment: about half a pack per day  Substance and Sexual Activity  . Alcohol use: Yes    Comment: rare  . Drug use: No  . Sexual activity: Not on file  Other Topics Concern  . Not on file  Social History Narrative  . Not on file   Social Determinants of Health   Financial Resource Strain:   . Difficulty of Paying Living Expenses:   Food Insecurity:   . Worried About Charity fundraiser in the Last Year:   . Arboriculturist in the Last Year:   Transportation Needs:   . Film/video editor (Medical):   Marland Kitchen Lack of Transportation (Non-Medical):   Physical Activity:   . Days of Exercise per Week:   . Minutes of Exercise per Session:   Stress:   . Feeling of Stress :   Social Connections:   . Frequency of Communication with Friends and Family:   . Frequency of Social Gatherings with Friends and Family:   . Attends Religious Services:   . Active Member of Clubs or Organizations:   . Attends Archivist Meetings:   Marland Kitchen Marital Status:     Allergies Allergies  Allergen Reactions  . Amlodipine Other (See Comments)    Ringing in ears    Outpatient Meds Home medications from the H+P and/or nursing med reconciliation reviewed.  Inpatient med list reviewed  _____________________________________________________________________ Objective   Exam:  Current vital signs  Patient Vitals for the past 8 hrs:  BP Temp Temp src Pulse Resp SpO2 Height Weight  01/14/20 0736 (!) 158/79 97.9 F (36.6 C) Oral 97 18 98 % 5\' 6"  (1.676 m) 70.8 kg  01/14/20 0700 135/63 -- -- 91 -- 97 % -- --  01/14/20 0600 (!) 141/68 -- -- 87 19 97 % -- --  01/14/20 0400 133/62 -- -- 92 20 93 % -- --  01/14/20 0250 (!) 144/79 -- -- 84 19 95 % -- --  01/14/20 0000 (!) 161/76 97.9 F (36.6 C) -- 73 19 96 % -- --   No intake or output data in the 24 hours ending 01/14/20 0758  Physical  Exam:    General: this is an acutely ill-appearing female patient in distress  Eyes: sclera anicteric, no redness  ENT: oral mucosa moist without lesions  CV: Tachycardic to 110, , no JVD,, no peripheral edema.  Extremities warm well perfused   resp: clear to auscultation bilaterally, tachypneic, low respiratory volumes  GI: soft, generalized but more so left-sided tenderness, with no audible bowel sounds, but difficult to discern over the patient's crying.  Patient says she is in so much pain she is unable to turn from her right side.  She is not visibly distended and not tympanic  Skin; warm and dry, no rash  or jaundice noted  Neuro: Awake, visibly in distress, clutching her abdomen, unable to participate in neuro exam.  Labs:  CBC Latest Ref Rng & Units 01/13/2020 01/09/2020 01/05/2020  WBC 4.0 - 10.5 K/uL 24.8(H) 10.8(H) 8.5  Hemoglobin 12.0 - 15.0 g/dL 15.6(H) 16.1(H) 14.1  Hematocrit 36.0 - 46.0 % 44.4 47.3(H) 41.7  Platelets 150 - 400 K/uL 355 346 336    CMP Latest Ref Rng & Units 01/13/2020 01/09/2020 01/05/2020  Glucose 70 - 99 mg/dL 247(H) 156(H) 132(H)  BUN 8 - 23 mg/dL 25(H) 27(H) 14  Creatinine 0.44 - 1.00 mg/dL 1.04(H) 0.81 0.74  Sodium 135 - 145 mmol/L 134(L) 135 141  Potassium 3.5 - 5.1 mmol/L 3.7 4.3 4.6  Chloride 98 - 111 mmol/L 94(L) 98 106  CO2 22 - 32 mmol/L 25 28 21   Calcium 8.9 - 10.3 mg/dL 9.8 9.5 9.7  Total Protein 6.5 - 8.1 g/dL 7.8 7.9 7.2  Total Bilirubin 0.3 - 1.2 mg/dL 1.4(H) 0.9 0.4  Alkaline Phos 38 - 126 U/L 64 67 85  AST 15 - 41 U/L 24 21 22   ALT 0 - 44 U/L 19 19 19     No results for input(s): INR in the last 168 hours. _________________________________________________________ Radiologic studies:  CLINICAL DATA:  Left lower quadrant pain radiating to midline   EXAM: CT ABDOMEN AND PELVIS WITH CONTRAST   TECHNIQUE: Multidetector CT imaging of the abdomen and pelvis was performed using the standard protocol following bolus administration  of intravenous contrast.   CONTRAST:  139mL OMNIPAQUE IOHEXOL 300 MG/ML  SOLN   COMPARISON:  01/09/2020   FINDINGS: Lower chest: Ground-glass opacity within the lingula could reflect early pneumonia or hypoventilatory change.   Hepatobiliary: No focal liver abnormality is seen. No gallstones, gallbladder wall thickening, or biliary dilatation.   Pancreas: Unremarkable. No pancreatic ductal dilatation or surrounding inflammatory changes.   Spleen: Normal in size without focal abnormality.   Adrenals/Urinary Tract: Adrenal glands are unremarkable. Kidneys are normal, without renal calculi, focal lesion, or hydronephrosis. Bladder is unremarkable.   Stomach/Bowel: There is a large dilated loop of bowel within the lower central abdomen, measuring up to 9 cm in diameter. The appearance and location is consistent with sigmoid volvulus. There is no wall edema or pneumatosis. No bowel obstruction.   Vascular/Lymphatic: Aortic atherosclerosis. No enlarged abdominal or pelvic lymph nodes.   Reproductive: Status post hysterectomy. No adnexal masses.   Other: Trace ascites within the lower pelvis. No free intra-abdominal gas.   Musculoskeletal: There are no acute or destructive bony lesions. Reconstructed images demonstrate no additional findings.   IMPRESSION: 1. Sigmoid volvulus, with distended section of sigmoid colon measuring up to 9 cm in diameter. No evidence of wall thickening or pneumatosis. 2. Trace free fluid within the pelvis. 3.  Aortic Atherosclerosis (ICD10-I70.0). 4. Lingular ground-glass opacity which could reflect hypoventilatory change or early pneumonia.   These results were called by telephone at the time of interpretation on 01/14/2020 at 3:23 am to provider MARGAUX VENTER , who verbally acknowledged these results.     Electronically Signed   By: Randa Ngo M.D.   On: 01/14/2020 03:21  ______________________________________________________ Other  studies:   _______________________________________________________ Assessment & Plan  Impression:  Acute sigmoid volvulus Generalized abdominal pain  This patient is in great danger and needs emergent attempt at sigmoidoscopic decompression. She had marked leukocytosis on labs at 11:30 PM, fortunately was not acidemic at that point.  No repeat labs at this point.  Lactic acid was  1.6 at midnight.  I think there is a high probability she has bowel that is it least ischemic by now.  She has probably not perforated based on exam, but that is likely to occur if we do not attend to this immediately.   I considered a repeat stat CT scan to assess for perforation, but we have endoscopy room available right now and I think the balance of risk/benefit favors receding with an attempt at decompression now. Surgical consultative service has evaluated the patient, Dr. Richardson Landry gross has been made aware the patient's condition.  I discussed sigmoidoscopy with attempted decompression with Ms. Gent including review of the usual risks and benefits.  He understands the increased risk of perforation in this case and wishes to proceed.  The benefits and risks of the planned procedure were described in detail with the patient or (when appropriate) their health care proxy.  Risks were outlined as including, but not limited to, bleeding, infection, perforation, adverse medication reaction leading to cardiac or pulmonary decompensation, pancreatitis (if ERCP).  The limitation of incomplete mucosal visualization was also discussed.  No guarantees or warranties were given.  Patient at increased risk for cardiopulmonary complications of procedure due to medical comorbidities.   Plan:  Sigmoidoscopy now for attempted decompression Further plans to follow depending on those findings.  She is certainly being admitted to the hospital, will need repeat labs and close monitoring by medical, GI and surgical services.  If  there is continued clinical concern of infarcted bowel or perforation, further imaging will be warranted.  If perforation does not occur and the bowel remains viable, she will need elective surgical resection of the sigmoid colon.   Thank you for the courtesy of this consult.  Please contact me with any questions or concerns.  Nelida Meuse III Office: [phone]

## 2020-01-14 NOTE — Progress Notes (Signed)
Pt transported to CT ?

## 2020-01-14 NOTE — Transfer of Care (Signed)
Immediate Anesthesia Transfer of Care Note  Patient: Margaret [facility]  Procedure(s) Performed: FLEXIBLE SIGMOIDOSCOPY (N/A )  Patient Location: Endoscopy Unit  Anesthesia Type:MAC  Level of Consciousness: drowsy and patient cooperative  Airway & Oxygen Therapy: Patient Spontanous Breathing and Patient connected to face mask oxygen  Post-op Assessment: Report given to RN and Post -op Vital signs reviewed and stable  Post vital signs: Reviewed and stable  Last Vitals:  Vitals Value Taken Time  BP 120/58 01/14/20 0850  Temp    Pulse 95 01/14/20 0852  Resp 28 01/14/20 0852  SpO2 95 % 01/14/20 0852  Vitals shown include unvalidated device data.  Last Pain:  Vitals:   01/14/20 0736  TempSrc: Oral  PainSc: 8          Complications: No apparent anesthesia complications

## 2020-01-14 NOTE — Anesthesia Preprocedure Evaluation (Signed)
Anesthesia Evaluation  Patient identified by MRN, date of birth, ID band Patient awake    Reviewed: Allergy & Precautions, NPO status , Patient's Chart, lab work & pertinent test results  Airway Mallampati: I  TM Distance: >3 FB Neck ROM: Full    Dental   Pulmonary Current Smoker,    Pulmonary exam normal        Cardiovascular hypertension, Pt. on medications Normal cardiovascular exam     Neuro/Psych Anxiety    GI/Hepatic GERD  Medicated and Controlled,(+) Hepatitis -, C  Endo/Other    Renal/GU      Musculoskeletal   Abdominal   Peds  Hematology   Anesthesia Other Findings   Reproductive/Obstetrics                             Anesthesia Physical Anesthesia Plan  ASA: III  Anesthesia Plan: MAC   Post-op Pain Management:    Induction: Intravenous  PONV Risk Score and Plan: 1 and Ondansetron  Airway Management Planned: Nasal Cannula  Additional Equipment:   Intra-op Plan:   Post-operative Plan:   Informed Consent: I have reviewed the patients History and Physical, chart, labs and discussed the procedure including the risks, benefits and alternatives for the proposed anesthesia with the patient or authorized representative who has indicated his/her understanding and acceptance.       Plan Discussed with: CRNA and Surgeon  Anesthesia Plan Comments:         Anesthesia Quick Evaluation

## 2020-01-14 NOTE — Consult Note (Signed)
Medical Consultation   Margaret Kelly  [id number]  DOB: 05-Oct-1950  DOA: 01/13/2020  PCP: Dorena Dew, FNP    Requesting physician: Dr. Michael Boston ([facility])  Reason for consultation: Multiple medical problems   History of Present Illness:  69 year old WF PMHx anxiety, HTN, PAD, HLD, nerve damage left side secondary to fall, LEFT hip pain, Hx hepatitis C. tobacco abuse   Woman, patient of Dr. Lucio Edward with [facility], who presented to the ED overnight with severe generalized abdominal pain and inability to move her bowels or expel gas for the last few days.  She had been suffering from constipation on a regular basis, had a colonoscopy with Dr. Fuller Plan in March of this year.  Over the last several days she was having increasing abdominal pain constipation and bloating with several calls to the office.  Recommendations were given, including advice to come to the emergency department on May 10 when the patient reported being in agony.  She then delayed coming to the ED until last night.  ED provider notes indicate the patient was in severe pain, lab review notes white count of 25,000.  CT scan shows sigmoid volvulus at about 3:00 this morning.  A call was placed to the [facility], Dr. Alexis Frock, as they were the group on for the [facility] long ED.  Dr. Therisa Doyne apparently spoke to the endoscopy team and put the patient on for 7:30 AM sigmoidoscopy with one of her partners.  I received a message from Dr. Therisa Doyne about an hour ago stating that once they determined this was a patient of [facility] practice, the case would then be given to me.  I immediately spoke to our endoscopy team, asked him to bring the patient over from the ED, and I came immediately to [facility] hospital.  This patient is in agony right now and cannot give much else in the way of history or review of systems.  She says the pain has been steadily escalating. At the time my evaluation, the surgical PA is  also evaluating her.      Review of Systems:  Review of Systems  Constitutional: Negative.   HENT: Negative.   Eyes: Negative.   Respiratory: Negative.   Cardiovascular: Negative.   Gastrointestinal: Positive for abdominal pain, nausea and vomiting. Negative for diarrhea.  Genitourinary: Negative.   Musculoskeletal: Negative.   Skin: Negative.   Endo/Heme/Allergies: Negative.   Psychiatric/Behavioral: Negative for depression, hallucinations, memory loss, substance abuse and suicidal ideas. The patient is nervous/anxious. The patient does not have insomnia.      Past Medical History: Past Medical History:  Diagnosis Date  . Anxiety   . Blood transfusion without reported diagnosis 1982  . GERD (gastroesophageal reflux disease)   . History of hepatitis C 10/13/2015  . Hyperlipidemia LDL goal <100 10/13/2015  . Hypertension   . Muscle spasm of left lower extremity 10/13/2015  . Nerve damage 2001   left side as result of fall  . PAD (peripheral artery disease) ([place])   . Pain of left hip joint 02/21/2018  . Plantar wart     Past Surgical History: Past Surgical History:  Procedure Laterality Date  . ABDOMINAL AORTOGRAM W/LOWER EXTREMITY Bilateral 03/26/2019   Procedure: ABDOMINAL AORTOGRAM W/LOWER EXTREMITY;  Surgeon: Lorretta Harp, MD;  Location: [place];  Service: Cardiovascular;  Laterality: Bilateral;  . ABDOMINAL AORTOGRAM W/LOWER EXTREMITY  Bilateral 08/03/2019   Procedure: ABDOMINAL AORTOGRAM W/LOWER EXTREMITY;  Surgeon: Lorretta Harp, MD;  Location: [place] [facility];  Service: Cardiovascular;  Laterality: Bilateral;  . ABDOMINAL HYSTERECTOMY  1982  . CARPAL TUNNEL RELEASE Left 2005  . foot spur Left 2007   foot  . PERIPHERAL VASCULAR BALLOON ANGIOPLASTY Left 08/03/2019   Procedure: PERIPHERAL VASCULAR BALLOON ANGIOPLASTY;  Surgeon: Lorretta Harp, MD;  Location: [place] [facility];  Service: Cardiovascular;  Laterality: Left;  SFA/ popliteal      Allergies:   Allergies  Allergen Reactions  . Amlodipine Other (See Comments)    Ringing in ears     Social History:  reports that she has been smoking cigarettes. She has been smoking about 0.50 packs per day. She has quit using smokeless tobacco. She reports current alcohol use. She reports that she does not use drugs.   Family History: Family History  Problem Relation Age of Onset  . Colon cancer Neg Hx   . Esophageal cancer Neg Hx   . Rectal cancer Neg Hx   . Stomach cancer Neg Hx      Procedures/Significant Events:  5/13;EXPLORATORY LAPAROTOMY, ILEOCOLECTOMY, TAP BLOCK - BILATERAL    I have personally reviewed and interpreted all radiology studies and my findings are as above.  VENTILATOR SETTINGS: Nasal cannula 5/13 Flow 2 L/min SPO2 97%    Cultures   Antimicrobials: Anti-infectives (From admission, onward)   Start     Dose/Rate Stop   01/14/20 1730  piperacillin-tazobactam (ZOSYN) IVPB 3.375 g     3.375 g 12.5 mL/hr over 240 Minutes 01/19/20 1359   01/14/20 1001  sodium chloride 0.9 % with cefoTEtan (CEFOTAN) ADS Med    Note to Pharmacy: Jefm Miles   : cabinet override    01/14/20 1059   01/14/20 0945  cefoTEtan (CEFOTAN) 2 g in sodium chloride 0.9 % 100 mL IVPB     2 g 200 mL/hr over 30 Minutes 01/14/20 1040   01/14/20 0015  cefTRIAXone (ROCEPHIN) 2 g in sodium chloride 0.9 % 100 mL IVPB     2 g 200 mL/hr over 30 Minutes 01/14/20 0158   01/14/20 0015  metroNIDAZOLE (FLAGYL) IVPB 500 mg     500 mg 100 mL/hr over 60 Minutes 01/14/20 0243       Devices    LINES / TUBES:      Continuous Infusions: . acetaminophen    . lactated ringers    . lactated ringers 125 mL/hr at 01/14/20 1729  . methocarbamol (ROBAXIN) IV    . piperacillin-tazobactam (ZOSYN)  IV       Physical Exam: Vitals:   01/14/20 1400 01/14/20 1415 01/14/20 1515 01/14/20 1615  BP: 137/70 105/89 135/73 122/66  Pulse: 90 92 95 91  Resp: 18 16 (!) 24 17   Temp:   99 F (37.2 C) 99 F (37.2 C)  TempSrc:      SpO2: 99% 99% 99% 99%  Weight:      Height:        General: A/O x4, positive acute respiratory distress Eyes: negative scleral hemorrhage, negative anisocoria, negative icterus ENT: Negative Runny nose, negative gingival bleeding, NG tube right nare draining dark bilious fluid Neck:  Negative scars, masses, torticollis, lymphadenopathy, JVD, RIGHT IJ central line in place covered and clean Lungs: Clear to auscultation bilaterally without wheezes or crackles Cardiovascular: Regular rate and rhythm without murmur gallop or rub normal S1 and S2 Abdomen: Positive abdominal pain, positive distended with wound  VAC in place draining sanguinous fluid, negative soft, bowel sounds, no rebound, no ascites, no appreciable mass Extremities: No significant cyanosis, clubbing, or edema bilateral lower extremities Skin: Negative rashes, lesions, ulcers Psychiatric:  Negative depression, negative anxiety, negative fatigue, negative mania  Central nervous system:  Cranial nerves II through XII intact, tongue/uvula midline, all extremities muscle strength 5/5, sensation intact throughout,  negative dysarthria, negative expressive aphasia, negative receptive aphasia.  Data reviewed:  I have personally reviewed following labs and imaging studies Labs:  CBC: Recent Labs  Lab 01/09/20 1043 01/13/20 2331 01/14/20 0950  WBC 10.8* 24.8* 26.8*  NEUTROABS  --  20.1*  --   HGB 16.1* 15.6* 14.4  HCT 47.3* 44.4 41.3  MCV 95.4 94.1 95.2  PLT 346 355 [id number]    Basic Metabolic Panel: Recent Labs  Lab 01/09/20 1043 01/09/20 1043 01/13/20 2331 01/14/20 0950  NA 135  --  134* 132*  K 4.3   < > 3.7 3.6  CL 98  --  94* 95*  CO2 28  --  25 24  GLUCOSE 156*  --  247* 233*  BUN 27*  --  25* 21  CREATININE 0.81  --  1.04* 0.83  CALCIUM 9.5  --  9.8 8.7*   < > = values in this interval not displayed.   GFR Estimated Creatinine Clearance: 62.4 mL/min (by  C-G formula based on SCr of 0.83 mg/dL). Liver Function Tests: Recent Labs  Lab 01/09/20 1043 01/13/20 2331 01/14/20 0950  AST 21 24 19   ALT 19 19 17   ALKPHOS 67 64 54  BILITOT 0.9 1.4* 1.0  PROT 7.9 7.8 6.6  ALBUMIN 4.4 4.3 3.2*   Recent Labs  Lab 01/09/20 1043 01/13/20 2331  LIPASE 23 23   No results for input(s): AMMONIA in the last 168 hours. Coagulation profile Recent Labs  Lab 01/14/20 0950  INR 1.1    Cardiac Enzymes: No results for input(s): CKTOTAL, CKMB, CKMBINDEX, TROPONINI in the last 168 hours. BNP: Invalid input(s): POCBNP CBG: Recent Labs  Lab 01/14/20 1747  GLUCAP 189*   D-Dimer No results for input(s): DDIMER in the last 72 hours. Hgb A1c Recent Labs    01/14/20 0952  HGBA1C 6.8*   Lipid Profile No results for input(s): CHOL, HDL, LDLCALC, TRIG, CHOLHDL, LDLDIRECT in the last 72 hours. Thyroid function studies No results for input(s): TSH, T4TOTAL, T3FREE, THYROIDAB in the last 72 hours.  Invalid input(s): FREET3 Anemia work up No results for input(s): VITAMINB12, FOLATE, FERRITIN, TIBC, IRON, RETICCTPCT in the last 72 hours. Urinalysis    Component Value Date/Time   COLORURINE YELLOW 01/14/2020 0427   APPEARANCEUR CLEAR 01/14/2020 0427   LABSPEC 1.005 01/14/2020 0427   PHURINE 5.0 01/14/2020 0427   GLUCOSEU NEGATIVE 01/14/2020 0427   HGBUR NEGATIVE 01/14/2020 0427   BILIRUBINUR NEGATIVE 01/14/2020 0427   BILIRUBINUR neg 01/05/2020 1006   KETONESUR 5 (A) 01/14/2020 0427   PROTEINUR 30 (A) 01/14/2020 0427   UROBILINOGEN 0.2 01/05/2020 1006   UROBILINOGEN 0.2 11/20/2017 1049   NITRITE NEGATIVE 01/14/2020 0427   LEUKOCYTESUR NEGATIVE 01/14/2020 0427     Microbiology Recent Results (from the past 240 hour(s))  SARS Coronavirus 2 by RT PCR (hospital order, performed in [facility] hospital lab) Nasopharyngeal Nasopharyngeal Swab     Status: None   Collection Time: 01/14/20  3:23 AM   Specimen: Nasopharyngeal Swab  Result Value  Ref Range Status   SARS Coronavirus 2 NEGATIVE NEGATIVE Final    Comment: (NOTE)  SARS-CoV-2 target nucleic acids are NOT DETECTED. The SARS-CoV-2 RNA is generally detectable in upper and lower respiratory specimens during the acute phase of infection. The lowest concentration of SARS-CoV-2 viral copies this assay can detect is 250 copies / mL. A negative result does not preclude SARS-CoV-2 infection and should not be used as the sole basis for treatment or other patient management decisions.  A negative result may occur with improper specimen collection / handling, submission of specimen other than nasopharyngeal swab, presence of viral mutation(s) within the areas targeted by this assay, and inadequate number of viral copies (<250 copies / mL). A negative result must be combined with clinical observations, patient history, and epidemiological information. Fact Sheet for Patients:   StrictlyIdeas.no Fact Sheet for Healthcare Providers: BankingDealers.co.za This test is not yet approved or cleared  by the [place] FDA and has been authorized for detection and/or diagnosis of SARS-CoV-2 by FDA under an Emergency Use Authorization (EUA).  This EUA will remain in effect (meaning this test can be used) for the duration of the COVID-19 declaration under Section 564(b)(1) of the Act, 21 U.S.C. section 360bbb-3(b)(1), unless the authorization is terminated or revoked sooner. Performed at [facility], [place] [address], [place], [place] [address]        Inpatient Medications:   Scheduled Meds: . Chlorhexidine Gluconate Cloth  6 each Topical Daily  . [START ON 01/15/2020] enoxaparin (LOVENOX) injection  40 mg Subcutaneous Q24H  . fentaNYL      . fentaNYL      . HYDROmorphone      . insulin aspart  0-15 Units Subcutaneous Q4H  . mouth rinse  15 mL Mouth Rinse BID  . pantoprazole (PROTONIX) IV  40 mg Intravenous QHS    Continuous Infusions: . acetaminophen    . lactated ringers    . lactated ringers 125 mL/hr at 01/14/20 1729  . methocarbamol (ROBAXIN) IV    . piperacillin-tazobactam (ZOSYN)  IV       Radiological Exams on Admission: CT ABDOMEN PELVIS WO CONTRAST  Result Date: 01/14/2020 CLINICAL DATA:  Sigmoid volvulus, ongoing pain after sigmoidoscopy and attempted decompression. EXAM: CT ABDOMEN AND PELVIS WITHOUT CONTRAST TECHNIQUE: Multidetector CT imaging of the abdomen and pelvis was performed following the standard protocol without IV contrast. COMPARISON:  01/14/2020 at 3:02 a.m. FINDINGS: Lower chest: Increased volume loss and ground-glass opacity anteriorly in the left lower lobe on image 1/6. Subsegmental atelectasis in the right middle lobe and right lower lobe. Descending thoracic aortic atherosclerosis. Coronary atherosclerosis. Hepatobiliary: Mildly prominent gallbladder diameter, otherwise unremarkable. Pancreas: Unremarkable Spleen: Unremarkable Adrenals/Urinary Tract: Low-density fullness of the left adrenal gland. Excreted contrast in the collecting systems, ureters, and urinary bladder. There is some faint residual stranding in the kidneys, correlate with creatinine in assessing for any low-grade renal dysfunction. Stomach/Bowel: Similar appearance of sigmoid volvulus. Adjacent mild mesenteric/omental stranding. The volvulus is not reduced. Colon diameter in the affected segment 9 0.5 cm. No pneumatosis or extraluminal gas. Vascular/Lymphatic: Aortoiliac atherosclerotic vascular disease. Reproductive: Uterus absent. Adnexa unremarkable. Other: Mild pelvic ascites, similar to prior. The trace ascites along the right paracolic gutter is mildly increased. Musculoskeletal: Degenerative hip arthropathy bilaterally. Multilevel lumbar degenerative facet arthropathy. IMPRESSION: 1. Similar appearance of sigmoid volvulus. No pneumatosis or extraluminal gas. 2. Mild pelvic ascites, similar to prior. 3.  Increased volume loss and ground-glass opacity anteriorly in the left lower lobe, potentially from atelectasis or less likely early pneumonia. 4. Faint residual stranding in the kidneys, correlate with  creatinine in assessing for any low-grade renal dysfunction. 5. Aortic atherosclerosis. Coronary atherosclerosis. Aortic Atherosclerosis (ICD10-I70.0). Electronically Signed   By: Van Clines M.D.   On: 01/14/2020 10:04   CT Abdomen Pelvis W Contrast  Result Date: 01/14/2020 CLINICAL DATA:  Left lower quadrant pain radiating to midline EXAM: CT ABDOMEN AND PELVIS WITH CONTRAST TECHNIQUE: Multidetector CT imaging of the abdomen and pelvis was performed using the standard protocol following bolus administration of intravenous contrast. CONTRAST:  167mL OMNIPAQUE IOHEXOL 300 MG/ML  SOLN COMPARISON:  01/09/2020 FINDINGS: Lower chest: Ground-glass opacity within the lingula could reflect early pneumonia or hypoventilatory change. Hepatobiliary: No focal liver abnormality is seen. No gallstones, gallbladder wall thickening, or biliary dilatation. Pancreas: Unremarkable. No pancreatic ductal dilatation or surrounding inflammatory changes. Spleen: Normal in size without focal abnormality. Adrenals/Urinary Tract: Adrenal glands are unremarkable. Kidneys are normal, without renal calculi, focal lesion, or hydronephrosis. Bladder is unremarkable. Stomach/Bowel: There is a large dilated loop of bowel within the lower central abdomen, measuring up to 9 cm in diameter. The appearance and location is consistent with sigmoid volvulus. There is no wall edema or pneumatosis. No bowel obstruction. Vascular/Lymphatic: Aortic atherosclerosis. No enlarged abdominal or pelvic lymph nodes. Reproductive: Status post hysterectomy. No adnexal masses. Other: Trace ascites within the lower pelvis. No free intra-abdominal gas. Musculoskeletal: There are no acute or destructive bony lesions. Reconstructed images demonstrate no additional  findings. IMPRESSION: 1. Sigmoid volvulus, with distended section of sigmoid colon measuring up to 9 cm in diameter. No evidence of wall thickening or pneumatosis. 2. Trace free fluid within the pelvis. 3.  Aortic Atherosclerosis (ICD10-I70.0). 4. Lingular ground-glass opacity which could reflect hypoventilatory change or early pneumonia. These results were called by telephone at the time of interpretation on 01/14/2020 at 3:23 am to provider MARGAUX VENTER , who verbally acknowledged these results. Electronically Signed   By: Randa Ngo M.D.   On: 01/14/2020 03:21   DG Chest Port 1 View  Result Date: 01/14/2020 CLINICAL DATA:  Abdominal pain EXAM: PORTABLE CHEST 1 VIEW COMPARISON:  10/05/2013 FINDINGS: Single frontal view of the chest demonstrates an unremarkable cardiac silhouette. The ground-glass density within the lingula seen on corresponding CT is not visible by radiograph. No airspace disease, effusion, or pneumothorax on this exam. No acute bony abnormalities. IMPRESSION: 1. Stable chest, no acute process. Ground-glass density on abdominal CT likely reflects hypoventilatory change. Electronically Signed   By: Randa Ngo M.D.   On: 01/14/2020 03:51    Impression/Recommendations Principal Problem:   Cecal volvulus s/p ileocolectomy 01/14/2020 Active Problems:   Essential hypertension   Long term current use of anticoagulant therapy   Nerve damage   GERD (gastroesophageal reflux disease)   Blood transfusion without reported diagnosis   Anxiety   Bowel obstruction (HCC)   Sigmoid volvulus (HCC)  Essential HTN -Metoprolol IV 5 mg PRN -Hold all other BP medication at this time patient just s/p abdominal surgery   PAD -All home meds on hold secondary to abdominal surgery  HLD -All home meds on hold secondary to abdominal surgery  Anxiety -Ativan 1 to 2 mg PRN   Tobacco abuse -Nicotine patch PRN   Sigmoid volvulus/Bowel obstruction -5/13 s/p ex lap see  above  Refractory nausea -Zofran PRN -Thorazine PRN if Zofran fails to alleviate nausea      Thank you for this consultation.  Our [facility] hospitalist team will follow the patient with you.   Time Spent: 35 minutes  Margaurite Salido, Geraldo Docker M.D. [facility] 01/14/2020,  5:48 PM  [id number]

## 2020-01-14 NOTE — Progress Notes (Addendum)
Margaret Kelly  [birth year] [id number]  Patient Care Team: Dorena Dew, FNP as PCP - General (Family Medicine) [name], MD as PCP - Cardiology (Cardiology) [name], MD as Consulting Physician (Gastroenterology)  Patient completed endoscopy.  Called by Dr. [name].  He was able to get across it but did not feel it detorsed very well nor decompressed well.  Patient still with abdominal pain.  CT follow-up notes that volvulus is only partially reduced and still giant sigmoid colon.  Patient still distended with pain and peritonitis in endoscopy.  Dr [name] & nursing at bedside  Plan emergent operation.  With her massive distention and tachycardia, open approach with Hartman resection and colostomy.  ICU bed postop.  Surgery would like some medical or critical care help in managing this complicated patient.  We will operate on the patient and try to get her through the acute phase.  She has increased risk of bleeding since she is on Plavix.  Last dose a day ago.  Increased cardiopulmonary and hepatic risks with her peripheral disease & cirrhosis.  Check coags.  Type and screen ordered.  Patient is miserable and wishes to proceed with surgery.  D/w Dr Lenis Dickinson  The anatomy & physiology of the digestive tract was discussed.  The pathophysiology of the colon was discussed.  Natural history risks without surgery was discussed.   I feel the risks of no intervention will lead to serious problems that outweigh the operative risks; therefore, I recommended a partial colectomy to remove the pathology.  Minimally invasive (Robotic/Laparoscopic) & open techniques were discussed.   Risks such as bleeding, infection, abscess, leak, reoperation, injury to other organs, need for repair of tissues / organs, possible ostomy, hernia, heart attack, stroke, death, and other risks were discussed.  I noted a good likelihood this will help address the problem.   Goals of post-operative recovery were discussed as  well.   Need for adequate nutrition, daily bowel regimen and healthy physical activity, to optimize recovery was noted as well. We will work to minimize complications.  Educational materials were available as well.  Questions were answered.  The patient expresses understanding & wishes to proceed with surgery.   Patient Active Problem List   Diagnosis Date Noted  . Volvulus of sigmoid colon ([place]) 01/14/2020  . GERD (gastroesophageal reflux disease)   . Anxiety   . Abnormal CT scan 10/09/2019  . Diverticulitis 10/09/2019  . Long term current use of anticoagulant therapy 10/09/2019  . Preop examination 10/09/2019  . Claudication in peripheral vascular disease ([place]) 08/03/2019  . Peripheral arterial disease ([place]) 03/20/2019  . Tobacco abuse counseling 11/12/2018  . Pain of left hip joint 02/21/2018  . Essential hypertension 10/13/2015  . Hyperlipidemia LDL goal <100 10/13/2015  . Muscle spasm of left lower extremity 10/13/2015  . History of hepatitis C 10/13/2015  . Nerve damage 2001  . Blood transfusion without reported diagnosis 1982    Past Medical History:  Diagnosis Date  . Anxiety   . Blood transfusion without reported diagnosis 1982  . GERD (gastroesophageal reflux disease)   . History of hepatitis C 10/13/2015  . Hyperlipidemia LDL goal <100 10/13/2015  . Hypertension   . Muscle spasm of left lower extremity 10/13/2015  . Nerve damage 2001   left side as result of fall  . PAD (peripheral artery disease) ([place])   . Pain of left hip joint 02/21/2018  . Plantar wart     Past Surgical History:  Procedure  Laterality Date  . ABDOMINAL AORTOGRAM W/LOWER EXTREMITY Bilateral 03/26/2019   Procedure: ABDOMINAL AORTOGRAM W/LOWER EXTREMITY;  Surgeon: Lorretta Harp, MD;  Location: [facility];  Service: Cardiovascular;  Laterality: Bilateral;  . ABDOMINAL AORTOGRAM W/LOWER EXTREMITY Bilateral 08/03/2019   Procedure: ABDOMINAL AORTOGRAM W/LOWER EXTREMITY;  Surgeon: Lorretta Harp, MD;  Location: [facility];  Service: Cardiovascular;  Laterality: Bilateral;  . ABDOMINAL HYSTERECTOMY  1982  . CARPAL TUNNEL RELEASE Left 2005  . foot spur Left 2007   foot  . PERIPHERAL VASCULAR BALLOON ANGIOPLASTY Left 08/03/2019   Procedure: PERIPHERAL VASCULAR BALLOON ANGIOPLASTY;  Surgeon: Lorretta Harp, MD;  Location: [facility];  Service: Cardiovascular;  Laterality: Left;  SFA/ popliteal    Social History   Socioeconomic History  . Marital status: Divorced    Spouse name: Not on file  . Number of children: Not on file  . Years of education: Not on file  . Highest education level: Not on file  Occupational History  . Not on file  Tobacco Use  . Smoking status: Current Every Day Smoker    Packs/day: 0.50    Types: Cigarettes  . Smokeless tobacco: Former Systems developer  . Tobacco comment: about half a pack per day  Substance and Sexual Activity  . Alcohol use: Yes    Comment: rare  . Drug use: No  . Sexual activity: Not on file  Other Topics Concern  . Not on file  Social History Narrative  . Not on file   Social Determinants of Health   Financial Resource Strain:   . Difficulty of Paying Living Expenses:   Food Insecurity:   . Worried About Charity fundraiser in the Last Year:   . Arboriculturist in the Last Year:   Transportation Needs:   . Film/video editor (Medical):   Marland Kitchen Lack of Transportation (Non-Medical):   Physical Activity:   . Days of Exercise per Week:   . Minutes of Exercise per Session:   Stress:   . Feeling of Stress :   Social Connections:   . Frequency of Communication with Friends and Family:   . Frequency of Social Gatherings with Friends and Family:   . Attends Religious Services:   . Active Member of Clubs or Organizations:   . Attends Archivist Meetings:   Marland Kitchen Marital Status:   Intimate Partner Violence:   . Fear of Current or Ex-Partner:   . Emotionally Abused:   Marland Kitchen Physically Abused:   .  Sexually Abused:     Family History  Problem Relation Age of Onset  . Colon cancer Neg Hx   . Esophageal cancer Neg Hx   . Rectal cancer Neg Hx   . Stomach cancer Neg Hx     Current Facility-Administered Medications  Medication Dose Route Frequency Provider Last Rate Last Admin  . alvimopan (ENTEREG) capsule 12 mg  12 mg Oral On Call to OR Michael Boston, MD      . bupivacaine liposome (EXPAREL) 1.3 % injection 266 mg  20 mL Infiltration Once Michael Boston, MD      . cefoTEtan (CEFOTAN) 2 g in sodium chloride 0.9 % 100 mL IVPB  2 g Intravenous On Call to OR Michael Boston, MD      . enoxaparin (LOVENOX) injection 40 mg  40 mg Subcutaneous Once Michael Boston, MD      . lactated ringers infusion   Intravenous Continuous Nelida Meuse  III, MD   New Bag at 01/14/20 0805     Allergies  Allergen Reactions  . Amlodipine Other (See Comments)    Ringing in ears    BP (!) 149/79   Pulse (!) 106   Temp 98.8 F (37.1 C) (Axillary)   Resp (!) 36   Ht 5\' 6"  (1.676 m)   Wt 70.8 kg   SpO2 91%   BMI 25.19 kg/m   CT Abdomen Pelvis W Contrast  Result Date: 01/14/2020 CLINICAL DATA:  Left lower quadrant pain radiating to midline EXAM: CT ABDOMEN AND PELVIS WITH CONTRAST TECHNIQUE: Multidetector CT imaging of the abdomen and pelvis was performed using the standard protocol following bolus administration of intravenous contrast. CONTRAST:  172mL OMNIPAQUE IOHEXOL 300 MG/ML  SOLN COMPARISON:  01/09/2020 FINDINGS: Lower chest: Ground-glass opacity within the lingula could reflect early pneumonia or hypoventilatory change. Hepatobiliary: No focal liver abnormality is seen. No gallstones, gallbladder wall thickening, or biliary dilatation. Pancreas: Unremarkable. No pancreatic ductal dilatation or surrounding inflammatory changes. Spleen: Normal in size without focal abnormality. Adrenals/Urinary Tract: Adrenal glands are unremarkable. Kidneys are normal, without renal calculi, focal lesion, or  hydronephrosis. Bladder is unremarkable. Stomach/Bowel: There is a large dilated loop of bowel within the lower central abdomen, measuring up to 9 cm in diameter. The appearance and location is consistent with sigmoid volvulus. There is no wall edema or pneumatosis. No bowel obstruction. Vascular/Lymphatic: Aortic atherosclerosis. No enlarged abdominal or pelvic lymph nodes. Reproductive: Status post hysterectomy. No adnexal masses. Other: Trace ascites within the lower pelvis. No free intra-abdominal gas. Musculoskeletal: There are no acute or destructive bony lesions. Reconstructed images demonstrate no additional findings. IMPRESSION: 1. Sigmoid volvulus, with distended section of sigmoid colon measuring up to 9 cm in diameter. No evidence of wall thickening or pneumatosis. 2. Trace free fluid within the pelvis. 3.  Aortic Atherosclerosis (ICD10-I70.0). 4. Lingular ground-glass opacity which could reflect hypoventilatory change or early pneumonia. These results were called by telephone at the time of interpretation on 01/14/2020 at 3:23 am to provider MARGAUX VENTER , who verbally acknowledged these results. Electronically Signed   By: Randa Ngo M.D.   On: 01/14/2020 03:21   CT ABDOMEN PELVIS W CONTRAST  Result Date: 01/09/2020 CLINICAL DATA:  No bowel movement in 2 days. Abdominal pain. EXAM: CT ABDOMEN AND PELVIS WITH CONTRAST TECHNIQUE: Multidetector CT imaging of the abdomen and pelvis was performed using the standard protocol following bolus administration of intravenous contrast. CONTRAST:  142mL OMNIPAQUE IOHEXOL 300 MG/ML  SOLN COMPARISON:  June 19, 2019 FINDINGS: Lower chest: No acute abnormality. Hepatobiliary: No focal liver abnormality is seen. No gallstones, gallbladder wall thickening, or biliary dilatation. Pancreas: Unremarkable. No pancreatic ductal dilatation or surrounding inflammatory changes. Spleen: Normal in size without focal abnormality. Adrenals/Urinary Tract: Adrenal glands  are unremarkable. Kidneys are normal, without renal calculi, focal lesion, or hydronephrosis. Bladder is unremarkable. Stomach/Bowel: Stomach is within normal limits. No evidence of small bowel wall thickening, distention, or inflammatory changes. Average stool burden throughout the colon which is normal in caliber. Nonspecific thickening of the distal rectum/anus. Vascular/Lymphatic: Aortic atherosclerosis. No enlarged abdominal or pelvic lymph nodes. Reproductive: Status post hysterectomy. No adnexal masses. Other: No abdominal wall hernia or abnormality. No abdominopelvic ascites. Musculoskeletal: Spondylosis of the lumbosacral spine. IMPRESSION: 1. No evidence of acute abnormalities within the solid abdominal organs. 2. Average stool burden throughout the colon. 3. Nonspecific thickening of the distal rectum/anus. This may represent a soft tissue mass or internal hemorrhoids. Please correlate  to physical exam findings. Aortic Atherosclerosis (ICD10-I70.0). Electronically Signed   By: Fidela Salisbury M.D.   On: 01/09/2020 14:07   DG Chest Port 1 View  Result Date: 01/14/2020 CLINICAL DATA:  Abdominal pain EXAM: PORTABLE CHEST 1 VIEW COMPARISON:  10/05/2013 FINDINGS: Single frontal view of the chest demonstrates an unremarkable cardiac silhouette. The ground-glass density within the lingula seen on corresponding CT is not visible by radiograph. No airspace disease, effusion, or pneumothorax on this exam. No acute bony abnormalities. IMPRESSION: 1. Stable chest, no acute process. Ground-glass density on abdominal CT likely reflects hypoventilatory change. Electronically Signed   By: Randa Ngo M.D.   On: 01/14/2020 03:51    Note: This dictation was prepared with Dragon/digital dictation along with [employer]. Any transcriptional errors that result from this process are unintentional.   .[name], M.D., F.A.C.S. Gastrointestinal and Minimally Invasive Surgery [facility] [address] [place], [place] [id number] [phone] Main / Paging  01/14/2020 9:44 AM

## 2020-01-14 NOTE — ED Provider Notes (Addendum)
[facility] DEPT Provider Note   CSN: [id number] Arrival date & time: 01/13/20  2250     History Chief Complaint  Patient presents with  . Abdominal Pain    Margaret Kelly is a 69 y.o. female with PMHx HTN, HLD, GERD, hx of Hep C who presents to the ED today with gradual onset, constant, worsening, diffuse lower abdominal pain x 8 days.  Patient reports she has been taking Tylenol for pain without relief.  States that she has had very small watery stools the past 2 days due to taking Dulcolax however states she still feels constipated and still on severe amount of pain.  Patient states she has felt nauseated but not vomited.  She denies fevers or chills.  Denies urinary symptoms.  Denies blood in stool.   Per chart review patient was seen in the ED on 05/08 with complaint of constipation and cramping of her right lower abdomen.  ED work-up included lab work including CBC, CMP, lipase without any acute findings.  T scan was obtained due to concern for bowel obstruction.  ET showed inflammation of the rectum.  Rectal exam was performed findings of inflamed/swollen tissue circumferentially around the rectum with question of hemorrhoids versus condyloma.  Was treated with belladonna suppositories and plan for close outpatient follow-up with GI.   Telephone encounter on 5/09 with GI with patient reporting that she was unable to pick up her suppositories at the pharmacy.  She was advised take MiraLAX twice daily, Dulcolax suppository once twice daily and Gas-X twice daily.   Telephone visit the next day on 5/0 10 with noted patient audibly in tears and panting.  Unable to get much information out of her on the phone and she was advised to go to the ED due to severity of pain.  Patient waited 2 days and proceeded to come to the ED today.    The history is provided by the patient and medical records.       Past Medical History:  Diagnosis Date  . Anxiety   .  Blood transfusion without reported diagnosis 1982  . GERD (gastroesophageal reflux disease)   . History of hepatitis C 10/13/2015  . Hyperlipidemia LDL goal <100 10/13/2015  . Hypertension   . Muscle spasm of left lower extremity 10/13/2015  . Nerve damage 2001   left side as result of fall  . PAD (peripheral artery disease) ([place])   . Pain of left hip joint 02/21/2018  . Plantar wart     Patient Active Problem List   Diagnosis Date Noted  . Abnormal CT scan 10/09/2019  . Diverticulitis 10/09/2019  . Long term current use of anticoagulant therapy 10/09/2019  . Preop examination 10/09/2019  . Claudication in peripheral vascular disease ([place]) 08/03/2019  . Peripheral arterial disease ([place]) 03/20/2019  . Tobacco abuse counseling 11/12/2018  . Pain of left hip joint 02/21/2018  . Essential hypertension 10/13/2015  . Hyperlipidemia LDL goal <100 10/13/2015  . Muscle spasm of left lower extremity 10/13/2015  . History of hepatitis C 10/13/2015    Past Surgical History:  Procedure Laterality Date  . ABDOMINAL AORTOGRAM W/LOWER EXTREMITY Bilateral 03/26/2019   Procedure: ABDOMINAL AORTOGRAM W/LOWER EXTREMITY;  Surgeon: Lorretta Harp, MD;  Location: [facility];  Service: Cardiovascular;  Laterality: Bilateral;  . ABDOMINAL AORTOGRAM W/LOWER EXTREMITY Bilateral 08/03/2019   Procedure: ABDOMINAL AORTOGRAM W/LOWER EXTREMITY;  Surgeon: Lorretta Harp, MD;  Location: [facility];  Service: Cardiovascular;  Laterality: Bilateral;  . ABDOMINAL HYSTERECTOMY  1982  . CARPAL TUNNEL RELEASE Left 2005  . foot spur Left 2007   foot  . PERIPHERAL VASCULAR BALLOON ANGIOPLASTY Left 08/03/2019   Procedure: PERIPHERAL VASCULAR BALLOON ANGIOPLASTY;  Surgeon: Lorretta Harp, MD;  Location: [facility];  Service: Cardiovascular;  Laterality: Left;  SFA/ popliteal     OB History   No obstetric history on file.     Family History  Problem Relation Age of Onset  . Colon cancer  Neg Hx   . Esophageal cancer Neg Hx   . Rectal cancer Neg Hx   . Stomach cancer Neg Hx     Social History   Tobacco Use  . Smoking status: Current Every Day Smoker    Packs/day: 0.50    Types: Cigarettes  . Smokeless tobacco: Former Systems developer  . Tobacco comment: about half a pack per day  Substance Use Topics  . Alcohol use: Yes    Comment: rare  . Drug use: No    Home Medications Prior to Admission medications   Medication Sig Start Date End Date Taking? Authorizing Provider  acetaminophen (TYLENOL) 500 MG tablet Take 1,000 mg by mouth every 6 (six) hours as needed for mild pain.   Yes [provider]  aspirin EC 81 MG tablet Take 81 mg by mouth daily.   Yes [provider]  carvedilol (COREG) 25 MG tablet TAKE 1 TABLET(25 MG) BY MOUTH TWICE DAILY Patient taking differently: Take 25 mg by mouth in the morning and at bedtime.  09/23/19  Yes Turner, Eber Hong, MD  cilostazol (PLETAL) 50 MG tablet TAKE 1 TABLET(50 MG) BY MOUTH TWICE DAILY Patient taking differently: Take 50 mg by mouth 2 (two) times daily.  10/12/19  Yes Lorretta Harp, MD  clopidogrel (PLAVIX) 75 MG tablet Take 1 tablet (75 mg total) by mouth daily with breakfast. 10/22/19  Yes Bhagat, Bhavinkumar, PA  cyclobenzaprine (FLEXERIL) 10 MG tablet Take 1 tablet (10 mg total) by mouth at bedtime as needed. Patient taking differently: Take 10 mg by mouth at bedtime as needed for muscle spasms.  12/16/19  Yes Dorena Dew, FNP  ezetimibe (ZETIA) 10 MG tablet TAKE 1 TABLET(10 MG) BY MOUTH DAILY Patient taking differently: Take 10 mg by mouth daily.  12/18/19  Yes Weaver, Scott T, PA-C  Fish Oil-Cholecalciferol (FISH OIL + D3) 1200-1000 MG-UNIT CAPS Take 1 capsule by mouth daily.   Yes [provider]  losartan (COZAAR) 100 MG tablet TAKE 1 TABLET BY MOUTH DAILY Patient taking differently: Take 100 mg by mouth daily.  06/08/19  Yes Jegede, Marlena Clipper, MD  MAGNESIUM CITRATE PO Take 480 mg by mouth 2  (two) times a day.   Yes [provider]  Melatonin 12 MG TABS Take 12 mg by mouth at bedtime.   Yes [provider]  Multiple Vitamin (MULTIVITAMIN WITH MINERALS) TABS tablet Take 1 tablet by mouth daily. Women's One-A-Day    Yes [provider]  pantoprazole (PROTONIX) 40 MG tablet Take 1 tablet (40 mg total) by mouth daily. 12/07/19  Yes Dorena Dew, FNP  rosuvastatin (CRESTOR) 10 MG tablet Take 10 mg by mouth at bedtime. 01/05/20  Yes [provider]  acetaminophen-codeine (TYLENOL #3) 300-30 MG tablet Take 1 tablet by mouth every 6 (six) hours as needed for moderate pain. Patient not taking: Reported on 10/06/2019 09/25/19   Dorena Dew, FNP  belladonna-opium (B&O SUPPRETTES) 16.2-30 MG suppository Place  1 suppository rectally every 8 (eight) hours as needed for pain. 01/09/20   Caccavale, Sophia, PA-C  hydrochlorothiazide (HYDRODIURIL) 12.5 MG tablet Take 1 tablet (12.5 mg total) by mouth daily. Patient not taking: Reported on 01/14/2020 01/05/20   Dorena Dew, FNP  hydrocortisone (ANUSOL-HC) 25 MG suppository Place 1 suppository (25 mg total) rectally 2 (two) times daily. 01/09/20   Carmin Muskrat, MD  hyoscyamine (LEVSIN SL) 0.125 MG SL tablet Place 1 tablet (0.125 mg total) under the tongue every 6 (six) hours as needed. Patient not taking: Reported on 01/09/2020 10/09/19   Zehr, Laban Emperor, PA-C  varenicline (CHANTIX CONTINUING MONTH PAK) 1 MG tablet Take 1 tablet (1 mg total) by mouth 2 (two) times daily. Patient not taking: Reported on 01/09/2020 08/24/19   [name], MD  varenicline (CHANTIX STARTING MONTH PAK) 0.5 MG X 11 & 1 MG X 42 tablet Take one 0.5 mg tablet by mouth once daily for 3 days, then increase to one 0.5 mg tablet twice daily for 4 days, then increase to one 1 mg tablet twice daily. Patient not taking: Reported on 01/05/2020 08/24/19   [name], MD    Allergies    Amlodipine  Review of Systems   Review of Systems    Constitutional: Negative for chills and fever.  Gastrointestinal: Positive for abdominal pain, constipation and nausea. Negative for blood in stool and vomiting.  Genitourinary: Negative for difficulty urinating, dysuria, flank pain and frequency.  All other systems reviewed and are negative.   Physical Exam Updated Vital Signs BP 133/62   Pulse 92   Temp 97.9 F (36.6 C)   Resp 20   SpO2 93%   Physical Exam Vitals and nursing note reviewed.  Constitutional:      Appearance: She is not diaphoretic.     Comments: Very uncomfortable appearing female, moaning in bed  HENT:     Head: Normocephalic and atraumatic.  Eyes:     Conjunctiva/sclera: Conjunctivae normal.  Cardiovascular:     Rate and Rhythm: Normal rate and regular rhythm.     Heart sounds: Normal heart sounds.  Pulmonary:     Effort: Pulmonary effort is normal.     Breath sounds: Normal breath sounds. No wheezing, rhonchi or rales.  Abdominal:     General: Bowel sounds are normal.     Palpations: Abdomen is soft.     Tenderness: There is generalized abdominal tenderness. There is guarding (voluntary). There is no right CVA tenderness, left CVA tenderness or rebound.  Genitourinary:    Comments: Female chaperone present for GU exam, Clarice Pole, RN Circumferential inflammed tissue around rectum. Good rectal tone. No obvious fecal impaction.  Musculoskeletal:     Cervical back: Neck supple.  Skin:    General: Skin is warm and dry.  Neurological:     Mental Status: She is alert.     ED Results / Procedures / Treatments   Labs (all labs ordered are listed, but only abnormal results are displayed) Labs Reviewed  COMPREHENSIVE METABOLIC PANEL - Abnormal; Notable for the following components:      Result Value   Sodium 134 (*)    Chloride 94 (*)    Glucose, Bld 247 (*)    BUN 25 (*)    Creatinine, Ser 1.04 (*)    Total Bilirubin 1.4 (*)    GFR calc non Af Amer 56 (*)    All other components within  normal limits  CBC WITH DIFFERENTIAL/PLATELET -  Abnormal; Notable for the following components:   WBC 24.8 (*)    Hemoglobin 15.6 (*)    Neutro Abs 20.1 (*)    Monocytes Absolute 2.8 (*)    Abs Immature Granulocytes 0.11 (*)    All other components within normal limits  URINALYSIS, ROUTINE W REFLEX MICROSCOPIC - Abnormal; Notable for the following components:   Ketones, ur 5 (*)    Protein, ur 30 (*)    All other components within normal limits  SARS CORONAVIRUS 2 BY RT PCR (HOSPITAL ORDER, [facility])  CULTURE, BLOOD (ROUTINE X 2)  CULTURE, BLOOD (ROUTINE X 2)  LIPASE, BLOOD  LACTIC ACID, PLASMA    EKG None  Radiology CT Abdomen Pelvis W Contrast  Result Date: 01/14/2020 CLINICAL DATA:  Left lower quadrant pain radiating to midline EXAM: CT ABDOMEN AND PELVIS WITH CONTRAST TECHNIQUE: Multidetector CT imaging of the abdomen and pelvis was performed using the standard protocol following bolus administration of intravenous contrast. CONTRAST:  131mL OMNIPAQUE IOHEXOL 300 MG/ML  SOLN COMPARISON:  01/09/2020 FINDINGS: Lower chest: Ground-glass opacity within the lingula could reflect early pneumonia or hypoventilatory change. Hepatobiliary: No focal liver abnormality is seen. No gallstones, gallbladder wall thickening, or biliary dilatation. Pancreas: Unremarkable. No pancreatic ductal dilatation or surrounding inflammatory changes. Spleen: Normal in size without focal abnormality. Adrenals/Urinary Tract: Adrenal glands are unremarkable. Kidneys are normal, without renal calculi, focal lesion, or hydronephrosis. Bladder is unremarkable. Stomach/Bowel: There is a large dilated loop of bowel within the lower central abdomen, measuring up to 9 cm in diameter. The appearance and location is consistent with sigmoid volvulus. There is no wall edema or pneumatosis. No bowel obstruction. Vascular/Lymphatic: Aortic atherosclerosis. No enlarged abdominal or pelvic lymph nodes.  Reproductive: Status post hysterectomy. No adnexal masses. Other: Trace ascites within the lower pelvis. No free intra-abdominal gas. Musculoskeletal: There are no acute or destructive bony lesions. Reconstructed images demonstrate no additional findings. IMPRESSION: 1. Sigmoid volvulus, with distended section of sigmoid colon measuring up to 9 cm in diameter. No evidence of wall thickening or pneumatosis. 2. Trace free fluid within the pelvis. 3.  Aortic Atherosclerosis (ICD10-I70.0). 4. Lingular ground-glass opacity which could reflect hypoventilatory change or early pneumonia. These results were called by telephone at the time of interpretation on 01/14/2020 at 3:23 am to provider Emmilynn Marut , who verbally acknowledged these results. Electronically Signed   By: Randa Ngo M.D.   On: 01/14/2020 03:21   DG Chest Port 1 View  Result Date: 01/14/2020 CLINICAL DATA:  Abdominal pain EXAM: PORTABLE CHEST 1 VIEW COMPARISON:  10/05/2013 FINDINGS: Single frontal view of the chest demonstrates an unremarkable cardiac silhouette. The ground-glass density within the lingula seen on corresponding CT is not visible by radiograph. No airspace disease, effusion, or pneumothorax on this exam. No acute bony abnormalities. IMPRESSION: 1. Stable chest, no acute process. Ground-glass density on abdominal CT likely reflects hypoventilatory change. Electronically Signed   By: Randa Ngo M.D.   On: 01/14/2020 03:51    Procedures Procedures (including critical care time)  Medications Ordered in ED Medications  sodium chloride 0.9 % bolus 1,000 mL (0 mLs Intravenous Stopped 01/14/20 0025)  HYDROmorphone (DILAUDID) injection 0.5 mg (0.5 mg Intravenous Given 01/13/20 2346)  ondansetron (ZOFRAN) injection 4 mg (4 mg Intravenous Given 01/13/20 2346)  cefTRIAXone (ROCEPHIN) 2 g in sodium chloride 0.9 % 100 mL IVPB (0 g Intravenous Stopped 01/14/20 0158)  metroNIDAZOLE (FLAGYL) IVPB 500 mg (0 mg Intravenous Stopped 01/14/20  0243)  sodium chloride (PF) 0.9 % injection (  Given 01/14/20 0513)  iohexol (OMNIPAQUE) 300 MG/ML solution 100 mL (100 mLs Intravenous Contrast Given 01/14/20 0258)  HYDROmorphone (DILAUDID) injection 1 mg (1 mg Intravenous Given 01/14/20 0328)    ED Course  I have reviewed the triage vital signs and the nursing notes.  Pertinent labs & imaging results that were available during my care of the patient were reviewed by me and considered in my medical decision making (see chart for details).  Clinical Course as of Jan 13 554  Wed Jan 13, 2020  2358 WBC(!): 24.8 [MV]    Clinical Course User Index [MV] Eustaquio Maize, [place]   MDM Rules/Calculators/A&P                      69 year old female who presents to the ED today complaining of ongoing diffuse abdominal pain for the past 8 days.  Seen in the ED 5 days ago for same and had a CAT scan which showed some inflammation in the rectal area however no acute findings.  Patient was discharged home with positive Torres and advised to follow-up with GI.  Presenting today with ongoing pain that is uncontrolled at home.  Patient is only taking Tylenol at home.  She states that she has felt constipated however has had very small BMs for the past couple of days.  To the ED patient is afebrile, nontachycardic and nontachypneic.  She is uncomfortable appearing.  She is moaning in bed.  Has diffuse abdominal tenderness with voluntary guarding.  Given this we will repeat CAT scan today.  Will work-up with labs at this time.  Fluids given.  CBC with leukocytosis of 24,000.  Is significantly elevated from previous.  Patient denies fevers at home. Will add on lactic acid and blood cultures at this time. Will start on abx.   WBC  Date Value Ref Range Status  01/13/2020 24.8 (H) 4.0 - 10.5 K/uL Final  01/09/2020 10.8 (H) 4.0 - 10.5 K/uL Final  01/05/2020 8.5 3.4 - 10.8 x10E3/uL Final  08/04/2019 9.3 4.0 - 10.5 K/uL Final  07/17/2019 7.8 3.4 - 10.8 x10E3/uL  Final  06/18/2019 10.3 4.0 - 10.5 K/uL Final   CMP with creatinine 1.04 and BUN 25. Glucose 247. Bicarb within normal limits at 25. No gap.  Lipase 23.  Lactic acid 1.6.   Received call from radiologist regarding CT scan - pt does have 9 cm volvulus. Will consult general surgery.   IMPRESSION:  1. Sigmoid volvulus, with distended section of sigmoid colon  measuring up to 9 cm in diameter. No evidence of wall thickening or  pneumatosis.  2. Trace free fluid within the pelvis.  3. Aortic Atherosclerosis (ICD10-I70.0).  4. Lingular ground-glass opacity which could reflect hypoventilatory  change or early pneumonia.   Discussed case with Dr. Ninfa Linden who recommends gastroenterology as surgery only indicated after failed decompression by GI.   Discussed case with Dr. Therisa Doyne who will have morning team come see patient. Pt should be made NPO.   Dr. Josephine Cables with [facility] to admit to medicine. GI following.   6:53 AM Pt actually followed by [facility] Dr. Fuller Plan; spoke with Dr. Henrene Pastor on call for [facility]. Recommends hospitalist consulting inpatient GI team at 8 AM when they come on. They will be happy to see her. Will speak with hospitalist regarding this.   This note was prepared using Dragon voice recognition software and may include unintentional dictation errors due to the  inherent limitations of voice recognition software.   Final Clinical Impression(s) / ED Diagnoses Final diagnoses:  Volvulus ([place])  Generalized abdominal pain    Rx / DC Orders ED Discharge Orders    None          Molpus, John, MD 01/14/20 [place], [place], PA-C 01/14/20 0556    Molpus, John, MD 01/14/20 0645    Eustaquio Maize, PA-C 01/14/20 0654    Molpus, Jenny Reichmann, MD 01/14/20 [phone]

## 2020-01-14 NOTE — Anesthesia Preprocedure Evaluation (Addendum)
Anesthesia Evaluation  Patient identified by MRN, date of birth, ID band Patient awake    Reviewed: Allergy & Precautions, NPO status , Patient's Chart, lab work & pertinent test results, reviewed documented beta blocker date and time   History of Anesthesia Complications Negative for: history of anesthetic complications  Airway Mallampati: II  TM Distance: >3 FB Neck ROM: Full    Dental  (+) Upper Dentures, Loose, Poor Dentition,    Pulmonary Current Smoker,    Pulmonary exam normal        Cardiovascular hypertension, Pt. on medications and Pt. on home beta blockers + Peripheral Vascular Disease  Normal cardiovascular exam     Neuro/Psych negative neurological ROS  negative psych ROS   GI/Hepatic GERD  ,(+) Hepatitis -, CRuptured volvulus   Endo/Other  negative endocrine ROS  Renal/GU negative Renal ROS  negative genitourinary   Musculoskeletal negative musculoskeletal ROS (+)   Abdominal   Peds  Hematology Plavix   Anesthesia Other Findings   Reproductive/Obstetrics                            Anesthesia Physical Anesthesia Plan  ASA: III and emergent  Anesthesia Plan: General   Post-op Pain Management:    Induction: Intravenous, Rapid sequence and Cricoid pressure planned  PONV Risk Score and Plan: 3 and Ondansetron, Dexamethasone, Treatment may vary due to age or medical condition and Midazolam  Airway Management Planned: Oral ETT  Additional Equipment: None  Intra-op Plan:   Post-operative Plan: Extubation in OR and Possible Post-op intubation/ventilation  Informed Consent: I have reviewed the patients History and Physical, chart, labs and discussed the procedure including the risks, benefits and alternatives for the proposed anesthesia with the patient or authorized representative who has indicated his/her understanding and acceptance.     Dental advisory  given  Plan Discussed with:   Anesthesia Plan Comments:        Anesthesia Quick Evaluation

## 2020-01-14 NOTE — ED Notes (Signed)
Urine and culture sent to lab  

## 2020-01-14 NOTE — Transfer of Care (Signed)
Immediate Anesthesia Transfer of Care Note  Patient: ALVERIA WOLANSKI  Procedure(s) Performed: OPEN RIGHT COLECTOMY (N/A Abdomen)  Patient Location: PACU  Anesthesia Type:General  Level of Consciousness: awake, alert  and patient cooperative  Airway & Oxygen Therapy: Patient Spontanous Breathing and Patient connected to face mask oxygen  Post-op Assessment: Report given to RN and Post -op Vital signs reviewed and stable  Post vital signs: Reviewed and stable  Last Vitals:  Vitals Value Taken Time  BP 139/86 01/14/20 1245  Temp    Pulse 94 01/14/20 1246  Resp 23 01/14/20 1246  SpO2 97 % 01/14/20 1246  Vitals shown include unvalidated device data.  Last Pain:  Vitals:   01/14/20 0930  TempSrc:   PainSc: 5          Complications: No apparent anesthesia complications

## 2020-01-14 NOTE — Progress Notes (Signed)
Pink denture box with pt label given to short stay RN. Ring and 7 earrings in box

## 2020-01-14 NOTE — Anesthesia Procedure Notes (Signed)
Central Venous Catheter Insertion Performed by: Lidia Collum, MD, anesthesiologist Patient location: Pre-op. Preanesthetic checklist: patient identified, IV checked, risks and benefits discussed, surgical consent, monitors and equipment checked, pre-op evaluation and anesthesia consent Lidocaine 1% used for infiltration and patient sedated Hand hygiene performed , maximum sterile barriers used  and Seldinger technique used Catheter size: 8 Fr Total catheter length 16. Central line was placed.Double lumen Procedure performed using ultrasound guided technique. Ultrasound Notes:image(s) printed for medical record Attempts: 1 Following insertion, dressing applied, line sutured and Biopatch. Post procedure assessment: blood return through all ports, free fluid flow and no air  Patient tolerated the procedure well with no immediate complications.

## 2020-01-14 NOTE — Consult Note (Addendum)
Pt had surgery today and Vac was applied in the OR.  WOC consult requested for Vac dressing changes and will begin Monday, 5/17 as requested by the surgical team.  Julien Girt MSN, RN, [place], [place], [place]

## 2020-01-14 NOTE — Interval H&P Note (Signed)
History and Physical Interval Note:  01/14/2020 8:19 AM  Margaret Kelly  has presented today for surgery, with the diagnosis of sigmoid volvulus.  The various methods of treatment have been discussed with the patient and family. After consideration of risks, benefits and other options for treatment, the patient has consented to  Procedure(s): FLEXIBLE SIGMOIDOSCOPY (N/A) as a surgical intervention.  The patient's history has been reviewed, patient examined, no change in status, stable for surgery.  I have reviewed the patient's chart and labs.  Questions were answered to the patient's satisfaction.     Nelida Meuse III

## 2020-01-14 NOTE — Anesthesia Procedure Notes (Signed)
Procedure Name: Intubation Date/Time: 01/14/2020 10:32 AM Performed by: Eben Burow, CRNA Pre-anesthesia Checklist: Patient identified, Emergency Drugs available, Suction available, Patient being monitored and Timeout performed Patient Re-evaluated:Patient Re-evaluated prior to induction Oxygen Delivery Method: Circle system utilized Preoxygenation: Pre-oxygenation with 100% oxygen Induction Type: IV induction and Rapid sequence Laryngoscope Size: Glidescope and 3 Grade View: Grade I Tube type: Oral Tube size: 7.0 mm Number of attempts: 1 Airway Equipment and Method: Stylet Placement Confirmation: ETT inserted through vocal cords under direct vision,  positive ETCO2 and breath sounds checked- equal and bilateral Secured at: 21 cm Tube secured with: Tape Dental Injury: Teeth and Oropharynx as per pre-operative assessment

## 2020-01-14 NOTE — Consult Note (Signed)
[facility] Consult Note   History Margaret Kelly MRN # [id number]  Date of Admission: 01/13/2020 Date of Consultation: 01/14/2020 Referring physician: Dr. Rayne Du att. providers found Primary Care Provider: Dorena Dew, FNP Primary Gastroenterologist: Dr. Lucio Edward   Reason for Consultation/Chief Complaint: Sigmoid volvulus  Subjective  HPI:  This is a 69 year old woman, patient of Dr. Lucio Edward with [facility], who presented to the ED overnight with severe generalized abdominal pain and inability to move her bowels or expel gas for the last few days.  She had been suffering from constipation on a regular basis, had a colonoscopy with Dr. Fuller Plan in March of this year.  Over the last several days she was having increasing abdominal pain constipation and bloating with several calls to the office.  Recommendations were given, including advice to come to the emergency department on May 10 when the patient reported being in agony.  She then delayed coming to the ED until last night.  ED provider notes indicate the patient was in severe pain, lab review notes white count of 25,000.  CT scan shows sigmoid volvulus at about 3:00 this morning.  A call was placed to the [facility] GI group, Dr. Alexis Frock, as they were the group on for the [facility] long ED.  Dr. Therisa Doyne apparently spoke to the endoscopy team and put the patient on for 7:30 AM sigmoidoscopy with one of her partners.  I received a message from Dr. Therisa Doyne about an hour ago stating that once they determined this was a patient of [facility], the case would then be given to me.  I immediately spoke to our endoscopy team, asked him to bring the patient over from the ED, and I came immediately to [facility] long hospital.  This patient is in agony right now and cannot give much else in the way of history or review of systems.  She says the pain has been steadily escalating. At the time my evaluation, the surgical PA is also  evaluating her.  ROS:  Patient is in extremis and unable to provide any additional review of systems.  Past Medical History Past Medical History:  Diagnosis Date  . Anxiety   . Blood transfusion without reported diagnosis 1982  . GERD (gastroesophageal reflux disease)   . History of hepatitis C 10/13/2015  . Hyperlipidemia LDL goal <100 10/13/2015  . Hypertension   . Muscle spasm of left lower extremity 10/13/2015  . Nerve damage 2001   left side as result of fall  . PAD (peripheral artery disease) ([place])   . Pain of left hip joint 02/21/2018  . Plantar wart     Past Surgical History Past Surgical History:  Procedure Laterality Date  . ABDOMINAL AORTOGRAM W/LOWER EXTREMITY Bilateral 03/26/2019   Procedure: ABDOMINAL AORTOGRAM W/LOWER EXTREMITY;  Surgeon: Lorretta Harp, MD;  Location: [place] [facility];  Service: Cardiovascular;  Laterality: Bilateral;  . ABDOMINAL AORTOGRAM W/LOWER EXTREMITY Bilateral 08/03/2019   Procedure: ABDOMINAL AORTOGRAM W/LOWER EXTREMITY;  Surgeon: Lorretta Harp, MD;  Location: [place] [facility];  Service: Cardiovascular;  Laterality: Bilateral;  . ABDOMINAL HYSTERECTOMY  1982  . CARPAL TUNNEL RELEASE Left 2005  . foot spur Left 2007   foot  . PERIPHERAL VASCULAR BALLOON ANGIOPLASTY Left 08/03/2019   Procedure: PERIPHERAL VASCULAR BALLOON ANGIOPLASTY;  Surgeon: Lorretta Harp, MD;  Location: [place] [facility];  Service: Cardiovascular;  Laterality: Left;  SFA/ popliteal    Family History Family History  Problem Relation  Age of Onset  . Colon cancer Neg Hx   . Esophageal cancer Neg Hx   . Rectal cancer Neg Hx   . Stomach cancer Neg Hx     Social History Social History   Socioeconomic History  . Marital status: Divorced    Spouse name: Not on file  . Number of children: Not on file  . Years of education: Not on file  . Highest education level: Not on file  Occupational History  . Not on file  Tobacco Use  . Smoking status: Current  Every Day Smoker    Packs/day: 0.50    Types: Cigarettes  . Smokeless tobacco: Former Systems developer  . Tobacco comment: about half a pack per day  Substance and Sexual Activity  . Alcohol use: Yes    Comment: rare  . Drug use: No  . Sexual activity: Not on file  Other Topics Concern  . Not on file  Social History Narrative  . Not on file   Social Determinants of Health   Financial Resource Strain:   . Difficulty of Paying Living Expenses:   Food Insecurity:   . Worried About Charity fundraiser in the Last Year:   . Arboriculturist in the Last Year:   Transportation Needs:   . Film/video editor (Medical):   Marland Kitchen Lack of Transportation (Non-Medical):   Physical Activity:   . Days of Exercise per Week:   . Minutes of Exercise per Session:   Stress:   . Feeling of Stress :   Social Connections:   . Frequency of Communication with Friends and Family:   . Frequency of Social Gatherings with Friends and Family:   . Attends Religious Services:   . Active Member of Clubs or Organizations:   . Attends Archivist Meetings:   Marland Kitchen Marital Status:     Allergies Allergies  Allergen Reactions  . Amlodipine Other (See Comments)    Ringing in ears    Outpatient Meds Home medications from the H+P and/or nursing med reconciliation reviewed.  Inpatient med list reviewed  _____________________________________________________________________ Objective   Exam:  Current vital signs  Patient Vitals for the past 8 hrs:  BP Temp Temp src Pulse Resp SpO2 Height Weight  01/14/20 0736 (!) 158/79 97.9 F (36.6 C) Oral 97 18 98 % 5\' 6"  (1.676 m) 70.8 kg  01/14/20 0700 135/63 -- -- 91 -- 97 % -- --  01/14/20 0600 (!) 141/68 -- -- 87 19 97 % -- --  01/14/20 0400 133/62 -- -- 92 20 93 % -- --  01/14/20 0250 (!) 144/79 -- -- 84 19 95 % -- --  01/14/20 0000 (!) 161/76 97.9 F (36.6 C) -- 73 19 96 % -- --   No intake or output data in the 24 hours ending 01/14/20 0758  Physical  Exam:    General: this is an acutely ill-appearing female patient in distress  Eyes: sclera anicteric, no redness  ENT: oral mucosa moist without lesions  CV: Tachycardic to 110, , no JVD,, no peripheral edema.  Extremities warm well perfused   resp: clear to auscultation bilaterally, tachypneic, low respiratory volumes  GI: soft, generalized but more so left-sided tenderness, with no audible bowel sounds, but difficult to discern over the patient's crying.  Patient says she is in so much pain she is unable to turn from her right side.  She is not visibly distended and not tympanic  Skin; warm and dry, no rash  or jaundice noted  Neuro: Awake, visibly in distress, clutching her abdomen, unable to participate in neuro exam.  Labs:  CBC Latest Ref Rng & Units 01/13/2020 01/09/2020 01/05/2020  WBC 4.0 - 10.5 K/uL 24.8(H) 10.8(H) 8.5  Hemoglobin 12.0 - 15.0 g/dL 15.6(H) 16.1(H) 14.1  Hematocrit 36.0 - 46.0 % 44.4 47.3(H) 41.7  Platelets 150 - 400 K/uL 355 346 336    CMP Latest Ref Rng & Units 01/13/2020 01/09/2020 01/05/2020  Glucose 70 - 99 mg/dL 247(H) 156(H) 132(H)  BUN 8 - 23 mg/dL 25(H) 27(H) 14  Creatinine 0.44 - 1.00 mg/dL 1.04(H) 0.81 0.74  Sodium 135 - 145 mmol/L 134(L) 135 141  Potassium 3.5 - 5.1 mmol/L 3.7 4.3 4.6  Chloride 98 - 111 mmol/L 94(L) 98 106  CO2 22 - 32 mmol/L 25 28 21   Calcium 8.9 - 10.3 mg/dL 9.8 9.5 9.7  Total Protein 6.5 - 8.1 g/dL 7.8 7.9 7.2  Total Bilirubin 0.3 - 1.2 mg/dL 1.4(H) 0.9 0.4  Alkaline Phos 38 - 126 U/L 64 67 85  AST 15 - 41 U/L 24 21 22   ALT 0 - 44 U/L 19 19 19     No results for input(s): INR in the last 168 hours. _________________________________________________________ Radiologic studies:  CLINICAL DATA:  Left lower quadrant pain radiating to midline   EXAM: CT ABDOMEN AND PELVIS WITH CONTRAST   TECHNIQUE: Multidetector CT imaging of the abdomen and pelvis was performed using the standard protocol following bolus administration  of intravenous contrast.   CONTRAST:  176mL OMNIPAQUE IOHEXOL 300 MG/ML  SOLN   COMPARISON:  01/09/2020   FINDINGS: Lower chest: Ground-glass opacity within the lingula could reflect early pneumonia or hypoventilatory change.   Hepatobiliary: No focal liver abnormality is seen. No gallstones, gallbladder wall thickening, or biliary dilatation.   Pancreas: Unremarkable. No pancreatic ductal dilatation or surrounding inflammatory changes.   Spleen: Normal in size without focal abnormality.   Adrenals/Urinary Tract: Adrenal glands are unremarkable. Kidneys are normal, without renal calculi, focal lesion, or hydronephrosis. Bladder is unremarkable.   Stomach/Bowel: There is a large dilated loop of bowel within the lower central abdomen, measuring up to 9 cm in diameter. The appearance and location is consistent with sigmoid volvulus. There is no wall edema or pneumatosis. No bowel obstruction.   Vascular/Lymphatic: Aortic atherosclerosis. No enlarged abdominal or pelvic lymph nodes.   Reproductive: Status post hysterectomy. No adnexal masses.   Other: Trace ascites within the lower pelvis. No free intra-abdominal gas.   Musculoskeletal: There are no acute or destructive bony lesions. Reconstructed images demonstrate no additional findings.   IMPRESSION: 1. Sigmoid volvulus, with distended section of sigmoid colon measuring up to 9 cm in diameter. No evidence of wall thickening or pneumatosis. 2. Trace free fluid within the pelvis. 3.  Aortic Atherosclerosis (ICD10-I70.0). 4. Lingular ground-glass opacity which could reflect hypoventilatory change or early pneumonia.   These results were called by telephone at the time of interpretation on 01/14/2020 at 3:23 am to provider MARGAUX VENTER , who verbally acknowledged these results.     Electronically Signed   By: Randa Ngo M.D.   On: 01/14/2020 03:21  ______________________________________________________ Other  studies:   _______________________________________________________ Assessment & Plan  Impression:  Acute sigmoid volvulus Generalized abdominal pain  This patient is in great danger and needs emergent attempt at sigmoidoscopic decompression. She had marked leukocytosis on labs at 11:30 PM, fortunately was not acidemic at that point.  No repeat labs at this point.  Lactic acid was  1.6 at midnight.  I think there is a high probability she has bowel that is it least ischemic by now.  She has probably not perforated based on exam, but that is likely to occur if we do not attend to this immediately.   I considered a repeat stat CT scan to assess for perforation, but we have endoscopy room available right now and I think the balance of risk/benefit favors receding with an attempt at decompression now. Surgical consultative service has evaluated the patient, Dr. Richardson Landry gross has been made aware the patient's condition.  I discussed sigmoidoscopy with attempted decompression with Ms. Luzadder including review of the usual risks and benefits.  He understands the increased risk of perforation in this case and wishes to proceed.  The benefits and risks of the planned procedure were described in detail with the patient or (when appropriate) their health care proxy.  Risks were outlined as including, but not limited to, bleeding, infection, perforation, adverse medication reaction leading to cardiac or pulmonary decompensation, pancreatitis (if ERCP).  The limitation of incomplete mucosal visualization was also discussed.  No guarantees or warranties were given.  Patient at increased risk for cardiopulmonary complications of procedure due to medical comorbidities.   Plan:  Sigmoidoscopy now for attempted decompression Further plans to follow depending on those findings.  She is certainly being admitted to the hospital, will need repeat labs and close monitoring by medical, GI and surgical services.  If  there is continued clinical concern of infarcted bowel or perforation, further imaging will be warranted.  If perforation does not occur and the bowel remains viable, she will need elective surgical resection of the sigmoid colon.   Thank you for the courtesy of this consult.  Please contact me with any questions or concerns.  Nelida Meuse III Office: [phone]

## 2020-01-14 NOTE — Progress Notes (Signed)
Pt back in endoscopy. Lab at bedside

## 2020-01-14 NOTE — Op Note (Signed)
01/14/2020  12:25 PM  PATIENT:  Margaret Kelly  69 y.o. female  Patient Care Team: Dorena Dew, FNP as PCP - General (Family Medicine) [name], MD as PCP - Cardiology (Cardiology) [name], MD as Consulting Physician (Gastroenterology)  PRE-OPERATIVE DIAGNOSIS:  SIGMOID VOLVULUS  POST-OPERATIVE DIAGNOSIS:  CECAL VOLVULUS with ischemia   PROCEDURE:   EXPLORATORY LAPAROTOMY ILEOCOLECTOMY TAP BLOCK - BILATERAL  SURGEON:  [name], MD  ASSISTANT:   Margie Billet, PA-C Meghann Lange, PA-S, [facility]  ANESTHESIA:     General  Nerve block provided with liposomal bupivacaine (Experel) mixed with 0.25% bupivacaine as a Bilateral TAP block x 25mL each side at the level of the transverse abdominis & preperitoneal spaces along the flank at the anterior axillary line, from subcostal ridge to iliac crest under laparoscopic guidance   Local field block at port sites & extraction wound  EBL:  Total I/O In: 1550 [I.V.:1300; IV Piggyback:250] Out: 600 [Urine:150; Other:400; Blood:50]  Delay start of Pharmacological VTE agent (>24hrs) due to surgical blood loss or risk of bleeding:  no  DRAINS: none   SPECIMEN:  CECAL VOLVULUS (TERMINAL ILEUM TO HEPATIC FLEXURE)  DISPOSITION OF SPECIMEN:  PATHOLOGY  COUNTS:  YES  PLAN OF CARE: Admit to inpatient   PATIENT DISPOSITION:  PACU - guarded condition.  INDICATION:    Patient with history of peripheral disease and hepatitis C.  Severe abdominal pain.  Persisted.  Went to emergency room.  Volvulus suspected.  Emergent gastrology consultation Monday to try and detorsed the volvulus in the sigmoid colon.  Seemed adequate.  Repeat CT showed persistent volvulus.  I recommended emergent surgical resection.  Discussed with the patient and called her son on the phone as well.  Both were in agreement with proceeding with emergency surgery.   The anatomy & physiology of the digestive tract was discussed.  The  pathophysiology of perforation was discussed.  Differential diagnosis such as perforated ulcer or colon, etc was discussed.   Natural history risks without surgery such as death was discussed.  I recommended abdominal exploration to diagnose & treat the source of the problem.  Laparoscopic & open techniques were discussed.   Risks such as bleeding, infection, abscess, leak, reoperation, bowel resection, possible ostomy, injury to other organs, need for repair of tissues / organs, hernia, heart attack, death, and other risks were discussed.   The risks of no intervention will lead to serious problems including death.   I expressed a good likelihood that surgery will address the problem.    Goals of post-operative recovery were discussed as well.  We will work to minimize complications although risks in an emergent setting are high.   Questions were answered.  The patient expressed understanding & wishes to proceed with surgery.      OR FINDINGS:   Patient had a large cecal volvulus with significant ischemia and foul odor suspicious for pockets of gangrene.  Close to but no definite perforation.  No evidence of any sigmoid volvulus.  No obvious metastatic disease on visceral parietal peritoneum or liver.  It is an ileocolonic anastomosis that rests in the hepatic flexure.  CASE DATA:  Type of patient?: LDOW CASE (Surgical Hospitalist [facility] Inpatient)  Status of Case? EMERGENT Add On  Infection Present At Time Of Surgery (PATOS)?  PHLEGMON  DESCRIPTION:   Informed consent was confirmed.   The patient received IV antibiotics and underwent general anesthesia without any difficulty. The patient was positioned appropriately.  Foley catheter had been sterilely placed. SCDs were active during the entire case.  The abdomen was prepped and draped in a sterile fashion.  Surgical timeout confirmed our plan.  Because she was massively distended with giant colon volvulus, I did not think she was a candidate for  immune-based approach.  Therefore, entry was gained through a midline incision.  Large wound protector placed  We immediately encountered a very enlarged and distended colon.  No free air but definite bowel odor suspicious for gangrene.  Too large to come out so I had to extend the incision supraumbilically and down to the pubis.  Eventually was large enough to untwist.  Became apparent that this was actually a cecal volvulus.  Was able to untwist this.  Had clear demarcation.  Hepatic flexure: Viable.  Ileum as well.  No evidence of any perforation or abscess but there was phlegmon and ischemia and gangrene on the colon.  However seemed isolated.  We went ahead and proceeded with transection.  I kept a healthy pedicle on the ileal and middle colic side.  We took the intervening mesentery using bipolar energy and occasional clamps and silk ties as well.  We assured hemostasis.  We had not encountered an abscess.  There is no bleeding.  Patient was actually hypertensive, not hypotensive.  Pathology was isolated.  Therefore we felt it was reasonable to proceed with immediate anastomosis.   I did a side-to-side stapled anastomosis of ileum to hepatic flexure colon using a 51mm GIA stapler.  We then transected the specimen off (including the common bowel defect) using a TX-90 stapler.   I closed off the common mesenteric defect using interrupted silk stitches to avoid any internal hernias.  I protected the TX staple line with the redundant ileocolonic mesentery.  More proximal mesentery closed with V lock.  This provided a nice broad anastomosis with a short pedicle nice and fixed.  We did copious irrigation.  Ran the small bowel from the ileocolonic anastomosis to ligament of Treitz allow the bowel to fall and more naturally.  Nature nasogastric tube was running along the greater curvature the stomach into the antrum.  Got much better decompression that way.  Things perked up.  Everything viable.  No ischemia on  the bowel.    We changed gloves and redraped.  Sterile unused instruments were used from this point out per colon SSI prevention protocol.  I closed the midline incision using #1 PDS running closure.  Because of the foul odor with ischemia pockets of gangrene and phlegmon, we left the subcutaneous tissues open.  I placed an incisional wound VAC sponge to -125.  No ostomy was needed.  Patient is being extubated go to recovery room.  Given the emergent nature and her severe distress, we watch her in the stepdown unit overnight to make sure she does not go into shock.  Antibiotics x5 days.  I discussed operative findings, updated the patient's status, discussed probable steps to recovery, and gave postoperative recommendations to the patient's son, Margaret Kelly.  Recommendations were made.  Questions were answered.  He expressed understanding & appreciation.   [name], MD, FACS, MASCRS Gastrointestinal and Minimally Invasive Surgery  [facility] [address], [place], [place] [address] [phone] Fax [phone] Main/Paging  CONTACT INFORMATION: Weekday (9AM-5PM) concerns: Call [facility] main office at [phone] Weeknight (5PM-9AM) or Weekend/Holiday concerns: Check [email or web address] for General Surgery [facility] coverage (Please, do not use SecureChat as it is not  reliable communication to surgeons for patient care)

## 2020-01-14 NOTE — Progress Notes (Signed)
I updated the patient's status to the patient's son, Arna Berrios.  I explained the seriousness of his mother's condition and the need for emergency surgery.  He agrees to proceed with surgery as well.  Recommendations were made.  Questions were answered.    He expressed understanding & appreciation.  [name], MD, FACS, MASCRS Gastrointestinal and Minimally Invasive Surgery  [facility] [address] [place], [place] [address] [phone] Fax [phone] Main/Paging  CONTACT INFORMATION: Weekday (9AM-5PM) concerns: Call [facility] main office at [phone] Weeknight (5PM-9AM) or Weekend/Holiday concerns: Check [email or web address] for General Surgery [facility] coverage (Please, do not use SecureChat as it is not reliable communication to surgeons for patient care)

## 2020-01-14 NOTE — Anesthesia Postprocedure Evaluation (Signed)
Anesthesia Post Note  Patient: Margaret Kelly  Procedure(s) Performed: OPEN RIGHT COLECTOMY (N/A Abdomen)     Patient location during evaluation: PACU Anesthesia Type: General Level of consciousness: awake and alert Pain management: pain level controlled Vital Signs Assessment: post-procedure vital signs reviewed and stable Respiratory status: spontaneous breathing, nonlabored ventilation, respiratory function stable and patient connected to nasal cannula oxygen Cardiovascular status: blood pressure returned to baseline and stable Postop Assessment: no apparent nausea or vomiting Anesthetic complications: no    Last Vitals:  Vitals:   01/14/20 1400 01/14/20 1415  BP: 137/70 105/89  Pulse: 90 92  Resp: 18 16  Temp:    SpO2: 99% 99%    Last Pain:  Vitals:   01/14/20 1415  TempSrc:   PainSc: Asleep                 Lidia Collum

## 2020-01-14 NOTE — ED Notes (Signed)
Pt is still not able to give urine

## 2020-01-14 NOTE — Consult Note (Signed)
[facility] Surgery Consult Note  Margaret Kelly 1951-07-22  [place]:[id number].    Requesting MD: Mariel Aloe Chief Complaint: Abdominal pain x8 days Reason for Consult: Sigmoid volvulus GI:  Dr. Lucio Edward  HPI:  Patient is a 69 year old female with abdominal pain for 8 days.  She was seen on 01/09/2020 with complaints of constipation.  CT scan at that time showed no acute abnormalities.  There was nonspecific thickening of the distal rectum/anus which might represent soft tissue mass or internal hemorrhoids.  There was no acute abnormalities and there was an average stool burden throughout the colon.  Patient was treated with suppositories and discharge. Patient reported she continued to have progressive pain.  She is taken Tylenol without any relief.  She has had some very small watery stools in the past 2 days after taking Dulcolax suppositories and continues to feel constipated.  She has had some nausea but no vomiting.  She was seen in February by her PCP and [facility] with abdominal pain she has a history of a 10 mm hyperplastic polyp that was removed in 2015, internal hemorrhoids, rectal prolapse and diverticulosis.  She was found to have mild focal inflammatory changes left lower quadrant near the descending/proximal sigmoid colon with mild colon wall thickening thought to be secondary to acute diverticulitis.  She was treated for diverticulitis and underwent colonoscopy on 11/10/2019, by Dr. Lucio Edward.  Colonoscopy findings: Hemorrhoids and rectal prolapse were found on perianal exam. - A 8 mm polyp was found in the sigmoid colon. The polyp was sessile. The polyp was removed with a cold snare. Resection and retrieval were complete. - Multiple medium-mouthed diverticula were found in the left colon. There was no evidence of diverticular bleeding. - Internal hemorrhoids were found during retroflexion. The hemorrhoids were moderate and Grade I (internal hemorrhoids that do not  prolapse). - The exam was otherwise without abnormality on direct and retroflexion views.  Work-up in the ED shows she is afebrile vital signs are stable.  Labs shows a sodium 134, potassium 3.7, chloride 94, glucose of 247, BUN of 25 creatinine 1.04 total bilirubin of 1.4 with the remaining LFTs normal.  WBC 24.8, hemoglobin 15.6, hematocrit 44.4, platelets 355,000.  Urinalysis is unremarkable, COVID is negative.  CT of the abdomen/pelvis with contrast shows groundglass opacities in the lingula could reflect an early pneumonia.  There is a large dilated loop of bowel within the central abdomen measuring 9 cm in diameter the appearance and location is consistent with a sigmoid volvulus.  There was no wall edema or pneumatosis and no bowel obstruction.  Dr. Therisa Doyne was also contacted and plan to see the patient in the a.m.  Past medical history significant for hypertension, hyperlipidemia, PAD on Plavix, chronic tobacco use, GERD, and hepatitis C.  ROS: Review of Systems  Unable to perform ROS: Severity of pain    Family History  Problem Relation Age of Onset  . Colon cancer Neg Hx   . Esophageal cancer Neg Hx   . Rectal cancer Neg Hx   . Stomach cancer Neg Hx     Past Medical History:  Diagnosis Date  . Anxiety   . Blood transfusion without reported diagnosis 1982  . GERD (gastroesophageal reflux disease)   . History of hepatitis C 10/13/2015  . Hyperlipidemia LDL goal <100 10/13/2015  . Hypertension   . Muscle spasm of left lower extremity 10/13/2015  . Nerve damage 2001   left side as result of fall  . PAD (  peripheral artery disease) ([place])   . Pain of left hip joint 02/21/2018  . Plantar wart     Past Surgical History:  Procedure Laterality Date  . ABDOMINAL AORTOGRAM W/LOWER EXTREMITY Bilateral 03/26/2019   Procedure: ABDOMINAL AORTOGRAM W/LOWER EXTREMITY;  Surgeon: Lorretta Harp, MD;  Location: [facility];  Service: Cardiovascular;  Laterality: Bilateral;  . ABDOMINAL  AORTOGRAM W/LOWER EXTREMITY Bilateral 08/03/2019   Procedure: ABDOMINAL AORTOGRAM W/LOWER EXTREMITY;  Surgeon: Lorretta Harp, MD;  Location: [facility];  Service: Cardiovascular;  Laterality: Bilateral;  . ABDOMINAL HYSTERECTOMY  1982  . CARPAL TUNNEL RELEASE Left 2005  . foot spur Left 2007   foot  . PERIPHERAL VASCULAR BALLOON ANGIOPLASTY Left 08/03/2019   Procedure: PERIPHERAL VASCULAR BALLOON ANGIOPLASTY;  Surgeon: Lorretta Harp, MD;  Location: [facility];  Service: Cardiovascular;  Laterality: Left;  SFA/ popliteal    Social History:  reports that she has been smoking cigarettes. She has been smoking about 0.50 packs per day. She has quit using smokeless tobacco. She reports current alcohol use. She reports that she does not use drugs.  Allergies:  Allergies  Allergen Reactions  . Amlodipine Other (See Comments)    Ringing in ears    Prior to Admission medications   Medication Sig Start Date End Date Taking? Authorizing Provider  acetaminophen (TYLENOL) 500 MG tablet Take 1,000 mg by mouth every 6 (six) hours as needed for mild pain.   Yes [provider]  aspirin EC 81 MG tablet Take 81 mg by mouth daily.   Yes [provider]  carvedilol (COREG) 25 MG tablet TAKE 1 TABLET(25 MG) BY MOUTH TWICE DAILY Patient taking differently: Take 25 mg by mouth in the morning and at bedtime.  09/23/19  Yes Turner, Eber Hong, MD  cilostazol (PLETAL) 50 MG tablet TAKE 1 TABLET(50 MG) BY MOUTH TWICE DAILY Patient taking differently: Take 50 mg by mouth 2 (two) times daily.  10/12/19  Yes Lorretta Harp, MD  clopidogrel (PLAVIX) 75 MG tablet Take 1 tablet (75 mg total) by mouth daily with breakfast. 10/22/19  Yes Bhagat, Bhavinkumar, PA  cyclobenzaprine (FLEXERIL) 10 MG tablet Take 1 tablet (10 mg total) by mouth at bedtime as needed. Patient taking differently: Take 10 mg by mouth at bedtime as needed for muscle spasms.  12/16/19  Yes Dorena Dew, FNP   ezetimibe (ZETIA) 10 MG tablet TAKE 1 TABLET(10 MG) BY MOUTH DAILY Patient taking differently: Take 10 mg by mouth daily.  12/18/19  Yes Weaver, Scott T, PA-C  Fish Oil-Cholecalciferol (FISH OIL + D3) 1200-1000 MG-UNIT CAPS Take 1 capsule by mouth daily.   Yes [provider]  losartan (COZAAR) 100 MG tablet TAKE 1 TABLET BY MOUTH DAILY Patient taking differently: Take 100 mg by mouth daily.  06/08/19  Yes Jegede, Marlena Clipper, MD  MAGNESIUM CITRATE PO Take 480 mg by mouth 2 (two) times a day.   Yes [provider]  Melatonin 12 MG TABS Take 12 mg by mouth at bedtime.   Yes [provider]  Multiple Vitamin (MULTIVITAMIN WITH MINERALS) TABS tablet Take 1 tablet by mouth daily. Women's One-A-Day    Yes [provider]  pantoprazole (PROTONIX) 40 MG tablet Take 1 tablet (40 mg total) by mouth daily. 12/07/19  Yes Dorena Dew, FNP  rosuvastatin (CRESTOR) 10 MG tablet Take 10 mg by mouth at bedtime. 01/05/20  Yes [provider]  acetaminophen-codeine (TYLENOL #3) 300-30 MG tablet  Take 1 tablet by mouth every 6 (six) hours as needed for moderate pain. Patient not taking: Reported on 10/06/2019 09/25/19   Dorena Dew, FNP  belladonna-opium (B&O SUPPRETTES) 16.2-30 MG suppository Place 1 suppository rectally every 8 (eight) hours as needed for pain. 01/09/20   Caccavale, Sophia, PA-C  hydrochlorothiazide (HYDRODIURIL) 12.5 MG tablet Take 1 tablet (12.5 mg total) by mouth daily. Patient not taking: Reported on 01/14/2020 01/05/20   Dorena Dew, FNP  hydrocortisone (ANUSOL-HC) 25 MG suppository Place 1 suppository (25 mg total) rectally 2 (two) times daily. 01/09/20   Carmin Muskrat, MD  hyoscyamine (LEVSIN SL) 0.125 MG SL tablet Place 1 tablet (0.125 mg total) under the tongue every 6 (six) hours as needed. Patient not taking: Reported on 01/09/2020 10/09/19   Zehr, Laban Emperor, PA-C  varenicline (CHANTIX CONTINUING MONTH PAK) 1 MG tablet Take 1 tablet (1 mg  total) by mouth 2 (two) times daily. Patient not taking: Reported on 01/09/2020 08/24/19   [name], MD  varenicline (CHANTIX STARTING MONTH PAK) 0.5 MG X 11 & 1 MG X 42 tablet Take one 0.5 mg tablet by mouth once daily for 3 days, then increase to one 0.5 mg tablet twice daily for 4 days, then increase to one 1 mg tablet twice daily. Patient not taking: Reported on 01/05/2020 08/24/19   [name], MD     Blood pressure (!) 141/68, pulse 87, temperature 97.9 F (36.6 C), resp. rate 19, SpO2 97 %. Physical Exam General:elderly appearing WF in acute distress and unremitting pain HEENT: head is normocephalic, atraumatic.  Sclera are noninjected.  Pupils are equal  Ears and nose without any masses or lesions.  Mouth is pink and moist Heart: regular, rate, and rhythm.  Normal s1,s2. No obvious murmurs, gallops, or rubs noted.  Palpable radial and pedal pulses bilaterally Lungs: CTAB, no wheezes, rhonchi, or rales noted.  Respiratory effort nonlabored Abd: distended in extreme pain, diffuse abdominal pain, no BS,  MS: all 4 extremities are symmetrical with no cyanosis, clubbing, or edema. Skin: warm and dry with no masses, lesions, or rashes Neuro: Cranial nerves 2-12 grossly intact, sensation is normal throughout Psych: A&Ox3 with an appropriate affect.   Results for orders placed or performed during the hospital encounter of 01/13/20 (from the past 48 hour(s))  Comprehensive metabolic panel     Status: Abnormal   Collection Time: 01/13/20 11:31 PM  Result Value Ref Range   Sodium 134 (L) 135 - 145 mmol/L   Potassium 3.7 3.5 - 5.1 mmol/L   Chloride 94 (L) 98 - 111 mmol/L   CO2 25 22 - 32 mmol/L   Glucose, Bld 247 (H) 70 - 99 mg/dL    Comment: Glucose reference range applies only to samples taken after fasting for at least 8 hours.   BUN 25 (H) 8 - 23 mg/dL   Creatinine, Ser 1.04 (H) 0.44 - 1.00 mg/dL   Calcium 9.8 8.9 - 10.3 mg/dL   Total Protein 7.8 6.5 - 8.1 g/dL   Albumin  4.3 3.5 - 5.0 g/dL   AST 24 15 - 41 U/L   ALT 19 0 - 44 U/L   Alkaline Phosphatase 64 38 - 126 U/L   Total Bilirubin 1.4 (H) 0.3 - 1.2 mg/dL   GFR calc non Af Amer 56 (L) >60 mL/min   GFR calc Af Amer >60 >60 mL/min   Anion gap 15 5 - 15    Comment: Performed at [facility], [place] [address] [place], [place] [address]  Lipase, blood     Status: None   Collection Time: 01/13/20 11:31 PM  Result Value Ref Range   Lipase 23 11 - 51 U/L    Comment: Performed at [facility], [place] [address], [place], [place] [address]  CBC with Differential     Status: Abnormal   Collection Time: 01/13/20 11:31 PM  Result Value Ref Range   WBC 24.8 (H) 4.0 - 10.5 K/uL   RBC 4.72 3.87 - 5.11 MIL/uL   Hemoglobin 15.6 (H) 12.0 - 15.0 g/dL   HCT 44.4 36.0 - 46.0 %   MCV 94.1 80.0 - 100.0 fL   MCH 33.1 26.0 - 34.0 pg   MCHC 35.1 30.0 - 36.0 g/dL   RDW 11.9 11.5 - 15.5 %   Platelets 355 150 - 400 K/uL   nRBC 0.0 0.0 - 0.2 %   Neutrophils Relative % 82 %   Neutro Abs 20.1 (H) 1.7 - 7.7 K/uL   Lymphocytes Relative 7 %   Lymphs Abs 1.7 0.7 - 4.0 K/uL   Monocytes Relative 11 %   Monocytes Absolute 2.8 (H) 0.1 - 1.0 K/uL   Eosinophils Relative 0 %   Eosinophils Absolute 0.0 0.0 - 0.5 K/uL   Basophils Relative 0 %   Basophils Absolute 0.1 0.0 - 0.1 K/uL   Immature Granulocytes 0 %   Abs Immature Granulocytes 0.11 (H) 0.00 - 0.07 K/uL    Comment: Performed at [facility], [place] [address], [place], [place] [address]  Lactic acid, plasma     Status: None   Collection Time: 01/14/20 12:57 AM  Result Value Ref Range   Lactic Acid, Venous 1.6 0.5 - 1.9 mmol/L    Comment: Performed at [facility], [place] [address] [place], [place] [address]  SARS Coronavirus 2 by RT PCR (hospital order, performed in [facility] hospital lab) Nasopharyngeal Nasopharyngeal Swab     Status: None   Collection Time: 01/14/20  3:23 AM   Specimen:  Nasopharyngeal Swab  Result Value Ref Range   SARS Coronavirus 2 NEGATIVE NEGATIVE    Comment: (NOTE) SARS-CoV-2 target nucleic acids are NOT DETECTED. The SARS-CoV-2 RNA is generally detectable in upper and lower respiratory specimens during the acute phase of infection. The lowest concentration of SARS-CoV-2 viral copies this assay can detect is 250 copies / mL. A negative result does not preclude SARS-CoV-2 infection and should not be used as the sole basis for treatment or other patient management decisions.  A negative result may occur with improper specimen collection / handling, submission of specimen other than nasopharyngeal swab, presence of viral mutation(s) within the areas targeted by this assay, and inadequate number of viral copies (<250 copies / mL). A negative result must be combined with clinical observations, patient history, and epidemiological information. Fact Sheet for Patients:   StrictlyIdeas.no Fact Sheet for Healthcare Providers: BankingDealers.co.za This test is not yet approved or cleared  by the [place] FDA and has been authorized for detection and/or diagnosis of SARS-CoV-2 by FDA under an Emergency Use Authorization (EUA).  This EUA will remain in effect (meaning this test can be used) for the duration of the COVID-19 declaration under Section 564(b)(1) of the Act, 21 U.S.C. section 360bbb-3(b)(1), unless the authorization is terminated or revoked sooner. Performed at [facility], [place] [address] [place], [place] [address]   Urinalysis, Routine w reflex microscopic  Status: Abnormal   Collection Time: 01/14/20  4:27 AM  Result Value Ref Range   Color, Urine YELLOW YELLOW   APPearance CLEAR CLEAR   Specific Gravity, Urine 1.005 1.005 - 1.030   pH 5.0 5.0 - 8.0   Glucose, UA NEGATIVE NEGATIVE mg/dL   Hgb urine dipstick NEGATIVE NEGATIVE   Bilirubin Urine NEGATIVE NEGATIVE    Ketones, ur 5 (A) NEGATIVE mg/dL   Protein, ur 30 (A) NEGATIVE mg/dL   Nitrite NEGATIVE NEGATIVE   Leukocytes,Ua NEGATIVE NEGATIVE   RBC / HPF 0-5 0 - 5 RBC/hpf   WBC, UA 0-5 0 - 5 WBC/hpf   Bacteria, UA NONE SEEN NONE SEEN   Squamous Epithelial / LPF 0-5 0 - 5   Mucus PRESENT     Comment: Performed at [facility], [place] [address] [place], [place] [address]   CT Abdomen Pelvis W Contrast  Result Date: 01/14/2020 CLINICAL DATA:  Left lower quadrant pain radiating to midline EXAM: CT ABDOMEN AND PELVIS WITH CONTRAST TECHNIQUE: Multidetector CT imaging of the abdomen and pelvis was performed using the standard protocol following bolus administration of intravenous contrast. CONTRAST:  167mL OMNIPAQUE IOHEXOL 300 MG/ML  SOLN COMPARISON:  01/09/2020 FINDINGS: Lower chest: Ground-glass opacity within the lingula could reflect early pneumonia or hypoventilatory change. Hepatobiliary: No focal liver abnormality is seen. No gallstones, gallbladder wall thickening, or biliary dilatation. Pancreas: Unremarkable. No pancreatic ductal dilatation or surrounding inflammatory changes. Spleen: Normal in size without focal abnormality. Adrenals/Urinary Tract: Adrenal glands are unremarkable. Kidneys are normal, without renal calculi, focal lesion, or hydronephrosis. Bladder is unremarkable. Stomach/Bowel: There is a large dilated loop of bowel within the lower central abdomen, measuring up to 9 cm in diameter. The appearance and location is consistent with sigmoid volvulus. There is no wall edema or pneumatosis. No bowel obstruction. Vascular/Lymphatic: Aortic atherosclerosis. No enlarged abdominal or pelvic lymph nodes. Reproductive: Status post hysterectomy. No adnexal masses. Other: Trace ascites within the lower pelvis. No free intra-abdominal gas. Musculoskeletal: There are no acute or destructive bony lesions. Reconstructed images demonstrate no additional findings. IMPRESSION: 1. Sigmoid  volvulus, with distended section of sigmoid colon measuring up to 9 cm in diameter. No evidence of wall thickening or pneumatosis. 2. Trace free fluid within the pelvis. 3.  Aortic Atherosclerosis (ICD10-I70.0). 4. Lingular ground-glass opacity which could reflect hypoventilatory change or early pneumonia. These results were called by telephone at the time of interpretation on 01/14/2020 at 3:23 am to provider MARGAUX VENTER , who verbally acknowledged these results. Electronically Signed   By: Randa Ngo M.D.   On: 01/14/2020 03:21   DG Chest Port 1 View  Result Date: 01/14/2020 CLINICAL DATA:  Abdominal pain EXAM: PORTABLE CHEST 1 VIEW COMPARISON:  10/05/2013 FINDINGS: Single frontal view of the chest demonstrates an unremarkable cardiac silhouette. The ground-glass density within the lingula seen on corresponding CT is not visible by radiograph. No airspace disease, effusion, or pneumothorax on this exam. No acute bony abnormalities. IMPRESSION: 1. Stable chest, no acute process. Ground-glass density on abdominal CT likely reflects hypoventilatory change. Electronically Signed   By: Randa Ngo M.D.   On: 01/14/2020 03:51      Assessment/Plan Hx hypertension PAD on Plavix/Pletal - LD yesterday Chronic tobacco use GERD Hepatitis C   Sigmoid Volvulus Hx rectal polyps, rectal prolapse, diverticulitis, hemorrhoids   Plan:  Dr. [name] is going to do an emergent Flexible Sigmoidoscopy to try and decompress her. There is concern she may be perforated. If she  is not relieved by Sigmoidoscopy she may need emergent partial colectomy, and Hartman's procedure.      Earnstine [place] [facility] 01/14/2020, 6:44 AM Please see [facility]ion for pager number during day hours 7:00am-4:30pm

## 2020-01-14 NOTE — ED Notes (Signed)
Pt refusing to go to CT until she gets more pain medication. PA made aware

## 2020-01-14 NOTE — Progress Notes (Signed)
Sigmoidoscopy report on file in Epic - volvulus not fully decompressed.  Patient still in considerable pain post-op.  Sent now for STAT non-contrast CTAP, ordered STAT CBC/CMP/INR  Spoke to Dr. Neysa Bonito and reviewed case because will need operative management.  He feels patient should go to medical service due to history of vascular disease and use of plavix.  I have a call out to Dr. Lupita Leash of [facility] service to discuss and plan admission.  Patient will be given pain medicine.  Further plans pending CT report.

## 2020-01-14 NOTE — Progress Notes (Signed)
CT reviewed with radiology - unchanged from initial CT and no perforation seen.  Patient more comfortable after fentanyl.  Labs being drawn now. Dr. Johney Maine saw patient and will take her to the OR urgently and then on his service.  Have spoken to Dr. Lupita Leash of [facility] for medical consult.

## 2020-01-15 LAB — CBC WITH DIFFERENTIAL/PLATELET
Abs Immature Granulocytes: 0.8 10*3/uL — ABNORMAL HIGH (ref 0.00–0.07)
Band Neutrophils: 13 %
Basophils Absolute: 0 10*3/uL (ref 0.0–0.1)
Basophils Relative: 0 %
Eosinophils Absolute: 0 10*3/uL (ref 0.0–0.5)
Eosinophils Relative: 0 %
HCT: 31.1 % — ABNORMAL LOW (ref 36.0–46.0)
Hemoglobin: 10.7 g/dL — ABNORMAL LOW (ref 12.0–15.0)
Lymphocytes Relative: 8 %
Lymphs Abs: 1.3 10*3/uL (ref 0.7–4.0)
MCH: 33.3 pg (ref 26.0–34.0)
MCHC: 34.4 g/dL (ref 30.0–36.0)
MCV: 96.9 fL (ref 80.0–100.0)
Metamyelocytes Relative: 2 %
Monocytes Absolute: 0.6 10*3/uL (ref 0.1–1.0)
Monocytes Relative: 4 %
Myelocytes: 3 %
Neutro Abs: 13.1 10*3/uL — ABNORMAL HIGH (ref 1.7–7.7)
Neutrophils Relative %: 70 %
Platelets: 237 10*3/uL (ref 150–400)
RBC: 3.21 MIL/uL — ABNORMAL LOW (ref 3.87–5.11)
RDW: 12.4 % (ref 11.5–15.5)
WBC: 15.8 10*3/uL — ABNORMAL HIGH (ref 4.0–10.5)
nRBC: 0 % (ref 0.0–0.2)

## 2020-01-15 LAB — COMPREHENSIVE METABOLIC PANEL
ALT: 14 U/L (ref 0–44)
AST: 20 U/L (ref 15–41)
Albumin: 2.4 g/dL — ABNORMAL LOW (ref 3.5–5.0)
Alkaline Phosphatase: 35 U/L — ABNORMAL LOW (ref 38–126)
Anion gap: 9 (ref 5–15)
BUN: 15 mg/dL (ref 8–23)
CO2: 28 mmol/L (ref 22–32)
Calcium: 7.8 mg/dL — ABNORMAL LOW (ref 8.9–10.3)
Chloride: 100 mmol/L (ref 98–111)
Creatinine, Ser: 0.51 mg/dL (ref 0.44–1.00)
GFR calc Af Amer: >60 mL/min (ref >60)
GFR calc non Af Amer: >60 mL/min (ref >60)
Glucose, Bld: 135 mg/dL — ABNORMAL HIGH (ref 70–99)
Potassium: 3.7 mmol/L (ref 3.5–5.1)
Sodium: 137 mmol/L (ref 135–145)
Total Bilirubin: 1.1 mg/dL (ref 0.3–1.2)
Total Protein: 5 g/dL — ABNORMAL LOW (ref 6.5–8.1)

## 2020-01-15 LAB — SURGICAL PATHOLOGY

## 2020-01-15 LAB — GLUCOSE, CAPILLARY
Glucose-Capillary: 111 mg/dL — ABNORMAL HIGH (ref 70–99)
Glucose-Capillary: 122 mg/dL — ABNORMAL HIGH (ref 70–99)
Glucose-Capillary: 116 mg/dL — ABNORMAL HIGH (ref 70–99)
Glucose-Capillary: 107 mg/dL — ABNORMAL HIGH (ref 70–99)
Glucose-Capillary: 89 mg/dL (ref 70–99)

## 2020-01-15 LAB — PHOSPHORUS: Phosphorus: 2.4 mg/dL — ABNORMAL LOW (ref 2.5–4.6)

## 2020-01-15 LAB — MAGNESIUM: Magnesium: 1.7 mg/dL (ref 1.7–2.4)

## 2020-01-15 MED ORDER — HYDROMORPHONE HCL 1 MG/ML IJ SOLN
1.0000 mg | INTRAMUSCULAR | Status: DC | PRN
  Administered 2020-01-15: 2 mg via INTRAVENOUS
  Administered 2020-01-15: 1 mg via INTRAVENOUS
  Administered 2020-01-16 (×2): 2 mg via INTRAVENOUS
  Filled 2020-01-15 (×3): qty 2
  Filled 2020-01-15: qty 1

## 2020-01-15 MED ORDER — MENTHOL 3 MG MT LOZG
1.0000 | LOZENGE | OROMUCOSAL | Status: DC | PRN
  Filled 2020-01-15: qty 9

## 2020-01-15 NOTE — Consult Note (Signed)
Medical Consultation   Margaret Kelly  [id number]  DOB: 28-May-1951  DOA: 01/13/2020  PCP: Dorena Dew, FNP    Requesting physician: Dr. Michael Boston ([facility])  Reason for consultation: Multiple medical problems   History of Present Illness:  69 year old WF PMHx anxiety, HTN, PAD, HLD, nerve damage left side secondary to fall, LEFT hip pain, Hx hepatitis C. tobacco abuse   Woman, patient of Dr. Lucio Edward with [facility], who presented to the ED overnight with severe generalized abdominal pain and inability to move her bowels or expel gas for the last few days.  She had been suffering from constipation on a regular basis, had a colonoscopy with Dr. Fuller Plan in March of this year.  Over the last several days she was having increasing abdominal pain constipation and bloating with several calls to the office.  Recommendations were given, including advice to come to the emergency department on May 10 when the patient reported being in agony.  She then delayed coming to the ED until last night.  ED provider notes indicate the patient was in severe pain, lab review notes white count of 25,000.  CT scan shows sigmoid volvulus at about 3:00 this morning.  A call was placed to the [facility] GI group, Dr. Alexis Frock, as they were the group on for the [facility] long ED.  Dr. Therisa Doyne apparently spoke to the endoscopy team and put the patient on for 7:30 AM sigmoidoscopy with one of her partners.  I received a message from Dr. Therisa Doyne about an hour ago stating that once they determined this was a patient of [facility], the case would then be given to me.  I immediately spoke to our endoscopy team, asked him to bring the patient over from the ED, and I came immediately to [facility] long hospital.  This patient is in agony right now and cannot give much else in the way of history or review of systems.  She says the pain has been steadily escalating. At the time my evaluation, the surgical PA is  also evaluating her.   5/14 afebrile overnight, A/O x4, negative S OB.  Positive abdominal pain especially when she coughs.  Nausea controlled on current regimen.  Abdominal pain not controlled on current regimen    Review of Systems:  Review of Systems  Constitutional: Negative.   HENT: Negative.   Eyes: Negative.   Respiratory: Negative.   Cardiovascular: Negative.   Gastrointestinal: Positive for abdominal pain and nausea. Negative for diarrhea and vomiting.  Genitourinary: Negative.   Musculoskeletal: Negative.   Skin: Negative.   Endo/Heme/Allergies: Negative.   Psychiatric/Behavioral: Negative for depression, hallucinations, memory loss, substance abuse and suicidal ideas. The patient is nervous/anxious. The patient does not have insomnia.      Past Medical History: Past Medical History:  Diagnosis Date  . Anxiety   . Blood transfusion without reported diagnosis 1982  . GERD (gastroesophageal reflux disease)   . History of hepatitis C 10/13/2015  . Hyperlipidemia LDL goal <100 10/13/2015  . Hypertension   . Muscle spasm of left lower extremity 10/13/2015  . Nerve damage 2001   left side as result of fall  . PAD (peripheral artery disease) ([place])   . Pain of left hip joint 02/21/2018  . Plantar wart     Past Surgical History: Past Surgical History:  Procedure Laterality Date  . ABDOMINAL AORTOGRAM W/LOWER EXTREMITY Bilateral 03/26/2019  Procedure: ABDOMINAL AORTOGRAM W/LOWER EXTREMITY;  Surgeon: Lorretta Harp, MD;  Location: [place] [facility];  Service: Cardiovascular;  Laterality: Bilateral;  . ABDOMINAL AORTOGRAM W/LOWER EXTREMITY Bilateral 08/03/2019   Procedure: ABDOMINAL AORTOGRAM W/LOWER EXTREMITY;  Surgeon: Lorretta Harp, MD;  Location: [place] [facility];  Service: Cardiovascular;  Laterality: Bilateral;  . ABDOMINAL HYSTERECTOMY  1982  . CARPAL TUNNEL RELEASE Left 2005  . foot spur Left 2007   foot  . LAPAROTOMY N/A 01/14/2020   Procedure: OPEN RIGHT  COLECTOMY, EXPLORATORY LAPAROTOMY;  Surgeon: Michael Boston, MD;  Location: [facility];  Service: General;  Laterality: N/A;  . PERIPHERAL VASCULAR BALLOON ANGIOPLASTY Left 08/03/2019   Procedure: PERIPHERAL VASCULAR BALLOON ANGIOPLASTY;  Surgeon: Lorretta Harp, MD;  Location: [place] [facility];  Service: Cardiovascular;  Laterality: Left;  SFA/ popliteal     Allergies:   Allergies  Allergen Reactions  . Amlodipine Other (See Comments)    Ringing in ears     Social History:  reports that she has been smoking cigarettes. She has been smoking about 0.50 packs per day. She has quit using smokeless tobacco. She reports current alcohol use. She reports that she does not use drugs.   Family History: Family History  Problem Relation Age of Onset  . Colon cancer Neg Hx   . Esophageal cancer Neg Hx   . Rectal cancer Neg Hx   . Stomach cancer Neg Hx      Procedures/Significant Events:  5/13;EXPLORATORY LAPAROTOMY, ILEOCOLECTOMY, TAP BLOCK - BILATERAL    I have personally reviewed and interpreted all radiology studies and my findings are as above.  VENTILATOR SETTINGS: Nasal cannula 5/14 Flow 2 L/min SPO2 100%    Cultures   Antimicrobials: Anti-infectives (From admission, onward)   Start     Dose/Rate Stop   01/14/20 1730  piperacillin-tazobactam (ZOSYN) IVPB 3.375 g     3.375 g 12.5 mL/hr over 240 Minutes 01/19/20 1359   01/14/20 1001  sodium chloride 0.9 % with cefoTEtan (CEFOTAN) ADS Med    Note to Pharmacy: Jefm Miles   : cabinet override    01/14/20 1059   01/14/20 0945  cefoTEtan (CEFOTAN) 2 g in sodium chloride 0.9 % 100 mL IVPB     2 g 200 mL/hr over 30 Minutes 01/14/20 1040   01/14/20 0015  cefTRIAXone (ROCEPHIN) 2 g in sodium chloride 0.9 % 100 mL IVPB     2 g 200 mL/hr over 30 Minutes 01/14/20 0158   01/14/20 0015  metroNIDAZOLE (FLAGYL) IVPB 500 mg     500 mg 100 mL/hr over 60 Minutes 01/14/20 0243       Devices    LINES / TUBES:       Continuous Infusions: . acetaminophen Stopped (01/15/20 0724)  . chlorproMAZINE (THORAZINE) IV    . lactated ringers    . lactated ringers 125 mL/hr at 01/15/20 1100  . methocarbamol (ROBAXIN) IV    . piperacillin-tazobactam (ZOSYN)  IV 12.5 mL/hr at 01/15/20 1100     Physical Exam: Vitals:   01/15/20 0400 01/15/20 0600 01/15/20 0800 01/15/20 1000  BP: (!) 113/51 (!) 128/56 (!) 131/58 (!) 116/53  Pulse: 79 84 81 77  Resp: 11 20 17 13   Temp: (!) 97.5 F (36.4 C)  98.4 F (36.9 C)   TempSrc: Axillary  Oral   SpO2: 97% 98% 96% 96%  Weight:      Height:       Physical Exam:  General: A/O x4, positive  acute respiratory distress Eyes: negative scleral hemorrhage, negative anisocoria, negative icterus ENT: Negative Runny nose, negative gingival bleeding, Neck:  Negative scars, masses, torticollis, lymphadenopathy, JVD Lungs: Clear to auscultation bilaterally without wheezes or crackles Cardiovascular: Regular rate and rhythm without murmur gallop or rub normal S1 and S2 Abdomen: Positive abdominal pain, positive distended, negative soft, bowel sounds, no rebound, no ascites, no appreciable mass, wound VAC in place midline lower abdomen currently no fluid (patient states just changed approximately 1 hour ago) Extremities: No significant cyanosis, clubbing, or edema bilateral lower extremities Skin: Negative rashes, lesions, ulcers Psychiatric:  Negative depression, negative anxiety, negative fatigue, negative mania  Central nervous system:  Cranial nerves II through XII intact, tongue/uvula midline, all extremities muscle strength 5/5, sensation intact throughout,  negative dysarthria, negative expressive aphasia, negative receptive aphasia.    Data reviewed:  I have personally reviewed following labs and imaging studies Labs:  CBC: Recent Labs  Lab 01/09/20 1043 01/13/20 2331 01/14/20 0950 01/15/20 0800  WBC 10.8* 24.8* 26.8* 15.8*  NEUTROABS  --  20.1*  --  13.1*   HGB 16.1* 15.6* 14.4 10.7*  HCT 47.3* 44.4 41.3 31.1*  MCV 95.4 94.1 95.2 96.9  PLT 346 355 306 [id number]    Basic Metabolic Panel: Recent Labs  Lab 01/09/20 1043 01/09/20 1043 01/13/20 2331 01/13/20 2331 01/14/20 0950 01/15/20 0800  NA 135  --  134*  --  132* 137  K 4.3   < > 3.7   < > 3.6 3.7  CL 98  --  94*  --  95* 100  CO2 28  --  25  --  24 28  GLUCOSE 156*  --  247*  --  233* 135*  BUN 27*  --  25*  --  21 15  CREATININE 0.81  --  1.04*  --  0.83 0.51  CALCIUM 9.5  --  9.8  --  8.7* 7.8*  MG  --   --   --   --   --  1.7  PHOS  --   --   --   --   --  2.4*   < > = values in this interval not displayed.   GFR Estimated Creatinine Clearance: 64.8 mL/min (by C-G formula based on SCr of 0.51 mg/dL). Liver Function Tests: Recent Labs  Lab 01/09/20 1043 01/13/20 2331 01/14/20 0950 01/15/20 0800  AST 21 24 19 20   ALT 19 19 17 14   ALKPHOS 67 64 54 35*  BILITOT 0.9 1.4* 1.0 1.1  PROT 7.9 7.8 6.6 5.0*  ALBUMIN 4.4 4.3 3.2* 2.4*   Recent Labs  Lab 01/09/20 1043 01/13/20 2331  LIPASE 23 23   No results for input(s): AMMONIA in the last 168 hours. Coagulation profile Recent Labs  Lab 01/14/20 0950  INR 1.1    Cardiac Enzymes: No results for input(s): CKTOTAL, CKMB, CKMBINDEX, TROPONINI in the last 168 hours. BNP: Invalid input(s): POCBNP CBG: Recent Labs  Lab 01/14/20 1747 01/14/20 1942 01/14/20 2332 01/15/20 0354 01/15/20 0800  GLUCAP 189* 173* 141* 111* 122*   D-Dimer No results for input(s): DDIMER in the last 72 hours. Hgb A1c Recent Labs    01/14/20 0952  HGBA1C 6.8*   Lipid Profile No results for input(s): CHOL, HDL, LDLCALC, TRIG, CHOLHDL, LDLDIRECT in the last 72 hours. Thyroid function studies No results for input(s): TSH, T4TOTAL, T3FREE, THYROIDAB in the last 72 hours.  Invalid input(s): FREET3 Anemia work up No results for input(s): VITAMINB12, FOLATE, FERRITIN,  TIBC, IRON, RETICCTPCT in the last 72 hours. Urinalysis     Component Value Date/Time   COLORURINE YELLOW 01/14/2020 0427   APPEARANCEUR CLEAR 01/14/2020 0427   LABSPEC 1.005 01/14/2020 0427   PHURINE 5.0 01/14/2020 0427   GLUCOSEU NEGATIVE 01/14/2020 0427   HGBUR NEGATIVE 01/14/2020 0427   BILIRUBINUR NEGATIVE 01/14/2020 0427   BILIRUBINUR neg 01/05/2020 1006   KETONESUR 5 (A) 01/14/2020 0427   PROTEINUR 30 (A) 01/14/2020 0427   UROBILINOGEN 0.2 01/05/2020 1006   UROBILINOGEN 0.2 11/20/2017 1049   NITRITE NEGATIVE 01/14/2020 0427   LEUKOCYTESUR NEGATIVE 01/14/2020 0427     Microbiology Recent Results (from the past 240 hour(s))  SARS Coronavirus 2 by RT PCR (hospital order, performed in [facility] hospital lab) Nasopharyngeal Nasopharyngeal Swab     Status: None   Collection Time: 01/14/20  3:23 AM   Specimen: Nasopharyngeal Swab  Result Value Ref Range Status   SARS Coronavirus 2 NEGATIVE NEGATIVE Final    Comment: (NOTE) SARS-CoV-2 target nucleic acids are NOT DETECTED. The SARS-CoV-2 RNA is generally detectable in upper and lower respiratory specimens during the acute phase of infection. The lowest concentration of SARS-CoV-2 viral copies this assay can detect is 250 copies / mL. A negative result does not preclude SARS-CoV-2 infection and should not be used as the sole basis for treatment or other patient management decisions.  A negative result may occur with improper specimen collection / handling, submission of specimen other than nasopharyngeal swab, presence of viral mutation(s) within the areas targeted by this assay, and inadequate number of viral copies (<250 copies / mL). A negative result must be combined with clinical observations, patient history, and epidemiological information. Fact Sheet for Patients:   StrictlyIdeas.no Fact Sheet for Healthcare Providers: BankingDealers.co.za This test is not yet approved or cleared  by the [place] FDA and has been  authorized for detection and/or diagnosis of SARS-CoV-2 by FDA under an Emergency Use Authorization (EUA).  This EUA will remain in effect (meaning this test can be used) for the duration of the COVID-19 declaration under Section 564(b)(1) of the Act, 21 U.S.C. section 360bbb-3(b)(1), unless the authorization is terminated or revoked sooner. Performed at [facility], [place] [address] [place], [place] [address]        Inpatient Medications:   Scheduled Meds: . Chlorhexidine Gluconate Cloth  6 each Topical Daily  . enoxaparin (LOVENOX) injection  40 mg Subcutaneous Q24H  . insulin aspart  0-15 Units Subcutaneous Q4H  . mouth rinse  15 mL Mouth Rinse BID  . pantoprazole (PROTONIX) IV  40 mg Intravenous QHS   Continuous Infusions: . acetaminophen Stopped (01/15/20 0724)  . chlorproMAZINE (THORAZINE) IV    . lactated ringers    . lactated ringers 125 mL/hr at 01/15/20 1100  . methocarbamol (ROBAXIN) IV    . piperacillin-tazobactam (ZOSYN)  IV 12.5 mL/hr at 01/15/20 1100     Radiological Exams on Admission: CT ABDOMEN PELVIS WO CONTRAST  Result Date: 01/14/2020 CLINICAL DATA:  Sigmoid volvulus, ongoing pain after sigmoidoscopy and attempted decompression. EXAM: CT ABDOMEN AND PELVIS WITHOUT CONTRAST TECHNIQUE: Multidetector CT imaging of the abdomen and pelvis was performed following the standard protocol without IV contrast. COMPARISON:  01/14/2020 at 3:02 a.m. FINDINGS: Lower chest: Increased volume loss and ground-glass opacity anteriorly in the left lower lobe on image 1/6. Subsegmental atelectasis in the right middle lobe and right lower lobe. Descending thoracic aortic atherosclerosis. Coronary atherosclerosis. Hepatobiliary: Mildly prominent gallbladder diameter, otherwise unremarkable. Pancreas: Unremarkable  Spleen: Unremarkable Adrenals/Urinary Tract: Low-density fullness of the left adrenal gland. Excreted contrast in the collecting systems, ureters, and  urinary bladder. There is some faint residual stranding in the kidneys, correlate with creatinine in assessing for any low-grade renal dysfunction. Stomach/Bowel: Similar appearance of sigmoid volvulus. Adjacent mild mesenteric/omental stranding. The volvulus is not reduced. Colon diameter in the affected segment 9 0.5 cm. No pneumatosis or extraluminal gas. Vascular/Lymphatic: Aortoiliac atherosclerotic vascular disease. Reproductive: Uterus absent. Adnexa unremarkable. Other: Mild pelvic ascites, similar to prior. The trace ascites along the right paracolic gutter is mildly increased. Musculoskeletal: Degenerative hip arthropathy bilaterally. Multilevel lumbar degenerative facet arthropathy. IMPRESSION: 1. Similar appearance of sigmoid volvulus. No pneumatosis or extraluminal gas. 2. Mild pelvic ascites, similar to prior. 3. Increased volume loss and ground-glass opacity anteriorly in the left lower lobe, potentially from atelectasis or less likely early pneumonia. 4. Faint residual stranding in the kidneys, correlate with creatinine in assessing for any low-grade renal dysfunction. 5. Aortic atherosclerosis. Coronary atherosclerosis. Aortic Atherosclerosis (ICD10-I70.0). Electronically Signed   By: Van Clines M.D.   On: 01/14/2020 10:04   CT Abdomen Pelvis W Contrast  Result Date: 01/14/2020 CLINICAL DATA:  Left lower quadrant pain radiating to midline EXAM: CT ABDOMEN AND PELVIS WITH CONTRAST TECHNIQUE: Multidetector CT imaging of the abdomen and pelvis was performed using the standard protocol following bolus administration of intravenous contrast. CONTRAST:  124mL OMNIPAQUE IOHEXOL 300 MG/ML  SOLN COMPARISON:  01/09/2020 FINDINGS: Lower chest: Ground-glass opacity within the lingula could reflect early pneumonia or hypoventilatory change. Hepatobiliary: No focal liver abnormality is seen. No gallstones, gallbladder wall thickening, or biliary dilatation. Pancreas: Unremarkable. No pancreatic ductal  dilatation or surrounding inflammatory changes. Spleen: Normal in size without focal abnormality. Adrenals/Urinary Tract: Adrenal glands are unremarkable. Kidneys are normal, without renal calculi, focal lesion, or hydronephrosis. Bladder is unremarkable. Stomach/Bowel: There is a large dilated loop of bowel within the lower central abdomen, measuring up to 9 cm in diameter. The appearance and location is consistent with sigmoid volvulus. There is no wall edema or pneumatosis. No bowel obstruction. Vascular/Lymphatic: Aortic atherosclerosis. No enlarged abdominal or pelvic lymph nodes. Reproductive: Status post hysterectomy. No adnexal masses. Other: Trace ascites within the lower pelvis. No free intra-abdominal gas. Musculoskeletal: There are no acute or destructive bony lesions. Reconstructed images demonstrate no additional findings. IMPRESSION: 1. Sigmoid volvulus, with distended section of sigmoid colon measuring up to 9 cm in diameter. No evidence of wall thickening or pneumatosis. 2. Trace free fluid within the pelvis. 3.  Aortic Atherosclerosis (ICD10-I70.0). 4. Lingular ground-glass opacity which could reflect hypoventilatory change or early pneumonia. These results were called by telephone at the time of interpretation on 01/14/2020 at 3:23 am to provider Margaret Kelly , who verbally acknowledged these results. Electronically Signed   By: Randa Ngo M.D.   On: 01/14/2020 03:21   DG Chest Port 1 View  Result Date: 01/14/2020 CLINICAL DATA:  Abdominal pain EXAM: PORTABLE CHEST 1 VIEW COMPARISON:  10/05/2013 FINDINGS: Single frontal view of the chest demonstrates an unremarkable cardiac silhouette. The ground-glass density within the lingula seen on corresponding CT is not visible by radiograph. No airspace disease, effusion, or pneumothorax on this exam. No acute bony abnormalities. IMPRESSION: 1. Stable chest, no acute process. Ground-glass density on abdominal CT likely reflects hypoventilatory  change. Electronically Signed   By: Randa Ngo M.D.   On: 01/14/2020 03:51    Impression/Recommendations Principal Problem:   Cecal volvulus s/p ileocolectomy 01/14/2020 Active Problems:   Essential hypertension  Hyperlipidemia LDL goal <100   History of hepatitis C   Pain of left hip joint   Peripheral arterial disease ([place])   Long term current use of anticoagulant therapy   Nerve damage   GERD (gastroesophageal reflux disease)   Blood transfusion without reported diagnosis   Anxiety   Bowel obstruction ([facility])   Sigmoid volvulus ([facility])  Essential HTN -Metoprolol IV 5 mg PRN -Hold all other BP medication at this time patient just s/p abdominal surgery  PAD -All home meds on hold secondary to abdominal surgery  HLD -All home meds on hold secondary to abdominal surgery  Pain regimen -Increase Dilaudid IV 1 to 2 mg PRN  Anxiety -Ativan 1 to 2 mg PRN   Tobacco abuse -Nicotine patch PRN  Sigmoid volvulus/Bowel obstruction -5/13 s/p ex lap see above  Refractory nausea -Zofran PRN -Thorazine PRN if Zofran fails to alleviate nausea      Thank you for this consultation.  Our [facility] hospitalist team will follow the patient with you.   Time Spent: 35 minutes  Livia Tarr, Geraldo Docker M.D. [facility] 01/15/2020, 11:11 AM  [id number]

## 2020-01-15 NOTE — Progress Notes (Signed)
Margaret Kelly [id number] 06-25-1951  CARE TEAM:  PCP: Dorena Dew, FNP  Outpatient Care Team: Patient Care Team: Dorena Dew, FNP as PCP - General (Family Medicine) [name], MD as PCP - Cardiology (Cardiology) [name], MD as Consulting Physician (Gastroenterology)  Inpatient Treatment Team: Treatment Team: Attending Provider: Edison Pace, Md, MD; Consulting Physician: Edison Pace, Md, MD; [place] Nurse: Tenna Child, RN; Rounding Team: Suzan Garibaldi, MD; Registered Nurse: Maryann Alar, RN; Case Manager: Frann Rider, RN; Utilization Review: Micah Noel, RN; Registered Nurse: Charleen Kirks, RN   Problem List:   Principal Problem:   Cecal volvulus s/p ileocolectomy 01/14/2020 Active Problems:   Essential hypertension   Hyperlipidemia LDL goal <100   History of hepatitis C   Pain of left hip joint   Peripheral arterial disease ([place])   Long term current use of anticoagulant therapy   Nerve damage   GERD (gastroesophageal reflux disease)   Blood transfusion without reported diagnosis   Anxiety   Bowel obstruction ([place])   Sigmoid volvulus ([place])   1 Day Post-Op  01/14/2020  PRE-OPERATIVE DIAGNOSIS:  SIGMOID VOLVULUS  POST-OPERATIVE DIAGNOSIS:  CECAL VOLVULUS with ischemia   PROCEDURE:   EXPLORATORY LAPAROTOMY ILEOCOLECTOMY TAP BLOCK - BILATERAL  SURGEON:  [name], MD    Assessment  Stabilizing  [facility] Stay = 1 days)  Plan:  Transfer to floor.  IV antibiotics x5 days with evidence of PE tubes.  Ischemia gangrene phlegmon   IV fluids.  Pain control.  Start to mobilize.  Enoxaparin for now.  If hemoglobin stable can more aggressively reanticoagulate if medicine feels.  Hypertension control.  Hold off on any parenteral IV nutrition unless has ileus beyond postop day 4.  Wound VAC changed and seems to be working well.  If clogs again with blood/oozing, can switch to packing twice daily over the weekend and then daily  starting next week.  I updated the patient's status to the patient and nurse.  Recommendations were made.  Questions were answered.  They expressed understanding & appreciation.  -VTE prophylaxis- SCDs, etc -mobilize as tolerated to help recovery  25 minutes spent in review, evaluation, examination, counseling, and coordination of care.  More than 50% of that time was spent in counseling.  01/15/2020    Subjective: (Chief complaint)  Wound VAC sponge chronic pain with old blood.  Replaced and seems to working better.  Nursing at bedside.  Patient's pain under better control but still rather sore  Objective:  Vital signs:  Vitals:   01/15/20 0240 01/15/20 0400 01/15/20 0600 01/15/20 0800  BP: 108/64 (!) 113/51 (!) 128/56 (!) 131/58  Pulse: 73 79 84 81  Resp: 12 11 20 17   Temp:  (!) 97.5 F (36.4 C)  98.4 F (36.9 C)  TempSrc:  Axillary  Oral  SpO2: 98% 97% 98% 96%  Weight:      Height:           Intake/Output   Yesterday:  05/13 0701 - 05/14 0700 In: [id number] [I.V.:6143.7; IV Piggyback:524.4] Out: 2075 [Urine:825; Emesis/NG output:600; Drains:200; Blood:50] This shift:  Total I/O In: 426.9 [I.V.:364.1; IV Piggyback:62.8] Out: 100 [Urine:100]  Bowel function:  Flatus: No  BM:  No  Drain: (No drain)   Physical Exam:  General: Pt awake/alert in no acute distress Eyes: PERRL, normal EOM.  Sclera clear.  No icterus Neuro: CN II-XII intact w/o focal sensory/motor deficits. Lymph: No head/neck/groin lymphadenopathy Psych:  No delerium/psychosis/paranoia.  Oriented x 4 HENT: Normocephalic, Mucus membranes moist.  No thrush Neck: Supple, No tracheal deviation.  No obvious thyromegaly Chest: No pain to chest wall compression.  Good respiratory excursion.  No audible wheezing CV:  Pulses intact.  Regular rhythm.  No major extremity edema MS: Normal AROM mjr joints.  No obvious deformity  Abdomen: Somewhat firm.  Moderately distended.  Mildly tender at  incisions only.  No evidence of peritonitis.  No incarcerated hernias.  Ext:  No deformity.  No mjr edema.  No cyanosis Skin: No petechiae / purpurea.  No major sores.  Warm and dry    Results:   Cultures: Recent Results (from the past 720 hour(s))  SARS Coronavirus 2 by RT PCR (hospital order, performed in [facility] hospital lab) Nasopharyngeal Nasopharyngeal Swab     Status: None   Collection Time: 01/14/20  3:23 AM   Specimen: Nasopharyngeal Swab  Result Value Ref Range Status   SARS Coronavirus 2 NEGATIVE NEGATIVE Final    Comment: (NOTE) SARS-CoV-2 target nucleic acids are NOT DETECTED. The SARS-CoV-2 RNA is generally detectable in upper and lower respiratory specimens during the acute phase of infection. The lowest concentration of SARS-CoV-2 viral copies this assay can detect is 250 copies / mL. A negative result does not preclude SARS-CoV-2 infection and should not be used as the sole basis for treatment or other patient management decisions.  A negative result may occur with improper specimen collection / handling, submission of specimen other than nasopharyngeal swab, presence of viral mutation(s) within the areas targeted by this assay, and inadequate number of viral copies (<250 copies / mL). A negative result must be combined with clinical observations, patient history, and epidemiological information. Fact Sheet for Patients:   StrictlyIdeas.no Fact Sheet for Healthcare Providers: BankingDealers.co.za This test is not yet approved or cleared  by the [place] FDA and has been authorized for detection and/or diagnosis of SARS-CoV-2 by FDA under an Emergency Use Authorization (EUA).  This EUA will remain in effect (meaning this test can be used) for the duration of the COVID-19 declaration under Section 564(b)(1) of the Act, 21 U.S.C. section 360bbb-3(b)(1), unless the authorization is terminated or revoked  sooner. Performed at [facility], [place] [address], [place], [place] [address]     Labs: Results for orders placed or performed during the hospital encounter of 01/13/20 (from the past 48 hour(s))  Comprehensive metabolic panel     Status: Abnormal   Collection Time: 01/13/20 11:31 PM  Result Value Ref Range   Sodium 134 (L) 135 - 145 mmol/L   Potassium 3.7 3.5 - 5.1 mmol/L   Chloride 94 (L) 98 - 111 mmol/L   CO2 25 22 - 32 mmol/L   Glucose, Bld 247 (H) 70 - 99 mg/dL    Comment: Glucose reference range applies only to samples taken after fasting for at least 8 hours.   BUN 25 (H) 8 - 23 mg/dL   Creatinine, Ser 1.04 (H) 0.44 - 1.00 mg/dL   Calcium 9.8 8.9 - 10.3 mg/dL   Total Protein 7.8 6.5 - 8.1 g/dL   Albumin 4.3 3.5 - 5.0 g/dL   AST 24 15 - 41 U/L   ALT 19 0 - 44 U/L   Alkaline Phosphatase 64 38 - 126 U/L   Total Bilirubin 1.4 (H) 0.3 - 1.2 mg/dL   GFR calc non Af Amer 56 (L) >60 mL/min   GFR calc Af Amer >60 >60 mL/min   Anion gap  15 5 - 15    Comment: Performed at [facility], [place] [address] [place], [place] [address]  Lipase, blood     Status: None   Collection Time: 01/13/20 11:31 PM  Result Value Ref Range   Lipase 23 11 - 51 U/L    Comment: Performed at [facility], [place] [address] [place], [place] [address]  CBC with Differential     Status: Abnormal   Collection Time: 01/13/20 11:31 PM  Result Value Ref Range   WBC 24.8 (H) 4.0 - 10.5 K/uL   RBC 4.72 3.87 - 5.11 MIL/uL   Hemoglobin 15.6 (H) 12.0 - 15.0 g/dL   HCT 44.4 36.0 - 46.0 %   MCV 94.1 80.0 - 100.0 fL   MCH 33.1 26.0 - 34.0 pg   MCHC 35.1 30.0 - 36.0 g/dL   RDW 11.9 11.5 - 15.5 %   Platelets 355 150 - 400 K/uL   nRBC 0.0 0.0 - 0.2 %   Neutrophils Relative % 82 %   Neutro Abs 20.1 (H) 1.7 - 7.7 K/uL   Lymphocytes Relative 7 %   Lymphs Abs 1.7 0.7 - 4.0 K/uL   Monocytes Relative 11 %   Monocytes Absolute 2.8 (H) 0.1 - 1.0 K/uL    Eosinophils Relative 0 %   Eosinophils Absolute 0.0 0.0 - 0.5 K/uL   Basophils Relative 0 %   Basophils Absolute 0.1 0.0 - 0.1 K/uL   Immature Granulocytes 0 %   Abs Immature Granulocytes 0.11 (H) 0.00 - 0.07 K/uL    Comment: Performed at [facility], [place] [address], [place], [place] [address]  Lactic acid, plasma     Status: None   Collection Time: 01/14/20 12:57 AM  Result Value Ref Range   Lactic Acid, Venous 1.6 0.5 - 1.9 mmol/L    Comment: Performed at [facility], [place] [address] [place], [place] [address]  SARS Coronavirus 2 by RT PCR (hospital order, performed in [facility] hospital lab) Nasopharyngeal Nasopharyngeal Swab     Status: None   Collection Time: 01/14/20  3:23 AM   Specimen: Nasopharyngeal Swab  Result Value Ref Range   SARS Coronavirus 2 NEGATIVE NEGATIVE    Comment: (NOTE) SARS-CoV-2 target nucleic acids are NOT DETECTED. The SARS-CoV-2 RNA is generally detectable in upper and lower respiratory specimens during the acute phase of infection. The lowest concentration of SARS-CoV-2 viral copies this assay can detect is 250 copies / mL. A negative result does not preclude SARS-CoV-2 infection and should not be used as the sole basis for treatment or other patient management decisions.  A negative result may occur with improper specimen collection / handling, submission of specimen other than nasopharyngeal swab, presence of viral mutation(s) within the areas targeted by this assay, and inadequate number of viral copies (<250 copies / mL). A negative result must be combined with clinical observations, patient history, and epidemiological information. Fact Sheet for Patients:   StrictlyIdeas.no Fact Sheet for Healthcare Providers: BankingDealers.co.za This test is not yet approved or cleared  by the [place] FDA and has been authorized for detection and/or diagnosis of  SARS-CoV-2 by FDA under an Emergency Use Authorization (EUA).  This EUA will remain in effect (meaning this test can be used) for the duration of the COVID-19 declaration under Section 564(b)(1) of the Act, 21 U.S.C. section 360bbb-3(b)(1), unless the authorization is terminated or revoked sooner. Performed at [facility], [place] [address]  [place], [place] [address]   Urinalysis, Routine w reflex microscopic     Status: Abnormal   Collection Time: 01/14/20  4:27 AM  Result Value Ref Range   Color, Urine YELLOW YELLOW   APPearance CLEAR CLEAR   Specific Gravity, Urine 1.005 1.005 - 1.030   pH 5.0 5.0 - 8.0   Glucose, UA NEGATIVE NEGATIVE mg/dL   Hgb urine dipstick NEGATIVE NEGATIVE   Bilirubin Urine NEGATIVE NEGATIVE   Ketones, ur 5 (A) NEGATIVE mg/dL   Protein, ur 30 (A) NEGATIVE mg/dL   Nitrite NEGATIVE NEGATIVE   Leukocytes,Ua NEGATIVE NEGATIVE   RBC / HPF 0-5 0 - 5 RBC/hpf   WBC, UA 0-5 0 - 5 WBC/hpf   Bacteria, UA NONE SEEN NONE SEEN   Squamous Epithelial / LPF 0-5 0 - 5   Mucus PRESENT     Comment: Performed at [facility], [place] [address], [place], [place] [address]  ABO/Rh     Status: None   Collection Time: 01/14/20  9:42 AM  Result Value Ref Range   ABO/RH(D)      A POS Performed at [facility], [place] [address], [place], [place] [address]   CBC     Status: Abnormal   Collection Time: 01/14/20  9:50 AM  Result Value Ref Range   WBC 26.8 (H) 4.0 - 10.5 K/uL   RBC 4.34 3.87 - 5.11 MIL/uL   Hemoglobin 14.4 12.0 - 15.0 g/dL   HCT 41.3 36.0 - 46.0 %   MCV 95.2 80.0 - 100.0 fL   MCH 33.2 26.0 - 34.0 pg   MCHC 34.9 30.0 - 36.0 g/dL   RDW 12.0 11.5 - 15.5 %   Platelets 306 150 - 400 K/uL   nRBC 0.0 0.0 - 0.2 %    Comment: Performed at [facility], [place] [address] [place], [place] [address]  Comprehensive metabolic panel     Status: Abnormal   Collection Time: 01/14/20  9:50 AM  Result  Value Ref Range   Sodium 132 (L) 135 - 145 mmol/L   Potassium 3.6 3.5 - 5.1 mmol/L   Chloride 95 (L) 98 - 111 mmol/L   CO2 24 22 - 32 mmol/L   Glucose, Bld 233 (H) 70 - 99 mg/dL    Comment: Glucose reference range applies only to samples taken after fasting for at least 8 hours.   BUN 21 8 - 23 mg/dL   Creatinine, Ser 0.83 0.44 - 1.00 mg/dL   Calcium 8.7 (L) 8.9 - 10.3 mg/dL   Total Protein 6.6 6.5 - 8.1 g/dL   Albumin 3.2 (L) 3.5 - 5.0 g/dL   AST 19 15 - 41 U/L   ALT 17 0 - 44 U/L   Alkaline Phosphatase 54 38 - 126 U/L   Total Bilirubin 1.0 0.3 - 1.2 mg/dL   GFR calc non Af Amer >60 >60 mL/min   GFR calc Af Amer >60 >60 mL/min   Anion gap 13 5 - 15    Comment: Performed at [facility], [place] [address], [place], [place] [address]  Protime-INR     Status: None   Collection Time: 01/14/20  9:50 AM  Result Value Ref Range   Prothrombin Time 13.9 11.4 - 15.2 seconds   INR 1.1 0.8 - 1.2    Comment: (NOTE) INR goal varies based on device and disease states. Performed at [facility], [place] [address] [place], [place] [address]  Hemoglobin A1c     Status: Abnormal   Collection Time: 01/14/20  9:52 AM  Result Value Ref Range   Hgb A1c MFr Bld 6.8 (H) 4.8 - 5.6 %    Comment: (NOTE) Pre diabetes:          5.7%-6.4% Diabetes:              >6.4% Glycemic control for   <7.0% adults with diabetes    Mean Plasma Glucose 148.46 mg/dL    Comment: Performed at [place] [address], [place], [place] [address]  APTT     Status: None   Collection Time: 01/14/20  9:52 AM  Result Value Ref Range   aPTT 25 24 - 36 seconds    Comment: Performed at [facility], [place] [address] [place], [place] [address]  Type and screen [place]     Status: None   Collection Time: 01/14/20 10:05 AM  Result Value Ref Range   ABO/RH(D) A POS    Antibody Screen NEG    Sample Expiration      01/17/2020,2359 Performed  at [facility], [place] [address], [place], [place] [address]   Surgical pathology     Status: None   Collection Time: 01/14/20 12:12 PM  Result Value Ref Range   SURGICAL PATHOLOGY      SURGICAL PATHOLOGY CASE: [id number] PATIENT: Elvina Sidle Surgical Pathology Report     Clinical History: Ruptured volvulus ([facility])   FINAL MICROSCOPIC DIAGNOSIS:  A. COLON, RIGHT, RESECTION: -  Segment of colon with necrosis -  Four benign lymph nodes (0/4) -  Margins viable -  No malignancy identified   Elzabeth Mcquerry DESCRIPTION:  Received fresh is a 20 cm length segment of cecum/right colon, with 12 cm of attached terminal ileum.  Appendix is not identified.  The cecum/right colon is up to 10 cm in diameter and filled with brown fluid and soft fecal material.  The terminal ileum is kinked 5 cm proximal to the ileocecal valve, with a band of adhesions around this kinked area. On opening, this area has a stenotic lumen less than 1 cm in diameter. The right colon is also slightly kinked 7 cm from the distal margin, and this area also has diffuse adhesions.  Remaining cecum/right colon has serosa varying from pink-red to dark green-red black with scattered adhes ions.  The mucosa of the terminal ileum is tan-pink to hyperemic, smooth, soft, with prominent edematous intestinal folds.  Mucosa of the right colon is predominantly yellow-green to dark red-black, flattened with scattered granularity.  There are scattered areas of tan-pink smooth, soft mucosa with widely spaced intestinal folds.  The wall of the right colon averages 0.1 cm thick with no obvious areas of perforation.  There are no mass lesions.  Margins of resection are grossly viable. Block summary: Block 1 = proximal margin Block 2 = distal margin Block 3 = sections at stenotic area of terminal ileum, including outer adhesions Block 4 = right colon Block 5 = sampling of 4 possible lymph nodes  SW  01/14/2020   Final Diagnosis performed by Thressa Sheller, MD.   Electronically signed 01/15/2020 Technical component performed at [facility], [place] [address], [place], [place] [address].  Professional component performed at [facility]. [place], [place] [address] [place], [place] [address].  Immunohistochemistry Technical component (if applicable) was performed at [facility]. [address], [place], [place]  [place] (if applicable): Some of these immunohistochemical stains may have been developed and the performance characteristics determine by [facility]. Some may not have been cleared or approved by the U.S. Food and Drug Administration. The FDA has determined that such clearance or approval is not necessary. This test is used for clinical purposes. It should not be regarded as investigational or for research. This laboratory is certified under the [place] (CLIA-88) as qualified to perform high complexity clinical laboratory testing.  The controls stained appropriately.   HIV Antibody (routine testing w rflx)     Status: None   Collection Time: 01/14/20 12:51 PM  Result Value Ref Range   HIV Screen 4th Generation wRfx Non Reactive Non Reactive    Comment: Performed at [facility], [place] [address] [place], [place] [address]  Glucose, capillary     Status: Abnormal   Collection Time: 01/14/20  5:47 PM  Result Value Ref Range   Glucose-Capillary 189 (H) 70 - 99 mg/dL    Comment: Glucose reference range applies only to samples taken after fasting for at least 8 hours.  Glucose, capillary     Status: Abnormal   Collection Time: 01/14/20  7:42 PM  Result Value Ref Range   Glucose-Capillary 173 (H) 70 - 99 mg/dL    Comment: Glucose reference range applies only to samples taken after fasting for at least 8 hours.  Glucose, capillary      Status: Abnormal   Collection Time: 01/14/20 11:32 PM  Result Value Ref Range   Glucose-Capillary 141 (H) 70 - 99 mg/dL    Comment: Glucose reference range applies only to samples taken after fasting for at least 8 hours.  Glucose, capillary     Status: Abnormal   Collection Time: 01/15/20  3:54 AM  Result Value Ref Range   Glucose-Capillary 111 (H) 70 - 99 mg/dL    Comment: Glucose reference range applies only to samples taken after fasting for at least 8 hours.  Comprehensive metabolic panel     Status: Abnormal   Collection Time: 01/15/20  8:00 AM  Result Value Ref Range   Sodium 137 135 - 145 mmol/L   Potassium 3.7 3.5 - 5.1 mmol/L   Chloride 100 98 - 111 mmol/L   CO2 28 22 - 32 mmol/L   Glucose, Bld 135 (H) 70 - 99 mg/dL    Comment: Glucose reference range applies only to samples taken after fasting for at least 8 hours.   BUN 15 8 - 23 mg/dL   Creatinine, Ser 0.51 0.44 - 1.00 mg/dL   Calcium 7.8 (L) 8.9 - 10.3 mg/dL   Total Protein 5.0 (L) 6.5 - 8.1 g/dL   Albumin 2.4 (L) 3.5 - 5.0 g/dL   AST 20 15 - 41 U/L   ALT 14 0 - 44 U/L   Alkaline Phosphatase 35 (L) 38 - 126 U/L   Total Bilirubin 1.1 0.3 - 1.2 mg/dL   GFR calc non Af Amer >60 >60 mL/min   GFR calc Af Amer >60 >60 mL/min   Anion gap 9 5 - 15    Comment: Performed at [facility], [place] [address], [place], [place] [address]  Magnesium     Status: None   Collection Time: 01/15/20  8:00 AM  Result Value Ref Range   Magnesium 1.7 1.7 - 2.4 mg/dL    Comment: Performed at [facility], [place]  [address] [place], [place] [address]  Phosphorus     Status: Abnormal   Collection Time: 01/15/20  8:00 AM  Result Value Ref Range   Phosphorus 2.4 (L) 2.5 - 4.6 mg/dL    Comment: Performed at [facility], [place] [address], [place], [place] [address]  CBC with Differential/Platelet     Status: Abnormal   Collection Time: 01/15/20  8:00 AM  Result Value Ref Range   WBC 15.8  (H) 4.0 - 10.5 K/uL   RBC 3.21 (L) 3.87 - 5.11 MIL/uL   Hemoglobin 10.7 (L) 12.0 - 15.0 g/dL    Comment: REPEATED TO VERIFY DELTA CHECK NOTED    HCT 31.1 (L) 36.0 - 46.0 %   MCV 96.9 80.0 - 100.0 fL   MCH 33.3 26.0 - 34.0 pg   MCHC 34.4 30.0 - 36.0 g/dL   RDW 12.4 11.5 - 15.5 %   Platelets 237 150 - 400 K/uL   nRBC 0.0 0.0 - 0.2 %   Neutrophils Relative % 70 %   Neutro Abs 13.1 (H) 1.7 - 7.7 K/uL   Band Neutrophils 13 %   Lymphocytes Relative 8 %   Lymphs Abs 1.3 0.7 - 4.0 K/uL   Monocytes Relative 4 %   Monocytes Absolute 0.6 0.1 - 1.0 K/uL   Eosinophils Relative 0 %   Eosinophils Absolute 0.0 0.0 - 0.5 K/uL   Basophils Relative 0 %   Basophils Absolute 0.0 0.0 - 0.1 K/uL   WBC Morphology      MODERATE LEFT SHIFT (>5% METAS AND MYELOS,OCC PRO NOTED)    Comment: TOXIC GRANULATION   Metamyelocytes Relative 2 %   Myelocytes 3 %   Abs Immature Granulocytes 0.80 (H) 0.00 - 0.07 K/uL    Comment: Performed at [facility], [place] [address] [place], [place] [address]  Glucose, capillary     Status: Abnormal   Collection Time: 01/15/20  8:00 AM  Result Value Ref Range   Glucose-Capillary 122 (H) 70 - 99 mg/dL    Comment: Glucose reference range applies only to samples taken after fasting for at least 8 hours.    Imaging / Studies: CT ABDOMEN PELVIS WO CONTRAST  Result Date: 01/14/2020 CLINICAL DATA:  Sigmoid volvulus, ongoing pain after sigmoidoscopy and attempted decompression. EXAM: CT ABDOMEN AND PELVIS WITHOUT CONTRAST TECHNIQUE: Multidetector CT imaging of the abdomen and pelvis was performed following the standard protocol without IV contrast. COMPARISON:  01/14/2020 at 3:02 a.m. FINDINGS: Lower chest: Increased volume loss and ground-glass opacity anteriorly in the left lower lobe on image 1/6. Subsegmental atelectasis in the right middle lobe and right lower lobe. Descending thoracic aortic atherosclerosis. Coronary atherosclerosis. Hepatobiliary: Mildly  prominent gallbladder diameter, otherwise unremarkable. Pancreas: Unremarkable Spleen: Unremarkable Adrenals/Urinary Tract: Low-density fullness of the left adrenal gland. Excreted contrast in the collecting systems, ureters, and urinary bladder. There is some faint residual stranding in the kidneys, correlate with creatinine in assessing for any low-grade renal dysfunction. Stomach/Bowel: Similar appearance of sigmoid volvulus. Adjacent mild mesenteric/omental stranding. The volvulus is not reduced. Colon diameter in the affected segment 9 0.5 cm. No pneumatosis or extraluminal gas. Vascular/Lymphatic: Aortoiliac atherosclerotic vascular disease. Reproductive: Uterus absent. Adnexa unremarkable. Other: Mild pelvic ascites, similar to prior. The trace ascites along the right paracolic gutter is mildly increased. Musculoskeletal: Degenerative hip arthropathy bilaterally. Multilevel lumbar degenerative facet arthropathy. IMPRESSION: 1. Similar appearance of sigmoid volvulus. No pneumatosis or extraluminal gas. 2. Mild pelvic ascites, similar to prior. 3. Increased volume loss and  ground-glass opacity anteriorly in the left lower lobe, potentially from atelectasis or less likely early pneumonia. 4. Faint residual stranding in the kidneys, correlate with creatinine in assessing for any low-grade renal dysfunction. 5. Aortic atherosclerosis. Coronary atherosclerosis. Aortic Atherosclerosis (ICD10-I70.0). Electronically Signed   By: Van Clines M.D.   On: 01/14/2020 10:04   CT Abdomen Pelvis W Contrast  Result Date: 01/14/2020 CLINICAL DATA:  Left lower quadrant pain radiating to midline EXAM: CT ABDOMEN AND PELVIS WITH CONTRAST TECHNIQUE: Multidetector CT imaging of the abdomen and pelvis was performed using the standard protocol following bolus administration of intravenous contrast. CONTRAST:  142mL OMNIPAQUE IOHEXOL 300 MG/ML  SOLN COMPARISON:  01/09/2020 FINDINGS: Lower chest: Ground-glass opacity within  the lingula could reflect early pneumonia or hypoventilatory change. Hepatobiliary: No focal liver abnormality is seen. No gallstones, gallbladder wall thickening, or biliary dilatation. Pancreas: Unremarkable. No pancreatic ductal dilatation or surrounding inflammatory changes. Spleen: Normal in size without focal abnormality. Adrenals/Urinary Tract: Adrenal glands are unremarkable. Kidneys are normal, without renal calculi, focal lesion, or hydronephrosis. Bladder is unremarkable. Stomach/Bowel: There is a large dilated loop of bowel within the lower central abdomen, measuring up to 9 cm in diameter. The appearance and location is consistent with sigmoid volvulus. There is no wall edema or pneumatosis. No bowel obstruction. Vascular/Lymphatic: Aortic atherosclerosis. No enlarged abdominal or pelvic lymph nodes. Reproductive: Status post hysterectomy. No adnexal masses. Other: Trace ascites within the lower pelvis. No free intra-abdominal gas. Musculoskeletal: There are no acute or destructive bony lesions. Reconstructed images demonstrate no additional findings. IMPRESSION: 1. Sigmoid volvulus, with distended section of sigmoid colon measuring up to 9 cm in diameter. No evidence of wall thickening or pneumatosis. 2. Trace free fluid within the pelvis. 3.  Aortic Atherosclerosis (ICD10-I70.0). 4. Lingular ground-glass opacity which could reflect hypoventilatory change or early pneumonia. These results were called by telephone at the time of interpretation on 01/14/2020 at 3:23 am to provider MARGAUX VENTER , who verbally acknowledged these results. Electronically Signed   By: Randa Ngo M.D.   On: 01/14/2020 03:21   DG Chest Port 1 View  Result Date: 01/14/2020 CLINICAL DATA:  Abdominal pain EXAM: PORTABLE CHEST 1 VIEW COMPARISON:  10/05/2013 FINDINGS: Single frontal view of the chest demonstrates an unremarkable cardiac silhouette. The ground-glass density within the lingula seen on corresponding CT is not  visible by radiograph. No airspace disease, effusion, or pneumothorax on this exam. No acute bony abnormalities. IMPRESSION: 1. Stable chest, no acute process. Ground-glass density on abdominal CT likely reflects hypoventilatory change. Electronically Signed   By: Randa Ngo M.D.   On: 01/14/2020 03:51    Medications / Allergies: per chart  Antibiotics: Anti-infectives (From admission, onward)   Start     Dose/Rate Route Frequency Ordered Stop   01/14/20 1730  piperacillin-tazobactam (ZOSYN) IVPB 3.375 g     3.375 g 12.5 mL/hr over 240 Minutes Intravenous Every 8 hours 01/14/20 1723 01/19/20 1559   01/14/20 1001  sodium chloride 0.9 % with cefoTEtan (CEFOTAN) ADS Med    Note to Pharmacy: Jefm Miles   : cabinet override      01/14/20 1001 01/14/20 1059   01/14/20 0945  cefoTEtan (CEFOTAN) 2 g in sodium chloride 0.9 % 100 mL IVPB     2 g 200 mL/hr over 30 Minutes Intravenous On call to O.R. 01/14/20 0941 01/14/20 1040   01/14/20 0015  cefTRIAXone (ROCEPHIN) 2 g in sodium chloride 0.9 % 100 mL IVPB     2 g  200 mL/hr over 30 Minutes Intravenous  Once 01/14/20 0008 01/14/20 0158   01/14/20 0015  metroNIDAZOLE (FLAGYL) IVPB 500 mg     500 mg 100 mL/hr over 60 Minutes Intravenous  Once 01/14/20 0008 01/14/20 0243        Note: Portions of this report may have been transcribed using voice recognition software. Every effort was made to ensure accuracy; however, inadvertent computerized transcription errors may be present.   Any transcriptional errors that result from this process are unintentional.    [name], MD, FACS, MASCRS Gastrointestinal and Minimally Invasive Surgery  [facility] Surgery [address], [place], [place] [address] [phone] Fax [phone] Main/Paging  CONTACT INFORMATION: Weekday (9AM-5PM) concerns: Call [facility] main office at [phone] Weeknight (5PM-9AM) or Weekend/Holiday concerns: Check [email or web address] for General  Surgery [facility] coverage (Please, do not use SecureChat as it is not reliable communication to surgeons for patient care)      01/15/2020  9:46 AM

## 2020-01-15 NOTE — Progress Notes (Addendum)
Wound VAC continuing to alarm, old blood noted on dressing and in cannister. WOC RN came in to assess. Cannister and dressing changed by [place] RN. Dr. Johney Maine at bedside. Per MD, if cannister clogs again RN may apply a WTD dressing in place of Wound VAC. Continuing to monitor.

## 2020-01-15 NOTE — Consult Note (Signed)
[facility] [place] Nurse Consult Note: Reason for Consult: Vac dressing is reading blockage; previously surgical team planned for first post-op dressing change to be performed on Mon.  Dr Johney Maine at the bedside to assess wound and Vac dressing change performed to midline post-op full thickness abd wound. Previous Vac dressing was saturated with old bloody clots, as well as the Vac cannister and track pad. Wound is beefy red without active bleeding, small amt blood-tinged drainage.   15X5X5cm Periwound:intact skin surrounding Dressing procedure/placement/frequency: Pt tolerated dressing change with minimal amt discomfort.  Applied one piece black foam to 171mm cont suction. [place] team will plan to change again on Mon. Julien Girt MSN, RN, [place], [place], [place]

## 2020-01-16 LAB — CBC WITH DIFFERENTIAL/PLATELET
Abs Immature Granulocytes: 0.07 10*3/uL (ref 0.00–0.07)
Basophils Absolute: 0 10*3/uL (ref 0.0–0.1)
Basophils Relative: 0 %
Eosinophils Absolute: 0.1 10*3/uL (ref 0.0–0.5)
Eosinophils Relative: 1 %
HCT: 29.4 % — ABNORMAL LOW (ref 36.0–46.0)
Hemoglobin: 9.8 g/dL — ABNORMAL LOW (ref 12.0–15.0)
Immature Granulocytes: 1 %
Lymphocytes Relative: 10 %
Lymphs Abs: 1.3 10*3/uL (ref 0.7–4.0)
MCH: 32.9 pg (ref 26.0–34.0)
MCHC: 33.3 g/dL (ref 30.0–36.0)
MCV: 98.7 fL (ref 80.0–100.0)
Monocytes Absolute: 0.9 10*3/uL (ref 0.1–1.0)
Monocytes Relative: 7 %
Neutro Abs: 10.8 10*3/uL — ABNORMAL HIGH (ref 1.7–7.7)
Neutrophils Relative %: 81 %
Platelets: 258 10*3/uL (ref 150–400)
RBC: 2.98 MIL/uL — ABNORMAL LOW (ref 3.87–5.11)
RDW: 12.5 % (ref 11.5–15.5)
WBC: 13.2 10*3/uL — ABNORMAL HIGH (ref 4.0–10.5)
nRBC: 0 % (ref 0.0–0.2)

## 2020-01-16 LAB — CBC
HCT: 29.7 % — ABNORMAL LOW (ref 36.0–46.0)
Hemoglobin: 10 g/dL — ABNORMAL LOW (ref 12.0–15.0)
MCH: 32.7 pg (ref 26.0–34.0)
MCHC: 33.7 g/dL (ref 30.0–36.0)
MCV: 97.1 fL (ref 80.0–100.0)
Platelets: 288 10*3/uL (ref 150–400)
RBC: 3.06 MIL/uL — ABNORMAL LOW (ref 3.87–5.11)
RDW: 12.5 % (ref 11.5–15.5)
WBC: 14.8 10*3/uL — ABNORMAL HIGH (ref 4.0–10.5)
nRBC: 0 % (ref 0.0–0.2)

## 2020-01-16 LAB — COMPREHENSIVE METABOLIC PANEL
ALT: 17 U/L (ref 0–44)
AST: 24 U/L (ref 15–41)
Albumin: 2.6 g/dL — ABNORMAL LOW (ref 3.5–5.0)
Alkaline Phosphatase: 44 U/L (ref 38–126)
Anion gap: 6 (ref 5–15)
BUN: 14 mg/dL (ref 8–23)
CO2: 30 mmol/L (ref 22–32)
Calcium: 8.3 mg/dL — ABNORMAL LOW (ref 8.9–10.3)
Chloride: 100 mmol/L (ref 98–111)
Creatinine, Ser: 0.54 mg/dL (ref 0.44–1.00)
GFR calc Af Amer: >60 mL/min (ref >60)
GFR calc non Af Amer: >60 mL/min (ref >60)
Glucose, Bld: 109 mg/dL — ABNORMAL HIGH (ref 70–99)
Potassium: 3.5 mmol/L (ref 3.5–5.1)
Sodium: 136 mmol/L (ref 135–145)
Total Bilirubin: 1 mg/dL (ref 0.3–1.2)
Total Protein: 5.5 g/dL — ABNORMAL LOW (ref 6.5–8.1)

## 2020-01-16 LAB — GLUCOSE, CAPILLARY
Glucose-Capillary: 103 mg/dL — ABNORMAL HIGH (ref 70–99)
Glucose-Capillary: 105 mg/dL — ABNORMAL HIGH (ref 70–99)
Glucose-Capillary: 111 mg/dL — ABNORMAL HIGH (ref 70–99)
Glucose-Capillary: 97 mg/dL (ref 70–99)
Glucose-Capillary: 97 mg/dL (ref 70–99)
Glucose-Capillary: 98 mg/dL (ref 70–99)

## 2020-01-16 LAB — PHOSPHORUS: Phosphorus: 2.7 mg/dL (ref 2.5–4.6)

## 2020-01-16 LAB — MAGNESIUM: Magnesium: 2 mg/dL (ref 1.7–2.4)

## 2020-01-16 MED ORDER — DIPHENHYDRAMINE HCL 50 MG/ML IJ SOLN
12.5000 mg | Freq: Four times a day (QID) | INTRAMUSCULAR | Status: DC | PRN
  Administered 2020-01-17: 12.5 mg via INTRAVENOUS
  Filled 2020-01-16: qty 1

## 2020-01-16 MED ORDER — PHENOL 1.4 % MT LIQD
1.0000 | OROMUCOSAL | Status: DC | PRN
  Filled 2020-01-16: qty 177

## 2020-01-16 MED ORDER — IPRATROPIUM-ALBUTEROL 0.5-2.5 (3) MG/3ML IN SOLN
3.0000 mL | Freq: Four times a day (QID) | RESPIRATORY_TRACT | Status: DC
  Administered 2020-01-16 – 2020-01-17 (×2): 3 mL via RESPIRATORY_TRACT
  Filled 2020-01-16 (×3): qty 3

## 2020-01-16 MED ORDER — DM-GUAIFENESIN ER 30-600 MG PO TB12
1.0000 | ORAL_TABLET | Freq: Two times a day (BID) | ORAL | Status: DC
  Administered 2020-01-18 – 2020-01-22 (×9): 1 via ORAL
  Filled 2020-01-16 (×9): qty 1

## 2020-01-16 MED ORDER — METOPROLOL TARTRATE 5 MG/5ML IV SOLN
5.0000 mg | Freq: Four times a day (QID) | INTRAVENOUS | Status: DC
  Administered 2020-01-16 – 2020-01-20 (×16): 5 mg via INTRAVENOUS
  Filled 2020-01-16 (×16): qty 5

## 2020-01-16 MED ORDER — ONDANSETRON HCL 4 MG/2ML IJ SOLN: 4.0000 mg | Freq: Four times a day (QID) | INTRAMUSCULAR | Status: DC | PRN

## 2020-01-16 MED ORDER — HYDROMORPHONE 1 MG/ML IV SOLN
INTRAVENOUS | Status: DC
  Administered 2020-01-16: 30 mg via INTRAVENOUS
  Administered 2020-01-16 – 2020-01-17 (×3): 0.6 mg via INTRAVENOUS
  Administered 2020-01-17: 0.8 mg via INTRAVENOUS
  Filled 2020-01-16: qty 30

## 2020-01-16 MED ORDER — DIPHENHYDRAMINE HCL 12.5 MG/5ML PO ELIX: 12.5000 mg | ORAL_SOLUTION | Freq: Four times a day (QID) | ORAL | Status: DC | PRN

## 2020-01-16 MED ORDER — SODIUM CHLORIDE 0.9% FLUSH: 9.0000 mL | INTRAVENOUS | Status: DC | PRN

## 2020-01-16 MED ORDER — FUROSEMIDE 10 MG/ML IJ SOLN
40.0000 mg | Freq: Once | INTRAMUSCULAR | Status: AC
  Administered 2020-01-16: 40 mg via INTRAVENOUS
  Filled 2020-01-16: qty 4

## 2020-01-16 MED ORDER — NALOXONE HCL 0.4 MG/ML IJ SOLN: 0.4000 mg | INTRAMUSCULAR | Status: DC | PRN

## 2020-01-16 NOTE — Progress Notes (Signed)
RN noticed the pt's NGT was suctioning blood and the pt had complaints of increased pain in her abdomen of 6/10. RN paged MD and was informed to cut off suction and to page on-call surgeon. On-call surgeon informed me to not change the wound vac dressing and to place an order for a STAT CBC. Will continue to monitor closely.

## 2020-01-16 NOTE — Progress Notes (Signed)
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 Active Problems:   Essential hypertension   Hyperlipidemia LDL goal <100   History of hepatitis C   Pain of left hip joint   Peripheral arterial disease ([place])   Long term current use of anticoagulant therapy   Nerve damage   GERD (gastroesophageal reflux disease)   Blood transfusion without reported diagnosis   Anxiety   Bowel obstruction ([place])   Sigmoid volvulus ([place])   2 Days Post-Op  01/14/2020  PRE-OPERATIVE DIAGNOSIS:  SIGMOID VOLVULUS  POST-OPERATIVE DIAGNOSIS:  CECAL VOLVULUS with ischemia   PROCEDURE:   EXPLORATORY LAPAROTOMY ILEOCOLECTOMY TAP BLOCK - BILATERAL  SURGEON:  [name], MD    Assessment  Stabilizing  [facility] Central Stay = 2 days)  Plan:  IV antibiotics x5 days with evidence of PE tubes.  Ischemia gangrene phlegmon   IV fluids.  Continue NGT/NPO for ileus.    Continue foley for marginal UOP.    Pain control.  PCA.  Mobilize as tolerated.  Enoxaparin ppx for now.  Would not do full anticoagulation yet as wound still a little bloody.    Hypertension control.   Wound VAC changed and seems to be working well.  If clogs again with blood/oozing, can switch to packing twice daily over the weekend and then daily starting next week.  I updated the patient's status to the patient and nurse.  Recommendations were made.  Questions were answered.  They expressed understanding &  appreciation.  -VTE prophylaxis- SCDs, etc -mobilize as tolerated to help recovery   01/16/2020    Subjective: (Chief complaint)  Patient having quite a bit of pain in throat and abdomen.  Having burning abdominal pain.  Needing IV pain meds more than written.  Lozenges, spray, ice chips and swabs have not relieved throat pain.    Objective:  Vital signs:  Vitals:   01/15/20 1939 01/15/20 2312 01/16/20 0310 01/16/20 0635  BP: 138/66 (!) 122/53 120/87 137/75  Pulse: 98 89 88 91  Resp: 20 19 20 20   Temp: 98 F (36.7 C) 98.4 F (36.9 C) 97.6 F (36.4 C) 98 F (36.7 C)  TempSrc: Oral Oral Oral Oral  SpO2: 90% 95% 99% 96%  Weight:      Height:           Intake/Output   Yesterday:  05/14 0701 - 05/15 0700 In: 3185.5 [I.V.:2859.1; IV Piggyback:326.3] Out: 1551 [Urine:700; Emesis/NG output:776; Drains:75] This shift:  No intake/output data recorded.  Bowel function:  Flatus: No  BM:  No  Drain: (No drain)   Physical Exam:  General: Pt awake/alert.  Looks very uncomfortable.   Eyes: PERRL, normal EOM.  Sclera clear.  No icterus Neuro: CN II-XII intact w/o focal sensory/motor deficits. HENT: Normocephalic, Mucus membranes moist.  No thrush Chest: No pain to chest wall compression.  Good respiratory excursion.  No audible wheezing CV:  Pulses intact.  Regular rhythm.  No major extremity edema MS: Normal AROM mjr joints.  No obvious deformity  Abdomen: soft, mildly distended, some blood in wound vac, but not a significant amount.  Suction still holding.   NGT output bilious.  Ext:  No deformity.  No mjr edema.  No cyanosis Skin: No petechiae / purpurea.  No major sores.  Warm and dry    Results:   Cultures: Recent Results (from the past 720 hour(s))  Blood Culture (routine x 2)     Status: None (Preliminary result)   Collection Time: 01/14/20 12:08 AM   Specimen: BLOOD RIGHT HAND  Result Value Ref Range Status   Specimen Description   Final    BLOOD  RIGHT HAND Performed at [facility] [address] [place], [place] [address]    Special Requests   Final    BOTTLES DRAWN AEROBIC AND ANAEROBIC Blood Culture adequate volume Performed at [facility] [address], [place], [place] [address]    Culture   Final    NO GROWTH 2 DAYS Performed at [facility] [address] [place], [place] [address]    Report Status PENDING  Incomplete  Blood Culture (routine x 2)     Status: None (Preliminary result)   Collection Time: 01/14/20 12:57 AM   Specimen: BLOOD  Result Value Ref Range Status   Specimen Description   Final    BLOOD RIGHT ANTECUBITAL Performed at [facility] [address], [place], [place] [address]    Special Requests   Final    BOTTLES DRAWN AEROBIC AND ANAEROBIC Blood Culture adequate volume Performed at [facility] [address] [place], [place] [address]    Culture   Final    NO GROWTH 2 DAYS Performed at [facility] [address] [place], [place] [address]    Report Status PENDING  Incomplete  SARS Coronavirus 2 by RT PCR (hospital order, performed in [facility] hospital lab) Nasopharyngeal Nasopharyngeal Swab     Status: None   Collection Time: 01/14/20  3:23 AM   Specimen: Nasopharyngeal Swab  Result Value Ref Range Status   SARS Coronavirus 2 NEGATIVE NEGATIVE Final    Comment: (NOTE) SARS-CoV-2 target nucleic acids are NOT DETECTED. The SARS-CoV-2 RNA is generally detectable in upper and lower respiratory specimens during the acute phase of infection. The lowest concentration of SARS-CoV-2 viral copies this assay can detect is 250 copies / mL. A negative result does not preclude SARS-CoV-2 infection and should not be used as the sole basis for treatment or other patient management decisions.  A negative result may occur with improper specimen collection / handling, submission of specimen other than  nasopharyngeal swab, presence of viral mutation(s) within the areas targeted by this assay, and inadequate number of viral copies (<250 copies / mL). A negative result must be combined with clinical observations, patient history, and epidemiological information. Fact Sheet for Patients:   StrictlyIdeas.no Fact Sheet for Healthcare Providers: BankingDealers.co.za This test is not yet approved or cleared  by the [place] FDA and has been authorized for detection and/or diagnosis of SARS-CoV-2 by FDA under an Emergency Use Authorization (EUA).  This EUA will remain in effect (meaning this test can be used) for the duration of the COVID-19 declaration under Section 564(b)(1) of the Act, 21 U.S.C. section 360bbb-3(b)(1), unless the authorization is terminated or revoked sooner. Performed at [facility], [place] [address], [place],  [address]     Labs: Results for orders placed or performed during the  hospital encounter of 01/13/20 (from the past 48 hour(s))  Surgical pathology     Status: None   Collection Time: 01/14/20 12:12 PM  Result Value Ref Range   SURGICAL PATHOLOGY      SURGICAL PATHOLOGY CASE: [id number] PATIENT: Elvina Sidle Surgical Pathology Report     Clinical History: Ruptured volvulus (jmc)   FINAL MICROSCOPIC DIAGNOSIS:  A. COLON, RIGHT, RESECTION: -  Segment of colon with necrosis -  Four benign lymph nodes (0/4) -  Margins viable -  No malignancy identified   GROSS DESCRIPTION:  Received fresh is a 20 cm length segment of cecum/right colon, with 12 cm of attached terminal ileum.  Appendix is not identified.  The cecum/right colon is up to 10 cm in diameter and filled with brown fluid and soft fecal material.  The terminal ileum is kinked 5 cm proximal to the ileocecal valve, with a band of adhesions around this kinked area. On opening, this area has a stenotic lumen less  than 1 cm in diameter. The right colon is also slightly kinked 7 cm from the distal margin, and this area also has diffuse adhesions.  Remaining cecum/right colon has serosa varying from pink-red to dark green-red black with scattered adhes ions.  The mucosa of the terminal ileum is tan-pink to hyperemic, smooth, soft, with prominent edematous intestinal folds.  Mucosa of the right colon is predominantly yellow-green to dark red-black, flattened with scattered granularity.  There are scattered areas of tan-pink smooth, soft mucosa with widely spaced intestinal folds.  The wall of the right colon averages 0.1 cm thick with no obvious areas of perforation.  There are no mass lesions.  Margins of resection are grossly viable. Block summary: Block 1 = proximal margin Block 2 = distal margin Block 3 = sections at stenotic area of terminal ileum, including outer adhesions Block 4 = right colon Block 5 = sampling of 4 possible lymph nodes  SW 01/14/2020   Final Diagnosis performed by Thressa Sheller, MD.   Electronically signed 01/15/2020 Technical component performed at [facility], [place] [address] [place], [place] [address].  Professional component performed at [facility]. [place], [place] [address], [place], [place] [address].  Immunohistochemistry Technical component (if applicable) was performed at [facility]. [address] [place], [place], [place] [address].   IMMUNOHISTOCHEMISTRY DISCLAIMER (if applicable): Some of these immunohistochemical stains may have been developed and the performance characteristics determine by [facility]. Some may not have been cleared or approved by the U.S. Food and Drug Administration. The FDA has determined that such clearance or approval is not necessary. This test is used for clinical purposes. It should not be regarded as investigational or for research. This laboratory is certified under  the [place] (CLIA-88) as qualified to perform high complexity clinical laboratory testing.  The controls stained appropriately.   HIV Antibody (routine testing w rflx)     Status: None   Collection Time: 01/14/20 12:51 PM  Result Value Ref Range   HIV Screen 4th Generation wRfx Non Reactive Non Reactive    Comment: Performed at [facility], [place] [address] [place], [place] [address]  Glucose, capillary     Status: Abnormal   Collection Time: 01/14/20  5:47 PM  Result Value Ref Range   Glucose-Capillary 189 (H) 70 - 99 mg/dL    Comment: Glucose reference range applies only to samples taken after fasting for at least  8 hours.  Glucose, capillary     Status: Abnormal   Collection Time: 01/14/20  7:42 PM  Result Value Ref Range   Glucose-Capillary 173 (H) 70 - 99 mg/dL    Comment: Glucose reference range applies only to samples taken after fasting for at least 8 hours.  Glucose, capillary     Status: Abnormal   Collection Time: 01/14/20 11:32 PM  Result Value Ref Range   Glucose-Capillary 141 (H) 70 - 99 mg/dL    Comment: Glucose reference range applies only to samples taken after fasting for at least 8 hours.  Glucose, capillary     Status: Abnormal   Collection Time: 01/15/20  3:54 AM  Result Value Ref Range   Glucose-Capillary 111 (H) 70 - 99 mg/dL    Comment: Glucose reference range applies only to samples taken after fasting for at least 8 hours.  Comprehensive metabolic panel     Status: Abnormal   Collection Time: 01/15/20  8:00 AM  Result Value Ref Range   Sodium 137 135 - 145 mmol/L   Potassium 3.7 3.5 - 5.1 mmol/L   Chloride 100 98 - 111 mmol/L   CO2 28 22 - 32 mmol/L   Glucose, Bld 135 (H) 70 - 99 mg/dL    Comment: Glucose reference range applies only to samples taken after fasting for at least 8 hours.   BUN 15 8 - 23 mg/dL   Creatinine, Ser 0.51 0.44 - 1.00 mg/dL   Calcium 7.8 (L) 8.9 - 10.3 mg/dL   Total Protein 5.0  (L) 6.5 - 8.1 g/dL   Albumin 2.4 (L) 3.5 - 5.0 g/dL   AST 20 15 - 41 U/L   ALT 14 0 - 44 U/L   Alkaline Phosphatase 35 (L) 38 - 126 U/L   Total Bilirubin 1.1 0.3 - 1.2 mg/dL   GFR calc non Af Amer >60 >60 mL/min   GFR calc Af Amer >60 >60 mL/min   Anion gap 9 5 - 15    Comment: Performed at [facility] [place] [address] [place], [place] [address]  Magnesium     Status: None   Collection Time: 01/15/20  8:00 AM  Result Value Ref Range   Magnesium 1.7 1.7 - 2.4 mg/dL    Comment: Performed at [facility], [place] [address], [place], [place] [address]  Phosphorus     Status: Abnormal   Collection Time: 01/15/20  8:00 AM  Result Value Ref Range   Phosphorus 2.4 (L) 2.5 - 4.6 mg/dL    Comment: Performed at [facility], [place] [address] [place], [place] [address]  CBC with Differential/Platelet     Status: Abnormal   Collection Time: 01/15/20  8:00 AM  Result Value Ref Range   WBC 15.8 (H) 4.0 - 10.5 K/uL   RBC 3.21 (L) 3.87 - 5.11 MIL/uL   Hemoglobin 10.7 (L) 12.0 - 15.0 g/dL    Comment: REPEATED TO VERIFY DELTA CHECK NOTED    HCT 31.1 (L) 36.0 - 46.0 %   MCV 96.9 80.0 - 100.0 fL   MCH 33.3 26.0 - 34.0 pg   MCHC 34.4 30.0 - 36.0 g/dL   RDW 12.4 11.5 - 15.5 %   Platelets 237 150 - 400 K/uL   nRBC 0.0 0.0 - 0.2 %   Neutrophils Relative % 70 %   Neutro Abs 13.1 (H) 1.7 - 7.7 K/uL   Band Neutrophils 13 %   Lymphocytes Relative 8 %  Lymphs Abs 1.3 0.7 - 4.0 K/uL   Monocytes Relative 4 %   Monocytes Absolute 0.6 0.1 - 1.0 K/uL   Eosinophils Relative 0 %   Eosinophils Absolute 0.0 0.0 - 0.5 K/uL   Basophils Relative 0 %   Basophils Absolute 0.0 0.0 - 0.1 K/uL   WBC Morphology      MODERATE LEFT SHIFT (>5% METAS AND MYELOS,OCC PRO NOTED)    Comment: TOXIC GRANULATION   Metamyelocytes Relative 2 %   Myelocytes 3 %   Abs Immature Granulocytes 0.80 (H) 0.00 - 0.07 K/uL    Comment: Performed at [facility], [place],  [place] [address], [place], [place] [address]  Glucose, capillary     Status: Abnormal   Collection Time: 01/15/20  8:00 AM  Result Value Ref Range   Glucose-Capillary 122 (H) 70 - 99 mg/dL    Comment: Glucose reference range applies only to samples taken after fasting for at least 8 hours.  Glucose, capillary     Status: Abnormal   Collection Time: 01/15/20 12:06 PM  Result Value Ref Range   Glucose-Capillary 116 (H) 70 - 99 mg/dL    Comment: Glucose reference range applies only to samples taken after fasting for at least 8 hours.   Comment 1 Document in Chart   Glucose, capillary     Status: Abnormal   Collection Time: 01/15/20  4:15 PM  Result Value Ref Range   Glucose-Capillary 107 (H) 70 - 99 mg/dL    Comment: Glucose reference range applies only to samples taken after fasting for at least 8 hours.  Glucose, capillary     Status: None   Collection Time: 01/15/20  8:40 PM  Result Value Ref Range   Glucose-Capillary 89 70 - 99 mg/dL    Comment: Glucose reference range applies only to samples taken after fasting for at least 8 hours.  Glucose, capillary     Status: None   Collection Time: 01/16/20 12:04 AM  Result Value Ref Range   Glucose-Capillary 97 70 - 99 mg/dL    Comment: Glucose reference range applies only to samples taken after fasting for at least 8 hours.  Glucose, capillary     Status: None   Collection Time: 01/16/20  4:03 AM  Result Value Ref Range   Glucose-Capillary 98 70 - 99 mg/dL    Comment: Glucose reference range applies only to samples taken after fasting for at least 8 hours.  Comprehensive metabolic panel     Status: Abnormal   Collection Time: 01/16/20  7:16 AM  Result Value Ref Range   Sodium 136 135 - 145 mmol/L   Potassium 3.5 3.5 - 5.1 mmol/L   Chloride 100 98 - 111 mmol/L   CO2 30 22 - 32 mmol/L   Glucose, Bld 109 (H) 70 - 99 mg/dL    Comment: Glucose reference range applies only to samples taken after fasting for at least 8 hours.   BUN 14 8 -  23 mg/dL   Creatinine, Ser 0.54 0.44 - 1.00 mg/dL   Calcium 8.3 (L) 8.9 - 10.3 mg/dL   Total Protein 5.5 (L) 6.5 - 8.1 g/dL   Albumin 2.6 (L) 3.5 - 5.0 g/dL   AST 24 15 - 41 U/L   ALT 17 0 - 44 U/L   Alkaline Phosphatase 44 38 - 126 U/L   Total Bilirubin 1.0 0.3 - 1.2 mg/dL   GFR calc non Af Amer >60 >60 mL/min   GFR calc Af Amer >60 >  60 mL/min   Anion gap 6 5 - 15    Comment: Performed at [facility], [place] [address] [place], [place] [address]  Magnesium     Status: None   Collection Time: 01/16/20  7:16 AM  Result Value Ref Range   Magnesium 2.0 1.7 - 2.4 mg/dL    Comment: Performed at [facility], [place] [address] [place], [place] [address]  Phosphorus     Status: None   Collection Time: 01/16/20  7:16 AM  Result Value Ref Range   Phosphorus 2.7 2.5 - 4.6 mg/dL    Comment: Performed at [facility], [place] [address] [place], [place] [address]  CBC with Differential/Platelet     Status: Abnormal   Collection Time: 01/16/20  7:16 AM  Result Value Ref Range   WBC 13.2 (H) 4.0 - 10.5 K/uL   RBC 2.98 (L) 3.87 - 5.11 MIL/uL   Hemoglobin 9.8 (L) 12.0 - 15.0 g/dL   HCT 29.4 (L) 36.0 - 46.0 %   MCV 98.7 80.0 - 100.0 fL   MCH 32.9 26.0 - 34.0 pg   MCHC 33.3 30.0 - 36.0 g/dL   RDW 12.5 11.5 - 15.5 %   Platelets 258 150 - 400 K/uL   nRBC 0.0 0.0 - 0.2 %   Neutrophils Relative % 81 %   Neutro Abs 10.8 (H) 1.7 - 7.7 K/uL   Lymphocytes Relative 10 %   Lymphs Abs 1.3 0.7 - 4.0 K/uL   Monocytes Relative 7 %   Monocytes Absolute 0.9 0.1 - 1.0 K/uL   Eosinophils Relative 1 %   Eosinophils Absolute 0.1 0.0 - 0.5 K/uL   Basophils Relative 0 %   Basophils Absolute 0.0 0.0 - 0.1 K/uL   Immature Granulocytes 1 %   Abs Immature Granulocytes 0.07 0.00 - 0.07 K/uL    Comment: Performed at [facility], [place] [address] [place],  [address]  Glucose, capillary     Status: Abnormal   Collection Time: 01/16/20   7:34 AM  Result Value Ref Range   Glucose-Capillary 105 (H) 70 - 99 mg/dL    Comment: Glucose reference range applies only to samples taken after fasting for at least 8 hours.    Imaging / Studies: No results found.  Medications / Allergies: per chart  Antibiotics: Anti-infectives (From admission, onward)   Start     Dose/Rate Route Frequency Ordered Stop   01/14/20 1730  piperacillin-tazobactam (ZOSYN) IVPB 3.375 g     3.375 g 12.5 mL/hr over 240 Minutes Intravenous Every 8 hours 01/14/20 1723 01/19/20 1559   01/14/20 1001  sodium chloride 0.9 % with cefoTEtan (CEFOTAN) ADS Med    Note to Pharmacy: Jefm Miles   : cabinet override      01/14/20 1001 01/14/20 1059   01/14/20 0945  cefoTEtan (CEFOTAN) 2 g in sodium chloride 0.9 % 100 mL IVPB     2 g 200 mL/hr over 30 Minutes Intravenous On call to O.R. 01/14/20 0941 01/14/20 1040   01/14/20 0015  cefTRIAXone (ROCEPHIN) 2 g in sodium chloride 0.9 % 100 mL IVPB     2 g 200 mL/hr over 30 Minutes Intravenous  Once 01/14/20 0008 01/14/20 0158   01/14/20 0015  metroNIDAZOLE (FLAGYL) IVPB 500 mg     500 mg 100 mL/hr over 60 Minutes Intravenous  Once 01/14/20 0008 01/14/20 0243        Note: Portions of this report may have been  transcribed using voice recognition software. Every effort was made to ensure accuracy; however, inadvertent computerized transcription errors may be present.   Any transcriptional errors that result from this process are unintentional.    [name], MD, FACS, MASCRS Gastrointestinal and Minimally Invasive Surgery  [facility] [address], [place], [place] [address] [phone] Fax [phone] Main/Paging  CONTACT INFORMATION: Weekday (9AM-5PM) concerns: Call [facility] main office at [phone] Weeknight (5PM-9AM) or Weekend/Holiday concerns: Check [email or web address] for General Surgery [facility] coverage (Please, do not use SecureChat as it is not reliable communication to  surgeons for patient care)      01/16/2020  10:09 AM

## 2020-01-16 NOTE — Consult Note (Signed)
Medical Consultation   Margaret Kelly  [id number]  DOB: 18-Apr-1951  DOA: 01/13/2020  PCP: Dorena Dew, FNP    Requesting physician: Dr. Michael Boston ([facility])  Reason for consultation: Multiple medical problems   History of Present Illness:  69 year old WF PMHx anxiety, HTN, PAD, HLD, nerve damage left side secondary to fall, LEFT hip pain, Hx hepatitis C. tobacco abuse   Woman, patient of Dr. Lucio Edward with [facility], who presented to the ED overnight with severe generalized abdominal pain and inability to move her bowels or expel gas for the last few days.  She had been suffering from constipation on a regular basis, had a colonoscopy with Dr. Fuller Plan in March of this year.  Over the last several days she was having increasing abdominal pain constipation and bloating with several calls to the office.  Recommendations were given, including advice to come to the emergency department on May 10 when the patient reported being in agony.  She then delayed coming to the ED until last night.  ED provider notes indicate the patient was in severe pain, lab review notes white count of 25,000.  CT scan shows sigmoid volvulus at about 3:00 this morning.  A call was placed to the [facility] group, Dr. Alexis Frock, as they were the group on for the [facility] long ED.  Dr. Therisa Doyne apparently spoke to the endoscopy team and put the patient on for 7:30 AM sigmoidoscopy with one of her partners.  I received a message from Dr. Therisa Doyne about an hour ago stating that once they determined this was a patient of [facility], the case would then be given to me.  I immediately spoke to our endoscopy team, asked him to bring the patient over from the ED, and I came immediately to [facility] long hospital.  This patient is in agony right now and cannot give much else in the way of history or review of systems.  She says the pain has been steadily escalating. At the time my evaluation, the surgical PA is  also evaluating her.   5/14 afebrile overnight, A/O x4, negative S OB.  Positive abdominal pain especially when she coughs.  Nausea controlled on current regimen.  Abdominal pain not controlled on current regimen  5/15 afebrile overnight A/O x4, negative S OB.  States overnight increased abdominal pain so started on PCA pump.  Negative nausea.  ADDENDUM; NG tube began to suction frank blood, exam him wound VAC was occluded and blood pooling.    Review of Systems:  Review of Systems  Constitutional: Negative.   HENT: Positive for sore throat.   Eyes: Negative.   Respiratory: Positive for cough and sputum production.   Cardiovascular: Negative.   Gastrointestinal: Positive for abdominal pain and nausea. Negative for diarrhea and vomiting.  Genitourinary: Negative.   Musculoskeletal: Negative.   Skin: Negative.   Endo/Heme/Allergies: Negative.   Psychiatric/Behavioral: Negative for depression, hallucinations, memory loss, substance abuse and suicidal ideas. The patient is nervous/anxious. The patient does not have insomnia.      Past Medical History: Past Medical History:  Diagnosis Date  . Anxiety   . Blood transfusion without reported diagnosis 1982  . GERD (gastroesophageal reflux disease)   . History of hepatitis C 10/13/2015  . Hyperlipidemia LDL goal <100 10/13/2015  . Hypertension   . Muscle spasm of left lower extremity 10/13/2015  . Nerve damage 2001  left side as result of fall  . PAD (peripheral artery disease) ([place])   . Pain of left hip joint 02/21/2018  . Plantar wart     Past Surgical History: Past Surgical History:  Procedure Laterality Date  . ABDOMINAL AORTOGRAM W/LOWER EXTREMITY Bilateral 03/26/2019   Procedure: ABDOMINAL AORTOGRAM W/LOWER EXTREMITY;  Surgeon: Lorretta Harp, MD;  Location: [place];  Service: Cardiovascular;  Laterality: Bilateral;  . ABDOMINAL AORTOGRAM W/LOWER EXTREMITY Bilateral 08/03/2019   Procedure: ABDOMINAL AORTOGRAM  W/LOWER EXTREMITY;  Surgeon: Lorretta Harp, MD;  Location: [facility];  Service: Cardiovascular;  Laterality: Bilateral;  . ABDOMINAL HYSTERECTOMY  1982  . CARPAL TUNNEL RELEASE Left 2005  . foot spur Left 2007   foot  . LAPAROTOMY N/A 01/14/2020   Procedure: OPEN RIGHT COLECTOMY, EXPLORATORY LAPAROTOMY;  Surgeon: Michael Boston, MD;  Location: [facility];  Service: General;  Laterality: N/A;  . PERIPHERAL VASCULAR BALLOON ANGIOPLASTY Left 08/03/2019   Procedure: PERIPHERAL VASCULAR BALLOON ANGIOPLASTY;  Surgeon: Lorretta Harp, MD;  Location: [place] [facility];  Service: Cardiovascular;  Laterality: Left;  SFA/ popliteal     Allergies:   Allergies  Allergen Reactions  . Amlodipine Other (See Comments)    Ringing in ears     Social History:  reports that she has been smoking cigarettes. She has been smoking about 0.50 packs per day. She has quit using smokeless tobacco. She reports current alcohol use. She reports that she does not use drugs.   Family History: Family History  Problem Relation Age of Onset  . Colon cancer Neg Hx   . Esophageal cancer Neg Hx   . Rectal cancer Neg Hx   . Stomach cancer Neg Hx      Procedures/Significant Events:  5/13;EXPLORATORY LAPAROTOMY, ILEOCOLECTOMY, TAP BLOCK - BILATERAL    I have personally reviewed and interpreted all radiology studies and my findings are as above.  VENTILATOR SETTINGS: Nasal cannula 5/14 Flow 2 L/min SPO2 100%    Cultures   Antimicrobials: Anti-infectives (From admission, onward)   Start     Dose/Rate Stop   01/14/20 1730  piperacillin-tazobactam (ZOSYN) IVPB 3.375 g     3.375 g 12.5 mL/hr over 240 Minutes 01/19/20 1359   01/14/20 1001  sodium chloride 0.9 % with cefoTEtan (CEFOTAN) ADS Med    Note to Pharmacy: Jefm Miles   : cabinet override    01/14/20 1059   01/14/20 0945  cefoTEtan (CEFOTAN) 2 g in sodium chloride 0.9 % 100 mL IVPB     2 g 200 mL/hr over 30 Minutes 01/14/20 1040    01/14/20 0015  cefTRIAXone (ROCEPHIN) 2 g in sodium chloride 0.9 % 100 mL IVPB     2 g 200 mL/hr over 30 Minutes 01/14/20 0158   01/14/20 0015  metroNIDAZOLE (FLAGYL) IVPB 500 mg     500 mg 100 mL/hr over 60 Minutes 01/14/20 0243       Devices    LINES / TUBES:      Continuous Infusions: . chlorproMAZINE (THORAZINE) IV    . lactated ringers 10 mL/hr at 01/15/20 2254  . lactated ringers 125 mL/hr at 01/15/20 1415  . methocarbamol (ROBAXIN) IV 500 mg (01/16/20 1536)  . piperacillin-tazobactam (ZOSYN)  IV 3.375 g (01/16/20 1730)     Physical Exam: Vitals:   01/16/20 1518 01/16/20 1612 01/16/20 1757 01/16/20 1809  BP: (!) 158/70 (!) 161/66  (!) 164/76  Pulse: 92 90  89  Resp: 16 20 19  20  Temp: 99.5 F (37.5 C) 99.3 F (37.4 C)  98.4 F (36.9 C)  TempSrc: Oral Oral  Oral  SpO2: 94% 94% 95% 96%  Weight:      Height:       Physical Exam:  General: A/O x4, no acute respiratory distress Eyes: negative scleral hemorrhage, negative anisocoria, negative icterus ENT: Negative Runny nose, negative gingival bleeding, NG tube from right nare suctioning frank blood Neck:  Negative scars, masses, torticollis, lymphadenopathy, JVD Lungs: Clear to auscultation bilaterally without wheezes or crackles Cardiovascular: Regular rate and rhythm without murmur gallop or rub normal S1 and S2 Abdomen: Positive increasing abdominal pain, nondistended, negative soft, bowel sounds, no rebound, no ascites, no appreciable mass, wound VAC suprapubic area of abdomen occluded blood pooling underneath Tegaderm Extremities: No significant cyanosis, clubbing, or edema bilateral lower extremities Skin: Negative rashes, lesions, ulcers Psychiatric:  Negative depression, negative anxiety, negative fatigue, negative mania  Central nervous system:  Cranial nerves II through XII intact, tongue/uvula midline, all extremities muscle strength 5/5, sensation intact throughout, negative dysarthria, negative  expressive aphasia, negative receptive aphasia.    Data reviewed:  I have personally reviewed following labs and imaging studies Labs:  CBC: Recent Labs  Lab 01/13/20 2331 01/14/20 0950 01/15/20 0800 01/16/20 0716  WBC 24.8* 26.8* 15.8* 13.2*  NEUTROABS 20.1*  --  13.1* 10.8*  HGB 15.6* 14.4 10.7* 9.8*  HCT 44.4 41.3 31.1* 29.4*  MCV 94.1 95.2 96.9 98.7  PLT 355 306 237 0000000    Basic Metabolic Panel: Recent Labs  Lab 01/13/20 2331 01/13/20 2331 01/14/20 0950 01/14/20 0950 01/15/20 0800 01/16/20 0716  NA 134*  --  132*  --  137 136  K 3.7   < > 3.6   < > 3.7 3.5  CL 94*  --  95*  --  100 100  CO2 25  --  24  --  28 30  GLUCOSE 247*  --  233*  --  135* 109*  BUN 25*  --  21  --  15 14  CREATININE 1.04*  --  0.83  --  0.51 0.54  CALCIUM 9.8  --  8.7*  --  7.8* 8.3*  MG  --   --   --   --  1.7 2.0  PHOS  --   --   --   --  2.4* 2.7   < > = values in this interval not displayed.   GFR Estimated Creatinine Clearance: 64.8 mL/min (by C-G formula based on SCr of 0.54 mg/dL). Liver Function Tests: Recent Labs  Lab 01/13/20 2331 01/14/20 0950 01/15/20 0800 01/16/20 0716  AST 24 19 20 24   ALT 19 17 14 17   ALKPHOS 64 54 35* 44  BILITOT 1.4* 1.0 1.1 1.0  PROT 7.8 6.6 5.0* 5.5*  ALBUMIN 4.3 3.2* 2.4* 2.6*   Recent Labs  Lab 01/13/20 2331  LIPASE 23   No results for input(s): AMMONIA in the last 168 hours. Coagulation profile Recent Labs  Lab 01/14/20 0950  INR 1.1    Cardiac Enzymes: No results for input(s): CKTOTAL, CKMB, CKMBINDEX, TROPONINI in the last 168 hours. BNP: Invalid input(s): POCBNP CBG: Recent Labs  Lab 01/16/20 0004 01/16/20 0403 01/16/20 0734 01/16/20 1129 01/16/20 1608  GLUCAP 97 98 105* 97 111*   D-Dimer No results for input(s): DDIMER in the last 72 hours. Hgb A1c Recent Labs    01/14/20 0952  HGBA1C 6.8*   Lipid Profile No results for input(s): CHOL,  HDL, LDLCALC, TRIG, CHOLHDL, LDLDIRECT in the last 72 hours. Thyroid  function studies No results for input(s): TSH, T4TOTAL, T3FREE, THYROIDAB in the last 72 hours.  Invalid input(s): FREET3 Anemia work up No results for input(s): VITAMINB12, FOLATE, FERRITIN, TIBC, IRON, RETICCTPCT in the last 72 hours. Urinalysis    Component Value Date/Time   COLORURINE YELLOW 01/14/2020 0427   APPEARANCEUR CLEAR 01/14/2020 0427   LABSPEC 1.005 01/14/2020 0427   PHURINE 5.0 01/14/2020 0427   GLUCOSEU NEGATIVE 01/14/2020 0427   HGBUR NEGATIVE 01/14/2020 0427   BILIRUBINUR NEGATIVE 01/14/2020 0427   BILIRUBINUR neg 01/05/2020 1006   KETONESUR 5 (A) 01/14/2020 0427   PROTEINUR 30 (A) 01/14/2020 0427   UROBILINOGEN 0.2 01/05/2020 1006   UROBILINOGEN 0.2 11/20/2017 1049   NITRITE NEGATIVE 01/14/2020 0427   LEUKOCYTESUR NEGATIVE 01/14/2020 0427     Microbiology Recent Results (from the past 240 hour(s))  Blood Culture (routine x 2)     Status: None (Preliminary result)   Collection Time: 01/14/20 12:08 AM   Specimen: BLOOD RIGHT HAND  Result Value Ref Range Status   Specimen Description   Final    BLOOD RIGHT HAND Performed at [facility], [place] [address], [place], [place] [address]    Special Requests   Final    BOTTLES DRAWN AEROBIC AND ANAEROBIC Blood Culture adequate volume Performed at [facility] [address] [place], [place] [address]    Culture   Final    NO GROWTH 2 DAYS Performed at [facility] [address], [place], [place] [address]    Report Status PENDING  Incomplete  Blood Culture (routine x 2)     Status: None (Preliminary result)   Collection Time: 01/14/20 12:57 AM   Specimen: BLOOD  Result Value Ref Range Status   Specimen Description   Final    BLOOD RIGHT ANTECUBITAL Performed at [facility] [address], [place], [place] [address]    Special Requests   Final    BOTTLES DRAWN AEROBIC AND ANAEROBIC Blood Culture adequate volume Performed at [facility] [address], [place], [place] [address]    Culture   Final    NO GROWTH 2 DAYS Performed at [facility] [address], [place], [place] [address]    Report Status PENDING  Incomplete  SARS Coronavirus 2 by RT PCR (hospital order, performed in [facility] hospital lab) Nasopharyngeal Nasopharyngeal Swab     Status: None   Collection Time: 01/14/20  3:23 AM   Specimen: Nasopharyngeal Swab  Result Value Ref Range Status   SARS Coronavirus 2 NEGATIVE NEGATIVE Final    Comment: (NOTE) SARS-CoV-2 target nucleic acids are NOT DETECTED. The SARS-CoV-2 RNA is generally detectable in upper and lower respiratory specimens during the acute phase of infection. The lowest concentration of SARS-CoV-2 viral copies this assay can detect is 250 copies / mL. A negative result does not preclude SARS-CoV-2 infection and should not be used as the sole basis for treatment or other patient management decisions.  A negative result may occur with improper specimen collection / handling, submission of specimen other than nasopharyngeal swab, presence of viral mutation(s) within the areas targeted by this assay, and inadequate number of viral copies (<250 copies / mL). A negative result must be combined with clinical observations, patient history, and epidemiological information. Fact Sheet for Patients:   StrictlyIdeas.no Fact Sheet for Healthcare Providers: BankingDealers.co.za This test is not yet  approved or cleared  by the [place] and has been authorized for detection and/or diagnosis of SARS-CoV-2 by FDA under an Emergency Use Authorization (EUA).  This EUA will remain in effect (meaning this test can be used) for the duration of the COVID-19 declaration under Section 564(b)(1) of the Act, 21 U.S.C. section 360bbb-3(b)(1), unless the authorization is terminated or revoked sooner. Performed at [facility], [place] [address], [place], [place] [address]        Inpatient Medications:   Scheduled Meds: . Chlorhexidine Gluconate Cloth  6 each Topical Daily  . enoxaparin (LOVENOX) injection  40 mg Subcutaneous Q24H  . HYDROmorphone   Intravenous Q4H  . insulin aspart  0-15 Units Subcutaneous Q4H  . mouth rinse  15 mL Mouth Rinse BID  . pantoprazole (PROTONIX) IV  40 mg Intravenous QHS   Continuous Infusions: . chlorproMAZINE (THORAZINE) IV    . lactated ringers 10 mL/hr at 01/15/20 2254  . lactated ringers 125 mL/hr at 01/15/20 1415  . methocarbamol (ROBAXIN) IV 500 mg (01/16/20 1536)  . piperacillin-tazobactam (ZOSYN)  IV 3.375 g (01/16/20 1730)     Radiological Exams on Admission: No results found.  Impression/Recommendations Principal Problem:   Cecal volvulus s/p ileocolectomy 01/14/2020 Active Problems:   Essential hypertension   Hyperlipidemia LDL goal <100   History of hepatitis C   Pain of left hip joint   Peripheral arterial disease ([place])   Long term current use of anticoagulant therapy   Nerve damage   GERD (gastroesophageal reflux disease)   Blood transfusion without reported diagnosis   Anxiety   Bowel obstruction ([facility])   Sigmoid volvulus ([facility])  Essential HTN -5/15 Metoprolol IV 5 mg QID hold for MAP<65 -Hold all other BP medication at this time patient just s/p abdominal surgery  PAD -All home meds on hold secondary to abdominal surgery  HLD -All home meds on hold secondary to abdominal surgery  Pain regimen -PCA pump per surgery  Anxiety -Ativan 1 to 2 mg PRN   Tobacco abuse -Nicotine patch PRN  Sigmoid volvulus/Bowel obstruction -5/13 s/p ex lap see above -5/15 RN instructed to discontinue NG suction secondary to frank blood being suctioned, increasing abdominal pain.  Surgery paged to bedside  Refractory nausea -Zofran PRN -Thorazine PRN if Zofran fails to alleviate nausea      Thank you for this consultation.  Our [facility]  hospitalist team will follow the patient with you.   Time Spent: 35 minutes  Elois Averitt, Geraldo Docker M.D. [facility] 01/16/2020, 6:55 PM  [id number]

## 2020-01-17 LAB — COMPREHENSIVE METABOLIC PANEL
ALT: 36 U/L (ref 0–44)
AST: 47 U/L — ABNORMAL HIGH (ref 15–41)
Albumin: 2.4 g/dL — ABNORMAL LOW (ref 3.5–5.0)
Alkaline Phosphatase: 62 U/L (ref 38–126)
Anion gap: 14 (ref 5–15)
BUN: 10 mg/dL (ref 8–23)
CO2: 30 mmol/L (ref 22–32)
Calcium: 8.3 mg/dL — ABNORMAL LOW (ref 8.9–10.3)
Chloride: 95 mmol/L — ABNORMAL LOW (ref 98–111)
Creatinine, Ser: 0.54 mg/dL (ref 0.44–1.00)
GFR calc Af Amer: >60 mL/min (ref >60)
GFR calc non Af Amer: >60 mL/min (ref >60)
Glucose, Bld: 123 mg/dL — ABNORMAL HIGH (ref 70–99)
Potassium: 2.7 mmol/L — CL (ref 3.5–5.1)
Sodium: 139 mmol/L (ref 135–145)
Total Bilirubin: 1.4 mg/dL — ABNORMAL HIGH (ref 0.3–1.2)
Total Protein: 5.5 g/dL — ABNORMAL LOW (ref 6.5–8.1)

## 2020-01-17 LAB — CBC WITH DIFFERENTIAL/PLATELET
Abs Immature Granulocytes: 0.06 10*3/uL (ref 0.00–0.07)
Basophils Absolute: 0.1 10*3/uL (ref 0.0–0.1)
Basophils Relative: 0 %
Eosinophils Absolute: 0.2 10*3/uL (ref 0.0–0.5)
Eosinophils Relative: 2 %
HCT: 29.7 % — ABNORMAL LOW (ref 36.0–46.0)
Hemoglobin: 9.9 g/dL — ABNORMAL LOW (ref 12.0–15.0)
Immature Granulocytes: 1 %
Lymphocytes Relative: 13 %
Lymphs Abs: 1.5 10*3/uL (ref 0.7–4.0)
MCH: 32 pg (ref 26.0–34.0)
MCHC: 33.3 g/dL (ref 30.0–36.0)
MCV: 96.1 fL (ref 80.0–100.0)
Monocytes Absolute: 1 10*3/uL (ref 0.1–1.0)
Monocytes Relative: 8 %
Neutro Abs: 9 10*3/uL — ABNORMAL HIGH (ref 1.7–7.7)
Neutrophils Relative %: 76 %
Platelets: 295 10*3/uL (ref 150–400)
RBC: 3.09 MIL/uL — ABNORMAL LOW (ref 3.87–5.11)
RDW: 12.5 % (ref 11.5–15.5)
WBC: 11.8 10*3/uL — ABNORMAL HIGH (ref 4.0–10.5)
nRBC: 0 % (ref 0.0–0.2)

## 2020-01-17 LAB — GLUCOSE, CAPILLARY
Glucose-Capillary: 113 mg/dL — ABNORMAL HIGH (ref 70–99)
Glucose-Capillary: 116 mg/dL — ABNORMAL HIGH (ref 70–99)
Glucose-Capillary: 119 mg/dL — ABNORMAL HIGH (ref 70–99)
Glucose-Capillary: 120 mg/dL — ABNORMAL HIGH (ref 70–99)
Glucose-Capillary: 130 mg/dL — ABNORMAL HIGH (ref 70–99)
Glucose-Capillary: 144 mg/dL — ABNORMAL HIGH (ref 70–99)
Glucose-Capillary: 156 mg/dL — ABNORMAL HIGH (ref 70–99)

## 2020-01-17 LAB — MAGNESIUM: Magnesium: 1.6 mg/dL — ABNORMAL LOW (ref 1.7–2.4)

## 2020-01-17 LAB — PHOSPHORUS: Phosphorus: 3.7 mg/dL (ref 2.5–4.6)

## 2020-01-17 MED ORDER — HYDRALAZINE HCL 20 MG/ML IJ SOLN
7.0000 mg | Freq: Four times a day (QID) | INTRAMUSCULAR | Status: DC | PRN
  Administered 2020-01-17: 7 mg via INTRAVENOUS
  Filled 2020-01-17: qty 1

## 2020-01-17 MED ORDER — FUROSEMIDE 10 MG/ML IJ SOLN
60.0000 mg | Freq: Once | INTRAMUSCULAR | Status: AC
  Administered 2020-01-17: 60 mg via INTRAVENOUS
  Filled 2020-01-17: qty 6

## 2020-01-17 MED ORDER — IPRATROPIUM-ALBUTEROL 0.5-2.5 (3) MG/3ML IN SOLN
3.0000 mL | Freq: Three times a day (TID) | RESPIRATORY_TRACT | Status: DC
  Administered 2020-01-17 – 2020-01-18 (×4): 3 mL via RESPIRATORY_TRACT
  Filled 2020-01-17 (×4): qty 3

## 2020-01-17 MED ORDER — POTASSIUM CHLORIDE 10 MEQ/100ML IV SOLN
10.0000 meq | INTRAVENOUS | Status: AC
  Administered 2020-01-17 (×6): 10 meq via INTRAVENOUS
  Filled 2020-01-17 (×6): qty 100

## 2020-01-17 MED ORDER — MAGNESIUM SULFATE 50 % IJ SOLN
3.0000 g | Freq: Once | INTRAVENOUS | Status: AC
  Administered 2020-01-17: 3 g via INTRAVENOUS
  Filled 2020-01-17: qty 6

## 2020-01-17 MED ORDER — HYDRALAZINE HCL 20 MG/ML IJ SOLN: 5.0000 mg | Freq: Four times a day (QID) | INTRAMUSCULAR | Status: DC | PRN

## 2020-01-17 MED ORDER — BISACODYL 10 MG RE SUPP: 10.0000 mg | Freq: Once | RECTAL | Status: DC

## 2020-01-17 MED ORDER — HYDROMORPHONE HCL 1 MG/ML IJ SOLN
0.5000 mg | INTRAMUSCULAR | Status: DC | PRN
  Administered 2020-01-17: 1 mg via INTRAVENOUS
  Administered 2020-01-17: 2 mg via INTRAVENOUS
  Administered 2020-01-17 – 2020-01-18 (×3): 1 mg via INTRAVENOUS
  Administered 2020-01-18 (×2): 2 mg via INTRAVENOUS
  Administered 2020-01-19: 1 mg via INTRAVENOUS
  Filled 2020-01-17 (×2): qty 2
  Filled 2020-01-17 (×5): qty 1
  Filled 2020-01-17: qty 2

## 2020-01-17 NOTE — Progress Notes (Signed)
CRITICAL VALUE ALERT  Critical Value:  Potassium 2.7  Date & Time Notied:  01/17/20; 0825  Provider Notified: Dia Crawford  Orders Received/Actions taken: Potassium Chloride 10 mEq IVPB q1h x 6.

## 2020-01-17 NOTE — Progress Notes (Signed)
Margaret Kelly [id number] 18-Oct-1950  CARE TEAM:  PCP: Dorena Dew, FNP  Outpatient Care Team: Patient Care Team: Dorena Dew, FNP as PCP - General (Family Medicine) [name], MD as PCP - Cardiology (Cardiology) [name], MD as Consulting Physician (Gastroenterology)  Inpatient Treatment Team: Treatment Team: Attending Provider: Edison Pace, Md, MD; Consulting Physician: Edison Pace, Md, MD; [place] Nurse: Tenna Child, RN; Rounding Team: Suzan Garibaldi, MD   Problem List:   Principal Problem:   Cecal volvulus s/p ileocolectomy 01/14/2020 Active Problems:   Essential hypertension   Hyperlipidemia LDL goal <100   History of hepatitis C   Pain of left hip joint   Peripheral arterial disease ([place])   Long term current use of anticoagulant therapy   Nerve damage   GERD (gastroesophageal reflux disease)   Blood transfusion without reported diagnosis   Anxiety   Bowel obstruction ([place])   Sigmoid volvulus ([place])   3 Days Post-Op  01/14/2020  PRE-OPERATIVE DIAGNOSIS:  SIGMOID VOLVULUS  POST-OPERATIVE DIAGNOSIS:  CECAL VOLVULUS with ischemia   PROCEDURE:   EXPLORATORY LAPAROTOMY ILEOCOLECTOMY TAP BLOCK - BILATERAL  SURGEON:  [name], MD    Assessment  Stabilizing  [facility] Stay = 3 days)  Plan:  IV antibiotics x5 days with evidence of PE tubes.  Ischemia gangrene phlegmon   IV fluids.  Continue NGT/NPO for ileus.  Try suppository today.  If no bowel function by tomorrow/tuesday, consider imaging.    D/c foley.  External catheter should be Ok.    Pain control.  PCA.  Mobilize as tolerated.  Hypokalemia, hypomagnesemia- getting repleted.    Enoxaparin ppx for now.  Would not do full anticoagulation yet as wound still a little bloody.    Hypertension control.   Wound VAC changed and seems to be working well.  If clogs again with blood/oozing, can switch to packing twice daily over the weekend and then daily starting next week.  I  updated the patient's status to the patient and nurse.  Recommendations were made.  Questions were answered.  They expressed understanding & appreciation.  -VTE prophylaxis- SCDs, etc -mobilize as tolerated to help recovery   01/17/2020    Subjective: (Chief complaint)  Pt without new complaints today.  Improved pain control with PCA    Objective:  Vital signs:  Vitals:   01/17/20 0420 01/17/20 0426 01/17/20 0744 01/17/20 0758  BP: (!) 160/69     Pulse: 82     Resp: 17 16 18    Temp: 97.9 F (36.6 C)     TempSrc: Oral     SpO2: 96% 94% 94% 97%  Weight:      Height:           Intake/Output   Yesterday:  05/15 0701 - 05/16 0700 In: 938.6 [I.V.:728.4; IV Piggyback:210.2] Out: 4700 [Urine:3550; Emesis/NG output:1150] This shift:  No intake/output data recorded.  Bowel function:  Flatus: No  BM:  No  Drain: (No drain)   Physical Exam:  General: Pt awake/alert.  Looks more comfortable today.    Eyes: PERRL, normal EOM.  Sclera clear.  No icterus Chest: No pain to chest wall compression.  Good respiratory excursion.   CV:  Pulses intact.  Regular rhythm.  No major extremity edema Abdomen: soft, mildly distended, some blood in wound vac, but not a significant amount.  Suction still holding.   NGT output bilious. Ext:  No deformity.  No mjr edema.  No cyanosis Skin: No petechiae /  purpurea.  No major sores.  Warm and dry    Results:   Cultures: Recent Results (from the past 720 hour(s))  Blood Culture (routine x 2)     Status: None (Preliminary result)   Collection Time: 01/14/20 12:08 AM   Specimen: BLOOD RIGHT HAND  Result Value Ref Range Status   Specimen Description   Final    BLOOD RIGHT HAND Performed at [facility] [address] [place], [place] [address]    Special Requests   Final    BOTTLES DRAWN AEROBIC AND ANAEROBIC Blood Culture adequate volume Performed at [place] [address]  [place], [place] [address]    Culture   Final    NO GROWTH 3 DAYS Performed at [facility], [place] [address], [place], [place] [address]    Report Status PENDING  Incomplete  Blood Culture (routine x 2)     Status: None (Preliminary result)   Collection Time: 01/14/20 12:57 AM   Specimen: BLOOD  Result Value Ref Range Status   Specimen Description   Final    BLOOD RIGHT ANTECUBITAL Performed at [facility] [address], [place], [place] [address]    Special Requests   Final    BOTTLES DRAWN AEROBIC AND ANAEROBIC Blood Culture adequate volume Performed at [place] [address] [place], [place] [address]    Culture   Final    NO GROWTH 3 DAYS Performed at [facility], [place] [address], [place], [place] [address]    Report Status PENDING  Incomplete  SARS Coronavirus 2 by RT PCR (hospital order, performed in [facility] hospital lab) Nasopharyngeal Nasopharyngeal Swab     Status: None   Collection Time: 01/14/20  3:23 AM   Specimen: Nasopharyngeal Swab  Result Value Ref Range Status   SARS Coronavirus 2 NEGATIVE NEGATIVE Final    Comment: (NOTE) SARS-CoV-2 target nucleic acids are NOT DETECTED. The SARS-CoV-2 RNA is generally detectable in upper and lower respiratory specimens during the acute phase of infection. The lowest concentration of SARS-CoV-2 viral copies this assay can detect is 250 copies / mL. A negative result does not preclude SARS-CoV-2 infection and should not be used as the sole basis for treatment or other patient management decisions.  A negative result may occur with improper specimen collection / handling, submission of specimen other than nasopharyngeal swab, presence of viral mutation(s) within the areas targeted by this assay, and inadequate number of viral copies (<250 copies / mL). A negative result must be combined with clinical observations, patient history, and epidemiological information. Fact  Sheet for Patients:   StrictlyIdeas.no Fact Sheet for Healthcare Providers: BankingDealers.co.za This test is not yet approved or cleared  by the [place] FDA and has been authorized for detection and/or diagnosis of SARS-CoV-2 by FDA under an Emergency Use Authorization (EUA).  This EUA will remain in effect (meaning this test can be used) for the duration of the COVID-19 declaration under Section 564(b)(1) of the Act, 21 U.S.C. section 360bbb-3(b)(1), unless the authorization is terminated or revoked sooner. Performed at [facility], [place] [address] [place],  [address]     Labs: Results for orders placed or performed during the hospital encounter of 01/13/20 (from the past 48 hour(s))  Glucose, capillary     Status: Abnormal   Collection Time: 01/15/20 12:06 PM  Result Value Ref Range   Glucose-Capillary 116 (H) 70 - 99 mg/dL  Comment: Glucose reference range applies only to samples taken after fasting for at least 8 hours.   Comment 1 Document in Chart   Glucose, capillary     Status: Abnormal   Collection Time: 01/15/20  4:15 PM  Result Value Ref Range   Glucose-Capillary 107 (H) 70 - 99 mg/dL    Comment: Glucose reference range applies only to samples taken after fasting for at least 8 hours.  Glucose, capillary     Status: None   Collection Time: 01/15/20  8:40 PM  Result Value Ref Range   Glucose-Capillary 89 70 - 99 mg/dL    Comment: Glucose reference range applies only to samples taken after fasting for at least 8 hours.  Glucose, capillary     Status: None   Collection Time: 01/16/20 12:04 AM  Result Value Ref Range   Glucose-Capillary 97 70 - 99 mg/dL    Comment: Glucose reference range applies only to samples taken after fasting for at least 8 hours.  Glucose, capillary     Status: None   Collection Time: 01/16/20  4:03 AM  Result Value Ref Range   Glucose-Capillary 98 70 - 99 mg/dL      Comment: Glucose reference range applies only to samples taken after fasting for at least 8 hours.  Comprehensive metabolic panel     Status: Abnormal   Collection Time: 01/16/20  7:16 AM  Result Value Ref Range   Sodium 136 135 - 145 mmol/L   Potassium 3.5 3.5 - 5.1 mmol/L   Chloride 100 98 - 111 mmol/L   CO2 30 22 - 32 mmol/L   Glucose, Bld 109 (H) 70 - 99 mg/dL    Comment: Glucose reference range applies only to samples taken after fasting for at least 8 hours.   BUN 14 8 - 23 mg/dL   Creatinine, Ser 0.54 0.44 - 1.00 mg/dL   Calcium 8.3 (L) 8.9 - 10.3 mg/dL   Total Protein 5.5 (L) 6.5 - 8.1 g/dL   Albumin 2.6 (L) 3.5 - 5.0 g/dL   AST 24 15 - 41 U/L   ALT 17 0 - 44 U/L   Alkaline Phosphatase 44 38 - 126 U/L   Total Bilirubin 1.0 0.3 - 1.2 mg/dL   GFR calc non Af Amer >60 >60 mL/min   GFR calc Af Amer >60 >60 mL/min   Anion gap 6 5 - 15    Comment: Performed at [facility], [place] [address], [place], [place] [address]  Magnesium     Status: None   Collection Time: 01/16/20  7:16 AM  Result Value Ref Range   Magnesium 2.0 1.7 - 2.4 mg/dL    Comment: Performed at [facility], [place] [address] [place], [place] [address]  Phosphorus     Status: None   Collection Time: 01/16/20  7:16 AM  Result Value Ref Range   Phosphorus 2.7 2.5 - 4.6 mg/dL    Comment: Performed at [facility], [place] [address] [place], [place] [address]  CBC with Differential/Platelet     Status: Abnormal   Collection Time: 01/16/20  7:16 AM  Result Value Ref Range   WBC 13.2 (H) 4.0 - 10.5 K/uL   RBC 2.98 (L) 3.87 - 5.11 MIL/uL   Hemoglobin 9.8 (L) 12.0 - 15.0 g/dL   HCT 29.4 (L) 36.0 - 46.0 %   MCV 98.7 80.0 - 100.0 fL   MCH 32.9 26.0 - 34.0 pg   MCHC 33.3  30.0 - 36.0 g/dL   RDW 12.5 11.5 - 15.5 %   Platelets 258 150 - 400 K/uL   nRBC 0.0 0.0 - 0.2 %   Neutrophils Relative % 81 %   Neutro Abs 10.8 (H) 1.7 - 7.7 K/uL   Lymphocytes Relative 10  %   Lymphs Abs 1.3 0.7 - 4.0 K/uL   Monocytes Relative 7 %   Monocytes Absolute 0.9 0.1 - 1.0 K/uL   Eosinophils Relative 1 %   Eosinophils Absolute 0.1 0.0 - 0.5 K/uL   Basophils Relative 0 %   Basophils Absolute 0.0 0.0 - 0.1 K/uL   Immature Granulocytes 1 %   Abs Immature Granulocytes 0.07 0.00 - 0.07 K/uL    Comment: Performed at [facility], [place] [address] [place], [place] [address]  Glucose, capillary     Status: Abnormal   Collection Time: 01/16/20  7:34 AM  Result Value Ref Range   Glucose-Capillary 105 (H) 70 - 99 mg/dL    Comment: Glucose reference range applies only to samples taken after fasting for at least 8 hours.  Glucose, capillary     Status: None   Collection Time: 01/16/20 11:29 AM  Result Value Ref Range   Glucose-Capillary 97 70 - 99 mg/dL    Comment: Glucose reference range applies only to samples taken after fasting for at least 8 hours.  Glucose, capillary     Status: Abnormal   Collection Time: 01/16/20  4:08 PM  Result Value Ref Range   Glucose-Capillary 111 (H) 70 - 99 mg/dL    Comment: Glucose reference range applies only to samples taken after fasting for at least 8 hours.  CBC     Status: Abnormal   Collection Time: 01/16/20  7:10 PM  Result Value Ref Range   WBC 14.8 (H) 4.0 - 10.5 K/uL   RBC 3.06 (L) 3.87 - 5.11 MIL/uL   Hemoglobin 10.0 (L) 12.0 - 15.0 g/dL   HCT 29.7 (L) 36.0 - 46.0 %   MCV 97.1 80.0 - 100.0 fL   MCH 32.7 26.0 - 34.0 pg   MCHC 33.7 30.0 - 36.0 g/dL   RDW 12.5 11.5 - 15.5 %   Platelets 288 150 - 400 K/uL   nRBC 0.0 0.0 - 0.2 %    Comment: Performed at [facility], [place] [address], [place], [place] [address]  Glucose, capillary     Status: Abnormal   Collection Time: 01/16/20  8:27 PM  Result Value Ref Range   Glucose-Capillary 103 (H) 70 - 99 mg/dL    Comment: Glucose reference range applies only to samples taken after fasting for at least 8 hours.  Glucose, capillary     Status:  Abnormal   Collection Time: 01/17/20 12:08 AM  Result Value Ref Range   Glucose-Capillary 116 (H) 70 - 99 mg/dL    Comment: Glucose reference range applies only to samples taken after fasting for at least 8 hours.  Glucose, capillary     Status: Abnormal   Collection Time: 01/17/20  4:22 AM  Result Value Ref Range   Glucose-Capillary 119 (H) 70 - 99 mg/dL    Comment: Glucose reference range applies only to samples taken after fasting for at least 8 hours.  Comprehensive metabolic panel     Status: Abnormal   Collection Time: 01/17/20  6:29 AM  Result Value Ref Range   Sodium 139 135 - 145 mmol/L   Potassium 2.7 (LL) 3.5 - 5.1 mmol/L  Comment: DELTA CHECK NOTED CRITICAL RESULT CALLED TO, READ BACK BY AND VERIFIED WITH: WILSON,K RN @0826  ON 01/17/2020 JACKSON,K    Chloride 95 (L) 98 - 111 mmol/L   CO2 30 22 - 32 mmol/L   Glucose, Bld 123 (H) 70 - 99 mg/dL    Comment: Glucose reference range applies only to samples taken after fasting for at least 8 hours.   BUN 10 8 - 23 mg/dL   Creatinine, Ser 0.54 0.44 - 1.00 mg/dL   Calcium 8.3 (L) 8.9 - 10.3 mg/dL   Total Protein 5.5 (L) 6.5 - 8.1 g/dL   Albumin 2.4 (L) 3.5 - 5.0 g/dL   AST 47 (H) 15 - 41 U/L   ALT 36 0 - 44 U/L   Alkaline Phosphatase 62 38 - 126 U/L   Total Bilirubin 1.4 (H) 0.3 - 1.2 mg/dL   GFR calc non Af Amer >60 >60 mL/min   GFR calc Af Amer >60 >60 mL/min   Anion gap 14 5 - 15    Comment: Performed at [facility], [place] [address] [place], [place] [address]  Magnesium     Status: Abnormal   Collection Time: 01/17/20  6:29 AM  Result Value Ref Range   Magnesium 1.6 (L) 1.7 - 2.4 mg/dL    Comment: Performed at [facility], [place] [address], [place], [place] [address]  Phosphorus     Status: None   Collection Time: 01/17/20  6:29 AM  Result Value Ref Range   Phosphorus 3.7 2.5 - 4.6 mg/dL    Comment: Performed at [facility], [place] [address] [place],  [place] [address]  CBC with Differential/Platelet     Status: Abnormal   Collection Time: 01/17/20  6:29 AM  Result Value Ref Range   WBC 11.8 (H) 4.0 - 10.5 K/uL   RBC 3.09 (L) 3.87 - 5.11 MIL/uL   Hemoglobin 9.9 (L) 12.0 - 15.0 g/dL   HCT 29.7 (L) 36.0 - 46.0 %   MCV 96.1 80.0 - 100.0 fL   MCH 32.0 26.0 - 34.0 pg   MCHC 33.3 30.0 - 36.0 g/dL   RDW 12.5 11.5 - 15.5 %   Platelets 295 150 - 400 K/uL   nRBC 0.0 0.0 - 0.2 %   Neutrophils Relative % 76 %   Neutro Abs 9.0 (H) 1.7 - 7.7 K/uL   Lymphocytes Relative 13 %   Lymphs Abs 1.5 0.7 - 4.0 K/uL   Monocytes Relative 8 %   Monocytes Absolute 1.0 0.1 - 1.0 K/uL   Eosinophils Relative 2 %   Eosinophils Absolute 0.2 0.0 - 0.5 K/uL   Basophils Relative 0 %   Basophils Absolute 0.1 0.0 - 0.1 K/uL   Immature Granulocytes 1 %   Abs Immature Granulocytes 0.06 0.00 - 0.07 K/uL    Comment: Performed at [facility], [place] [address], [place], [place] [address]  Glucose, capillary     Status: Abnormal   Collection Time: 01/17/20  7:44 AM  Result Value Ref Range   Glucose-Capillary 113 (H) 70 - 99 mg/dL    Comment: Glucose reference range applies only to samples taken after fasting for at least 8 hours.    Imaging / Studies: No results found.  Medications / Allergies: per chart  Antibiotics: Anti-infectives (From admission, onward)   Start     Dose/Rate Route Frequency Ordered Stop   01/14/20 1730  piperacillin-tazobactam (ZOSYN) IVPB 3.375 g  3.375 g 12.5 mL/hr over 240 Minutes Intravenous Every 8 hours 01/14/20 1723 01/19/20 1559   01/14/20 1001  sodium chloride 0.9 % with cefoTEtan (CEFOTAN) ADS Med    Note to Pharmacy: Jefm Miles   : cabinet override      01/14/20 1001 01/14/20 1059   01/14/20 0945  cefoTEtan (CEFOTAN) 2 g in sodium chloride 0.9 % 100 mL IVPB     2 g 200 mL/hr over 30 Minutes Intravenous On call to O.R. 01/14/20 0941 01/14/20 1040   01/14/20 0015  cefTRIAXone (ROCEPHIN) 2 g in sodium chloride  0.9 % 100 mL IVPB     2 g 200 mL/hr over 30 Minutes Intravenous  Once 01/14/20 0008 01/14/20 0158   01/14/20 0015  metroNIDAZOLE (FLAGYL) IVPB 500 mg     500 mg 100 mL/hr over 60 Minutes Intravenous  Once 01/14/20 0008 01/14/20 0243        Note: Portions of this report may have been transcribed using voice recognition software. Every effort was made to ensure accuracy; however, inadvertent computerized transcription errors may be present.   Any transcriptional errors that result from this process are unintentional.   Milus Height, MD [facility] Surgical Oncology, General Surgery, Trauma and [place] [facility], [place] for weekday/non holidays Check [email or web address] for coverage night/weekend/holidays  Do not use SecureChat as it is not reliable for timely patient care.

## 2020-01-17 NOTE — Consult Note (Signed)
Medical Consultation   Margaret Kelly  [id number]  DOB: 05-31-1951  DOA: 01/13/2020  PCP: Dorena Dew, FNP    Requesting physician: Dr. Michael Boston ([facility])  Reason for consultation: Multiple medical problems   History of Present Illness:  69 year old WF PMHx anxiety, HTN, PAD, HLD, nerve damage left side secondary to fall, LEFT hip pain, Hx hepatitis C. tobacco abuse   Woman, patient of Dr. Lucio Edward with [facility], who presented to the ED overnight with severe generalized abdominal pain and inability to move her bowels or expel gas for the last few days.  She had been suffering from constipation on a regular basis, had a colonoscopy with Dr. Fuller Plan in March of this year.  Over the last several days she was having increasing abdominal pain constipation and bloating with several calls to the office.  Recommendations were given, including advice to come to the emergency department on May 10 when the patient reported being in agony.  She then delayed coming to the ED until last night.  ED provider notes indicate the patient was in severe pain, lab review notes white count of 25,000.  CT scan shows sigmoid volvulus at about 3:00 this morning.  A call was placed to the [facility] group, Dr. Alexis Frock, as they were the group on for the [facility] long ED.  Dr. Therisa Doyne apparently spoke to the endoscopy team and put the patient on for 7:30 AM sigmoidoscopy with one of her partners.  I received a message from Dr. Therisa Doyne about an hour ago stating that once they determined this was a patient of [facility], the case would then be given to me.  I immediately spoke to our endoscopy team, asked him to bring the patient over from the ED, and I came immediately to [facility] hospital.  This patient is in agony right now and cannot give much else in the way of history or review of systems.  She says the pain has been steadily escalating. At the time my evaluation, the surgical PA is  also evaluating her.   5/14 afebrile overnight, A/O x4, negative S OB.  Positive abdominal pain especially when she coughs.  Nausea controlled on current regimen.  Abdominal pain not controlled on current regimen  5/15 afebrile overnight A/O x4, negative S OB.  States overnight increased abdominal pain so started on PCA pump.  Negative nausea.  ADDENDUM; NG tube began to suction frank blood, exam him wound VAC was occluded and blood pooling.  5/16 A/O x4 patient states abdominal pain has decreased, surgery came by last night as well patient restarted suction on NG tube today no further bright red blood per suction tube.  Patient states nausea improved and much more comfortable.    Review of Systems:  Review of Systems  Constitutional: Negative.   HENT: Positive for sore throat.   Eyes: Negative.   Respiratory: Positive for cough and sputum production.   Cardiovascular: Negative.   Gastrointestinal: Positive for abdominal pain and nausea. Negative for diarrhea and vomiting.  Genitourinary: Negative.   Musculoskeletal: Negative.   Skin: Negative.   Endo/Heme/Allergies: Negative.   Psychiatric/Behavioral: Negative for depression, hallucinations, memory loss, substance abuse and suicidal ideas. The patient is nervous/anxious. The patient does not have insomnia.      Past Medical History: Past Medical History:  Diagnosis Date  . Anxiety   . Blood transfusion without reported diagnosis  1982  . GERD (gastroesophageal reflux disease)   . History of hepatitis C 10/13/2015  . Hyperlipidemia LDL goal <100 10/13/2015  . Hypertension   . Muscle spasm of left lower extremity 10/13/2015  . Nerve damage 2001   left side as result of fall  . PAD (peripheral artery disease) ([place])   . Pain of left hip joint 02/21/2018  . Plantar wart     Past Surgical History: Past Surgical History:  Procedure Laterality Date  . ABDOMINAL AORTOGRAM W/LOWER EXTREMITY Bilateral 03/26/2019   Procedure: ABDOMINAL  AORTOGRAM W/LOWER EXTREMITY;  Surgeon: Lorretta Harp, MD;  Location: [place] [facility];  Service: Cardiovascular;  Laterality: Bilateral;  . ABDOMINAL AORTOGRAM W/LOWER EXTREMITY Bilateral 08/03/2019   Procedure: ABDOMINAL AORTOGRAM W/LOWER EXTREMITY;  Surgeon: Lorretta Harp, MD;  Location: [place] [facility];  Service: Cardiovascular;  Laterality: Bilateral;  . ABDOMINAL HYSTERECTOMY  1982  . CARPAL TUNNEL RELEASE Left 2005  . foot spur Left 2007   foot  . LAPAROTOMY N/A 01/14/2020   Procedure: OPEN RIGHT COLECTOMY, EXPLORATORY LAPAROTOMY;  Surgeon: Michael Boston, MD;  Location: [facility];  Service: General;  Laterality: N/A;  . PERIPHERAL VASCULAR BALLOON ANGIOPLASTY Left 08/03/2019   Procedure: PERIPHERAL VASCULAR BALLOON ANGIOPLASTY;  Surgeon: Lorretta Harp, MD;  Location: [place] [facility];  Service: Cardiovascular;  Laterality: Left;  SFA/ popliteal     Allergies:   Allergies  Allergen Reactions  . Amlodipine Other (See Comments)    Ringing in ears     Social History:  reports that she has been smoking cigarettes. She has been smoking about 0.50 packs per day. She has quit using smokeless tobacco. She reports current alcohol use. She reports that she does not use drugs.   Family History: Family History  Problem Relation Age of Onset  . Colon cancer Neg Hx   . Esophageal cancer Neg Hx   . Rectal cancer Neg Hx   . Stomach cancer Neg Hx      Procedures/Significant Events:  5/13;EXPLORATORY LAPAROTOMY, ILEOCOLECTOMY, TAP BLOCK - BILATERAL    I have personally reviewed and interpreted all radiology studies and my findings are as above.  VENTILATOR SETTINGS: Nasal cannula 5/14 Flow 2 L/min SPO2 100%    Cultures   Antimicrobials: Anti-infectives (From admission, onward)   Start     Dose/Rate Stop   01/14/20 1730  piperacillin-tazobactam (ZOSYN) IVPB 3.375 g     3.375 g 12.5 mL/hr over 240 Minutes 01/19/20 1359   01/14/20 1001  sodium chloride 0.9 %  with cefoTEtan (CEFOTAN) ADS Med    Note to Pharmacy: Jefm Miles   : cabinet override    01/14/20 1059   01/14/20 0945  cefoTEtan (CEFOTAN) 2 g in sodium chloride 0.9 % 100 mL IVPB     2 g 200 mL/hr over 30 Minutes 01/14/20 1040   01/14/20 0015  cefTRIAXone (ROCEPHIN) 2 g in sodium chloride 0.9 % 100 mL IVPB     2 g 200 mL/hr over 30 Minutes 01/14/20 0158   01/14/20 0015  metroNIDAZOLE (FLAGYL) IVPB 500 mg     500 mg 100 mL/hr over 60 Minutes 01/14/20 0243       Devices    LINES / TUBES:      Continuous Infusions: . chlorproMAZINE (THORAZINE) IV    . lactated ringers 10 mL/hr at 01/15/20 2254  . lactated ringers 125 mL/hr at 01/15/20 1415  . methocarbamol (ROBAXIN) IV 500 mg (01/17/20 0426)  . piperacillin-tazobactam (ZOSYN)  IV 3.375 g (01/17/20 0051)     Physical Exam: Vitals:   01/17/20 0420 01/17/20 0426 01/17/20 0744 01/17/20 0758  BP: (!) 160/69     Pulse: 82     Resp: 17 16 18    Temp: 97.9 F (36.6 C)     TempSrc: Oral     SpO2: 96% 94% 94% 97%  Weight:      Height:       Physical Exam:  General: A/O x4, no acute respiratory distress Eyes: negative scleral hemorrhage, negative anisocoria, negative icterus ENT: Negative Runny nose, negative gingival bleeding, NG tube and right nare no longer suctioning bright red blood.  Still suctioning bilious fluid Neck:  Negative scars, masses, torticollis, lymphadenopathy, JVD Lungs: Clear to auscultation bilaterally without wheezes or crackles Cardiovascular: Regular rate and rhythm without murmur gallop or rub normal S1 and S2 Abdomen: Positive abdominal pain palpation appears to have hematoma forming around right inferior portion of wound VAC, negative soft, bowel sounds, no rebound, no ascites, no appreciable mass Extremities: No significant cyanosis, clubbing, or edema bilateral lower extremities Skin: Negative rashes, lesions, ulcers Psychiatric:  Negative depression, negative anxiety, negative fatigue,  negative mania  Central nervous system:  Cranial nerves II through XII intact, tongue/uvula midline, all extremities muscle strength 5/5, sensation intact throughout, negative dysarthria, negative expressive aphasia, negative receptive aphasia.    Data reviewed:  I have personally reviewed following labs and imaging studies Labs:  CBC: Recent Labs  Lab 01/13/20 2331 01/13/20 2331 01/14/20 0950 01/15/20 0800 01/16/20 0716 01/16/20 1910 01/17/20 0629  WBC 24.8*   < > 26.8* 15.8* 13.2* 14.8* 11.8*  NEUTROABS 20.1*  --   --  13.1* 10.8*  --  9.0*  HGB 15.6*   < > 14.4 10.7* 9.8* 10.0* 9.9*  HCT 44.4   < > 41.3 31.1* 29.4* 29.7* 29.7*  MCV 94.1   < > 95.2 96.9 98.7 97.1 96.1  PLT 355   < > 306 237 258 288 295   < > = values in this interval not displayed.    Basic Metabolic Panel: Recent Labs  Lab 01/13/20 2331 01/13/20 2331 01/14/20 0950 01/14/20 0950 01/15/20 0800 01/15/20 0800 01/16/20 0716 01/17/20 0629  NA 134*  --  132*  --  137  --  136 139  K 3.7   < > 3.6   < > 3.7   < > 3.5 2.7*  CL 94*  --  95*  --  100  --  100 95*  CO2 25  --  24  --  28  --  30 30  GLUCOSE 247*  --  233*  --  135*  --  109* 123*  BUN 25*  --  21  --  15  --  14 10  CREATININE 1.04*  --  0.83  --  0.51  --  0.54 0.54  CALCIUM 9.8  --  8.7*  --  7.8*  --  8.3* 8.3*  MG  --   --   --   --  1.7  --  2.0 1.6*  PHOS  --   --   --   --  2.4*  --  2.7 3.7   < > = values in this interval not displayed.   GFR Estimated Creatinine Clearance: 64.8 mL/min (by C-G formula based on SCr of 0.54 mg/dL). Liver Function Tests: Recent Labs  Lab 01/13/20 2331 01/14/20 0950 01/15/20 0800 01/16/20 0716 01/17/20 0629  AST 24 19 20  24 47*  ALT 19 17 14 17  36  ALKPHOS 64 54 35* 44 62  BILITOT 1.4* 1.0 1.1 1.0 1.4*  PROT 7.8 6.6 5.0* 5.5* 5.5*  ALBUMIN 4.3 3.2* 2.4* 2.6* 2.4*   Recent Labs  Lab 01/13/20 2331  LIPASE 23   No results for input(s): AMMONIA in the last 168 hours. Coagulation  profile Recent Labs  Lab 01/14/20 0950  INR 1.1    Cardiac Enzymes: No results for input(s): CKTOTAL, CKMB, CKMBINDEX, TROPONINI in the last 168 hours. BNP: Invalid input(s): POCBNP CBG: Recent Labs  Lab 01/16/20 1608 01/16/20 2027 01/17/20 0008 01/17/20 0422 01/17/20 0744  GLUCAP 111* 103* 116* 119* 113*   D-Dimer No results for input(s): DDIMER in the last 72 hours. Hgb A1c Recent Labs    01/14/20 0952  HGBA1C 6.8*   Lipid Profile No results for input(s): CHOL, HDL, LDLCALC, TRIG, CHOLHDL, LDLDIRECT in the last 72 hours. Thyroid function studies No results for input(s): TSH, T4TOTAL, T3FREE, THYROIDAB in the last 72 hours.  Invalid input(s): FREET3 Anemia work up No results for input(s): VITAMINB12, FOLATE, FERRITIN, TIBC, IRON, RETICCTPCT in the last 72 hours. Urinalysis    Component Value Date/Time   COLORURINE YELLOW 01/14/2020 0427   APPEARANCEUR CLEAR 01/14/2020 0427   LABSPEC 1.005 01/14/2020 0427   PHURINE 5.0 01/14/2020 0427   GLUCOSEU NEGATIVE 01/14/2020 0427   HGBUR NEGATIVE 01/14/2020 0427   BILIRUBINUR NEGATIVE 01/14/2020 0427   BILIRUBINUR neg 01/05/2020 1006   KETONESUR 5 (A) 01/14/2020 0427   PROTEINUR 30 (A) 01/14/2020 0427   UROBILINOGEN 0.2 01/05/2020 1006   UROBILINOGEN 0.2 11/20/2017 1049   NITRITE NEGATIVE 01/14/2020 0427   LEUKOCYTESUR NEGATIVE 01/14/2020 0427     Microbiology Recent Results (from the past 240 hour(s))  Blood Culture (routine x 2)     Status: None (Preliminary result)   Collection Time: 01/14/20 12:08 AM   Specimen: BLOOD RIGHT HAND  Result Value Ref Range Status   Specimen Description   Final    BLOOD RIGHT HAND Performed at [facility], [place] [address], [place], [place] [address]    Special Requests   Final    BOTTLES DRAWN AEROBIC AND ANAEROBIC Blood Culture adequate volume Performed at [place] [address] [place], [place] [address]    Culture   Final     NO GROWTH 3 DAYS Performed at [facility], [place] [address], [place], [place] [address]    Report Status PENDING  Incomplete  Blood Culture (routine x 2)     Status: None (Preliminary result)   Collection Time: 01/14/20 12:57 AM   Specimen: BLOOD  Result Value Ref Range Status   Specimen Description   Final    BLOOD RIGHT ANTECUBITAL Performed at [place] [address], [place], [place] [address]    Special Requests   Final    BOTTLES DRAWN AEROBIC AND ANAEROBIC Blood Culture adequate volume Performed at [place] [address], [place], [place] [address]    Culture   Final    NO GROWTH 3 DAYS Performed at [facility], [place] [address] [place], [place] [address]    Report Status PENDING  Incomplete  SARS Coronavirus 2 by RT PCR (hospital order, performed in [facility] hospital lab) Nasopharyngeal Nasopharyngeal Swab     Status: None   Collection Time: 01/14/20  3:23 AM   Specimen: Nasopharyngeal Swab  Result Value Ref Range Status  SARS Coronavirus 2 NEGATIVE NEGATIVE Final    Comment: (NOTE) SARS-CoV-2 target nucleic acids are NOT DETECTED. The SARS-CoV-2 RNA is generally detectable in upper and lower respiratory specimens during the acute phase of infection. The lowest concentration of SARS-CoV-2 viral copies this assay can detect is 250 copies / mL. A negative result does not preclude SARS-CoV-2 infection and should not be used as the sole basis for treatment or other patient management decisions.  A negative result may occur with improper specimen collection / handling, submission of specimen other than nasopharyngeal swab, presence of viral mutation(s) within the areas targeted by this assay, and inadequate number of viral copies (<250 copies / mL). A negative result must be combined with clinical observations, patient history, and epidemiological information. Fact Sheet for Patients:    StrictlyIdeas.no Fact Sheet for Healthcare Providers: BankingDealers.co.za This test is not yet approved or cleared  by the [place] FDA and has been authorized for detection and/or diagnosis of SARS-CoV-2 by FDA under an Emergency Use Authorization (EUA).  This EUA will remain in effect (meaning this test can be used) for the duration of the COVID-19 declaration under Section 564(b)(1) of the Act, 21 U.S.C. section 360bbb-3(b)(1), unless the authorization is terminated or revoked sooner. Performed at [facility], [place] [address] [place], [place] [address]        Inpatient Medications:   Scheduled Meds: . Chlorhexidine Gluconate Cloth  6 each Topical Daily  . dextromethorphan-guaiFENesin  1 tablet Oral BID  . enoxaparin (LOVENOX) injection  40 mg Subcutaneous Q24H  . HYDROmorphone   Intravenous Q4H  . insulin aspart  0-15 Units Subcutaneous Q4H  . ipratropium-albuterol  3 mL Nebulization TID  . mouth rinse  15 mL Mouth Rinse BID  . metoprolol tartrate  5 mg Intravenous Q6H  . pantoprazole (PROTONIX) IV  40 mg Intravenous QHS   Continuous Infusions: . chlorproMAZINE (THORAZINE) IV    . lactated ringers 10 mL/hr at 01/15/20 2254  . lactated ringers 125 mL/hr at 01/15/20 1415  . methocarbamol (ROBAXIN) IV 500 mg (01/17/20 0426)  . piperacillin-tazobactam (ZOSYN)  IV 3.375 g (01/17/20 0051)     Radiological Exams on Admission: No results found.  Impression/Recommendations Principal Problem:   Cecal volvulus s/p ileocolectomy 01/14/2020 Active Problems:   Essential hypertension   Hyperlipidemia LDL goal <100   History of hepatitis C   Pain of left hip joint   Peripheral arterial disease ([place])   Long term current use of anticoagulant therapy   Nerve damage   GERD (gastroesophageal reflux disease)   Blood transfusion without reported diagnosis   Anxiety   Bowel obstruction (HCC)   Sigmoid  volvulus (HCC)  Essential HTN -5/15 Metoprolol IV 5 mg QID hold for MAP<65 -5/16 furosemide 60 mg x 1 -5/16 Hydralazine 7 mg  PRN -Hold all other BP medication at this time patient just s/p abdominal surgery  PAD -All home meds on hold secondary to abdominal surgery  HLD -All home meds on hold secondary to abdominal surgery  Pain regimen -PCA pump per surgery  Anxiety -Ativan 1 to 2 mg PRN   Tobacco abuse -Nicotine patch PRN  Sigmoid volvulus/Bowel obstruction -5/13 s/p ex lap see above -5/15 RN instructed to discontinue NG suction secondary to frank blood being suctioned, increasing abdominal pain.  Surgery paged to bedside  Refractory nausea -Zofran PRN -Thorazine PRN if Zofran fails to alleviate nausea  Hypokalemia -Potassium goal> 4 -Potassium IV 60 mEq  Hypomagnesmia -Magnesium goal> 2 -Magnesium  3 g     Thank you for this consultation.  Our [facility] hospitalist team will follow the patient with you.   Time Spent: 35 minutes  Elania Crowl, Geraldo Docker M.D. [facility] 01/17/2020, 8:35 AM  [id number]

## 2020-01-17 NOTE — Progress Notes (Signed)
Dr Lucia Gaskins assessed pt midline wound and NGT at bedside. RN restarted LIWS to NGT and wound vac left alone and RN will continue to monitor.

## 2020-01-18 LAB — CBC WITH DIFFERENTIAL/PLATELET
Abs Immature Granulocytes: 0.09 10*3/uL — ABNORMAL HIGH (ref 0.00–0.07)
Basophils Absolute: 0.1 10*3/uL (ref 0.0–0.1)
Basophils Relative: 1 %
Eosinophils Absolute: 0.3 10*3/uL (ref 0.0–0.5)
Eosinophils Relative: 3 %
HCT: 31.8 % — ABNORMAL LOW (ref 36.0–46.0)
Hemoglobin: 11 g/dL — ABNORMAL LOW (ref 12.0–15.0)
Immature Granulocytes: 1 %
Lymphocytes Relative: 16 %
Lymphs Abs: 1.6 10*3/uL (ref 0.7–4.0)
MCH: 32.6 pg (ref 26.0–34.0)
MCHC: 34.6 g/dL (ref 30.0–36.0)
MCV: 94.4 fL (ref 80.0–100.0)
Monocytes Absolute: 1.6 10*3/uL — ABNORMAL HIGH (ref 0.1–1.0)
Monocytes Relative: 16 %
Neutro Abs: 6.6 10*3/uL (ref 1.7–7.7)
Neutrophils Relative %: 63 %
Platelets: 321 10*3/uL (ref 150–400)
RBC: 3.37 MIL/uL — ABNORMAL LOW (ref 3.87–5.11)
RDW: 12.3 % (ref 11.5–15.5)
WBC: 10.2 10*3/uL (ref 4.0–10.5)
nRBC: 0 % (ref 0.0–0.2)

## 2020-01-18 LAB — PHOSPHORUS: Phosphorus: 3.5 mg/dL (ref 2.5–4.6)

## 2020-01-18 LAB — MAGNESIUM
Magnesium: 1.9 mg/dL (ref 1.7–2.4)
Magnesium: 1.9 mg/dL (ref 1.7–2.4)

## 2020-01-18 LAB — POTASSIUM: Potassium: 3.6 mmol/L (ref 3.5–5.1)

## 2020-01-18 LAB — GLUCOSE, CAPILLARY
Glucose-Capillary: 103 mg/dL — ABNORMAL HIGH (ref 70–99)
Glucose-Capillary: 123 mg/dL — ABNORMAL HIGH (ref 70–99)
Glucose-Capillary: 123 mg/dL — ABNORMAL HIGH (ref 70–99)
Glucose-Capillary: 139 mg/dL — ABNORMAL HIGH (ref 70–99)
Glucose-Capillary: 88 mg/dL (ref 70–99)
Glucose-Capillary: 98 mg/dL (ref 70–99)

## 2020-01-18 LAB — COMPREHENSIVE METABOLIC PANEL
ALT: 45 U/L — ABNORMAL HIGH (ref 0–44)
AST: 62 U/L — ABNORMAL HIGH (ref 15–41)
Albumin: 2.5 g/dL — ABNORMAL LOW (ref 3.5–5.0)
Alkaline Phosphatase: 83 U/L (ref 38–126)
Anion gap: 11 (ref 5–15)
BUN: 11 mg/dL (ref 8–23)
CO2: 33 mmol/L — ABNORMAL HIGH (ref 22–32)
Calcium: 8.3 mg/dL — ABNORMAL LOW (ref 8.9–10.3)
Chloride: 94 mmol/L — ABNORMAL LOW (ref 98–111)
Creatinine, Ser: 0.49 mg/dL (ref 0.44–1.00)
GFR calc Af Amer: >60 mL/min (ref >60)
GFR calc non Af Amer: >60 mL/min (ref >60)
Glucose, Bld: 110 mg/dL — ABNORMAL HIGH (ref 70–99)
Potassium: 2.9 mmol/L — ABNORMAL LOW (ref 3.5–5.1)
Sodium: 138 mmol/L (ref 135–145)
Total Bilirubin: 1.5 mg/dL — ABNORMAL HIGH (ref 0.3–1.2)
Total Protein: 5.6 g/dL — ABNORMAL LOW (ref 6.5–8.1)

## 2020-01-18 MED ORDER — CHLORHEXIDINE GLUCONATE 0.12 % MT SOLN
15.0000 mL | Freq: Two times a day (BID) | OROMUCOSAL | Status: DC
  Administered 2020-01-18 – 2020-01-22 (×6): 15 mL via OROMUCOSAL
  Filled 2020-01-18 (×6): qty 15

## 2020-01-18 MED ORDER — MAGNESIUM SULFATE IN D5W 1-5 GM/100ML-% IV SOLN
1.0000 g | Freq: Once | INTRAVENOUS | Status: AC
  Administered 2020-01-18: 1 g via INTRAVENOUS
  Filled 2020-01-18: qty 100

## 2020-01-18 MED ORDER — BISACODYL 10 MG RE SUPP
10.0000 mg | Freq: Once | RECTAL | Status: AC
  Administered 2020-01-18: 10 mg via RECTAL
  Filled 2020-01-18: qty 1

## 2020-01-18 MED ORDER — POTASSIUM CHLORIDE 10 MEQ/100ML IV SOLN
10.0000 meq | INTRAVENOUS | Status: AC
  Administered 2020-01-18 (×6): 10 meq via INTRAVENOUS
  Filled 2020-01-18 (×6): qty 100

## 2020-01-18 MED ORDER — IPRATROPIUM-ALBUTEROL 0.5-2.5 (3) MG/3ML IN SOLN
3.0000 mL | Freq: Two times a day (BID) | RESPIRATORY_TRACT | Status: DC
  Administered 2020-01-18 – 2020-01-20 (×4): 3 mL via RESPIRATORY_TRACT
  Filled 2020-01-18 (×4): qty 3

## 2020-01-18 MED ORDER — ORAL CARE MOUTH RINSE
15.0000 mL | Freq: Two times a day (BID) | OROMUCOSAL | Status: DC
  Administered 2020-01-18 – 2020-01-22 (×5): 15 mL via OROMUCOSAL

## 2020-01-18 MED ORDER — POTASSIUM CHLORIDE 10 MEQ/100ML IV SOLN
10.0000 meq | INTRAVENOUS | Status: AC
  Administered 2020-01-18 – 2020-01-19 (×5): 10 meq via INTRAVENOUS
  Filled 2020-01-18 (×3): qty 100

## 2020-01-18 NOTE — Consult Note (Signed)
[facility] Nurse wound follow up Wound type:Surgical Measurement:14.5cm x 4.5cm x 4cm  Wound [id number] red. moist Drainage (amount, consistency, odor) serosanguinous Periwound: intact. Mild maceration at umbilicus (2 o'clock) Dressing procedure/placement/frequency: One piece of black foam removed from wound, wound cleansed with NS, patted dry.  Pieces of skin barrier ring are used to enhance seal and protect periwound tissue at most distal aspect of wound and at umbilicus.  One piece of black foam used to obliterate dead space and this is secured with drape. Dressing is attached to 127mmHg continuous negative pressure and an immediate seal is achieved.  Dressing is labeled. Patient was premedicated with 1mg  Dilaudid prior to the dressing change and tolerated the procedure well.  Next dressing change is due on Wednesday, May 19. One of my partners will perform in my absence.   [facility] nursing team will follow, and will remain available to this patient, the nursing and medical teams.   Thanks, Maudie Flakes, MSN, RN, [place], Arther Abbott  Pager# [phone]

## 2020-01-18 NOTE — Consult Note (Signed)
Dear Doctor:  This patient has been identified as a candidate for PICC for the following reason (s): IV therapy over 48 hours, drug pH or osmolality (causing phlebitis, infiltration in 24 hours) and poor veins/poor circulatory system (CHF, COPD, emphysema, diabetes, steroid use, IV drug abuse, etc.) If you agree, please write an order for the indicated device.  Thank you for supporting the early vascular access assessment program.

## 2020-01-18 NOTE — Care Management Important Message (Signed)
Important Message  Patient Details IM Letter given to Roque Lias SW Case Manager to present to the Patient Name: MEHA GOON MRN: [id number] Date of Birth: 12/13/1950   Medicare Important Message Given:  Yes     Kerin Salen 01/18/2020, 11:05 AM

## 2020-01-18 NOTE — Consult Note (Signed)
Medical Consultation   Margaret Kelly  [id number]  DOB: 1951-02-14  DOA: 01/13/2020  PCP: Dorena Dew, FNP    Requesting physician: Dr. Michael Boston ([facility])  Reason for consultation: Multiple medical problems   History of Present Illness:  69 year old WF PMHx anxiety, HTN, PAD, HLD, nerve damage left side secondary to fall, LEFT hip pain, Hx hepatitis C. tobacco abuse   Woman, patient of Dr. Lucio Edward with [facility], who presented to the ED overnight with severe generalized abdominal pain and inability to move her bowels or expel gas for the last few days.  She had been suffering from constipation on a regular basis, had a colonoscopy with Dr. Fuller Plan in March of this year.  Over the last several days she was having increasing abdominal pain constipation and bloating with several calls to the office.  Recommendations were given, including advice to come to the emergency department on May 10 when the patient reported being in agony.  She then delayed coming to the ED until last night.  ED provider notes indicate the patient was in severe pain, lab review notes white count of 25,000.  CT scan shows sigmoid volvulus at about 3:00 this morning.  A call was placed to the [facility] GI group, Dr. Alexis Frock, as they were the group on for the [facility] long ED.  Dr. Therisa Doyne apparently spoke to the endoscopy team and put the patient on for 7:30 AM sigmoidoscopy with one of her partners.  I received a message from Dr. Therisa Doyne about an hour ago stating that once they determined this was a patient of [facility], the case would then be given to me.  I immediately spoke to our endoscopy team, asked him to bring the patient over from the ED, and I came immediately to [facility] long hospital.  This patient is in agony right now and cannot give much else in the way of history or review of systems.  She says the pain has been steadily escalating. At the time my evaluation, the surgical PA is  also evaluating her.   5/14 afebrile overnight, A/O x4, negative S OB.  Positive abdominal pain especially when she coughs.  Nausea controlled on current regimen.  Abdominal pain not controlled on current regimen  5/15 afebrile overnight A/O x4, negative S OB.  States overnight increased abdominal pain so started on PCA pump.  Negative nausea.  ADDENDUM; NG tube began to suction frank blood, exam him wound VAC was occluded and blood pooling.  5/16 A/O x4 patient states abdominal pain has decreased, surgery came by last night as well patient restarted suction on NG tube today no further bright red blood per suction tube.  Patient states nausea improved and much more comfortable.  5/17 A/O x4, positive abdominal pain but significantly decreased since she had BM today.,  Negative SOB, negative CP.    Review of Systems:  Review of Systems  Constitutional: Negative.   HENT: Positive for sore throat.   Eyes: Negative.   Respiratory: Positive for cough and sputum production.   Cardiovascular: Negative.   Gastrointestinal: Positive for abdominal pain and nausea. Negative for diarrhea and vomiting.  Genitourinary: Negative.   Musculoskeletal: Negative.   Skin: Negative.   Endo/Heme/Allergies: Negative.   Psychiatric/Behavioral: Negative for depression, hallucinations, memory loss, substance abuse and suicidal ideas. The patient is nervous/anxious. The patient does not have insomnia.  Past Medical History: Past Medical History:  Diagnosis Date  . Anxiety   . Blood transfusion without reported diagnosis 1982  . GERD (gastroesophageal reflux disease)   . History of hepatitis C 10/13/2015  . Hyperlipidemia LDL goal <100 10/13/2015  . Hypertension   . Muscle spasm of left lower extremity 10/13/2015  . Nerve damage 2001   left side as result of fall  . PAD (peripheral artery disease) ([place])   . Pain of left hip joint 02/21/2018  . Plantar wart     Past Surgical History: Past Surgical  History:  Procedure Laterality Date  . ABDOMINAL AORTOGRAM W/LOWER EXTREMITY Bilateral 03/26/2019   Procedure: ABDOMINAL AORTOGRAM W/LOWER EXTREMITY;  Surgeon: Lorretta Harp, MD;  Location: [facility];  Service: Cardiovascular;  Laterality: Bilateral;  . ABDOMINAL AORTOGRAM W/LOWER EXTREMITY Bilateral 08/03/2019   Procedure: ABDOMINAL AORTOGRAM W/LOWER EXTREMITY;  Surgeon: Lorretta Harp, MD;  Location: [facility];  Service: Cardiovascular;  Laterality: Bilateral;  . ABDOMINAL HYSTERECTOMY  1982  . CARPAL TUNNEL RELEASE Left 2005  . foot spur Left 2007   foot  . LAPAROTOMY N/A 01/14/2020   Procedure: OPEN RIGHT COLECTOMY, EXPLORATORY LAPAROTOMY;  Surgeon: Michael Boston, MD;  Location: [facility];  Service: General;  Laterality: N/A;  . PERIPHERAL VASCULAR BALLOON ANGIOPLASTY Left 08/03/2019   Procedure: PERIPHERAL VASCULAR BALLOON ANGIOPLASTY;  Surgeon: Lorretta Harp, MD;  Location: [facility];  Service: Cardiovascular;  Laterality: Left;  SFA/ popliteal     Allergies:   Allergies  Allergen Reactions  . Amlodipine Other (See Comments)    Ringing in ears     Social History:  reports that she has been smoking cigarettes. She has been smoking about 0.50 packs per day. She has quit using smokeless tobacco. She reports current alcohol use. She reports that she does not use drugs.   Family History: Family History  Problem Relation Age of Onset  . Colon cancer Neg Hx   . Esophageal cancer Neg Hx   . Rectal cancer Neg Hx   . Stomach cancer Neg Hx      Procedures/Significant Events:  5/13;EXPLORATORY LAPAROTOMY, ILEOCOLECTOMY, TAP BLOCK - BILATERAL    I have personally reviewed and interpreted all radiology studies and my findings are as above.  VENTILATOR SETTINGS: Nasal cannula 5/17 Flow 2 L/min SPO2 96%    Cultures   Antimicrobials: Anti-infectives (From admission, onward)   Start     Dose/Rate Stop   01/14/20 1730   piperacillin-tazobactam (ZOSYN) IVPB 3.375 g     3.375 g 12.5 mL/hr over 240 Minutes 01/19/20 1359   01/14/20 1001  sodium chloride 0.9 % with cefoTEtan (CEFOTAN) ADS Med    Note to Pharmacy: Jefm Miles   : cabinet override    01/14/20 1059   01/14/20 0945  cefoTEtan (CEFOTAN) 2 g in sodium chloride 0.9 % 100 mL IVPB     2 g 200 mL/hr over 30 Minutes 01/14/20 1040   01/14/20 0015  cefTRIAXone (ROCEPHIN) 2 g in sodium chloride 0.9 % 100 mL IVPB     2 g 200 mL/hr over 30 Minutes 01/14/20 0158   01/14/20 0015  metroNIDAZOLE (FLAGYL) IVPB 500 mg     500 mg 100 mL/hr over 60 Minutes 01/14/20 0243       Devices    LINES / TUBES:      Continuous Infusions: . chlorproMAZINE (THORAZINE) IV    . lactated ringers 125 mL/hr at 01/18/20 1057  . methocarbamol (  ROBAXIN) IV 500 mg (01/17/20 2358)  . piperacillin-tazobactam (ZOSYN)  IV 3.375 g (01/18/20 0855)  . potassium chloride 10 mEq (01/18/20 1157)     Physical Exam: Vitals:   01/17/20 2015 01/17/20 2107 01/18/20 0416 01/18/20 0801  BP: (!) 165/70  138/71   Pulse: 84  76   Resp: 19  20   Temp: 98 F (36.7 C)  98.3 F (36.8 C)   TempSrc: Oral  Oral   SpO2: 96% 94% 98% 96%  Weight:      Height:       Physical Exam:  General: A/O x4, no acute respiratory distress Eyes: negative scleral hemorrhage, negative anisocoria, negative icterus ENT: Negative Runny nose, negative gingival bleeding, Neck:  Negative scars, masses, torticollis, lymphadenopathy, JVD Lungs: Clear to auscultation bilaterally without wheezes or crackles Cardiovascular: Regular rate and rhythm without murmur gallop or rub normal S1 and S2 Abdomen: Positive abdominal pain (decreasing), nondistended, positive soft, bowel sounds, no rebound, no ascites, no appreciable mass, wound VAC in place negative drainage. Extremities: No significant cyanosis, clubbing, or edema bilateral lower extremities Skin: Negative rashes, lesions, ulcers Psychiatric:  Negative  depression, negative anxiety, negative fatigue, negative mania  Central nervous system:  Cranial nerves II through XII intact, tongue/uvula midline, all extremities muscle strength 5/5, sensation intact throughout, negative dysarthria, negative expressive aphasia, negative receptive aphasia.   Data reviewed:  I have personally reviewed following labs and imaging studies Labs:  CBC: Recent Labs  Lab 01/13/20 2331 01/14/20 0950 01/15/20 0800 01/16/20 0716 01/16/20 1910 01/17/20 0629 01/18/20 0454  WBC 24.8*   < > 15.8* 13.2* 14.8* 11.8* 10.2  NEUTROABS 20.1*  --  13.1* 10.8*  --  9.0* 6.6  HGB 15.6*   < > 10.7* 9.8* 10.0* 9.9* 11.0*  HCT 44.4   < > 31.1* 29.4* 29.7* 29.7* 31.8*  MCV 94.1   < > 96.9 98.7 97.1 96.1 94.4  PLT 355   < > 237 258 288 295 321   < > = values in this interval not displayed.    Basic Metabolic Panel: Recent Labs  Lab 01/14/20 0950 01/14/20 0950 01/15/20 0800 01/15/20 0800 01/16/20 0716 01/16/20 0716 01/17/20 0629 01/18/20 0454  NA 132*  --  137  --  136  --  139 138  K 3.6   < > 3.7   < > 3.5   < > 2.7* 2.9*  CL 95*  --  100  --  100  --  95* 94*  CO2 24  --  28  --  30  --  30 33*  GLUCOSE 233*  --  135*  --  109*  --  123* 110*  BUN 21  --  15  --  14  --  10 11  CREATININE 0.83  --  0.51  --  0.54  --  0.54 0.49  CALCIUM 8.7*  --  7.8*  --  8.3*  --  8.3* 8.3*  MG  --   --  1.7  --  2.0  --  1.6* 1.9  PHOS  --   --  2.4*  --  2.7  --  3.7 3.5   < > = values in this interval not displayed.   GFR Estimated Creatinine Clearance: 64.8 mL/min (by C-G formula based on SCr of 0.49 mg/dL). Liver Function Tests: Recent Labs  Lab 01/14/20 0950 01/15/20 0800 01/16/20 0716 01/17/20 0629 01/18/20 0454  AST 19 20 24  47* 62*  ALT 17 14  17 36 45*  ALKPHOS 54 35* 44 62 83  BILITOT 1.0 1.1 1.0 1.4* 1.5*  PROT 6.6 5.0* 5.5* 5.5* 5.6*  ALBUMIN 3.2* 2.4* 2.6* 2.4* 2.5*   Recent Labs  Lab 01/13/20 2331  LIPASE 23   No results for input(s):  AMMONIA in the last 168 hours. Coagulation profile Recent Labs  Lab 01/14/20 0950  INR 1.1    Cardiac Enzymes: No results for input(s): CKTOTAL, CKMB, CKMBINDEX, TROPONINI in the last 168 hours. BNP: Invalid input(s): POCBNP CBG: Recent Labs  Lab 01/17/20 2017 01/18/20 0039 01/18/20 0413 01/18/20 0743 01/18/20 1133  GLUCAP 156* 123* 103* 98 123*   D-Dimer No results for input(s): DDIMER in the last 72 hours. Hgb A1c No results for input(s): HGBA1C in the last 72 hours. Lipid Profile No results for input(s): CHOL, HDL, LDLCALC, TRIG, CHOLHDL, LDLDIRECT in the last 72 hours. Thyroid function studies No results for input(s): TSH, T4TOTAL, T3FREE, THYROIDAB in the last 72 hours.  Invalid input(s): FREET3 Anemia work up No results for input(s): VITAMINB12, FOLATE, FERRITIN, TIBC, IRON, RETICCTPCT in the last 72 hours. Urinalysis    Component Value Date/Time   COLORURINE YELLOW 01/14/2020 0427   APPEARANCEUR CLEAR 01/14/2020 0427   LABSPEC 1.005 01/14/2020 0427   PHURINE 5.0 01/14/2020 0427   GLUCOSEU NEGATIVE 01/14/2020 0427   HGBUR NEGATIVE 01/14/2020 0427   BILIRUBINUR NEGATIVE 01/14/2020 0427   BILIRUBINUR neg 01/05/2020 1006   KETONESUR 5 (A) 01/14/2020 0427   PROTEINUR 30 (A) 01/14/2020 0427   UROBILINOGEN 0.2 01/05/2020 1006   UROBILINOGEN 0.2 11/20/2017 1049   NITRITE NEGATIVE 01/14/2020 0427   LEUKOCYTESUR NEGATIVE 01/14/2020 0427     Microbiology Recent Results (from the past 240 hour(s))  Blood Culture (routine x 2)     Status: None (Preliminary result)   Collection Time: 01/14/20 12:08 AM   Specimen: BLOOD RIGHT HAND  Result Value Ref Range Status   Specimen Description   Final    BLOOD RIGHT HAND Performed at [facility], [place] [address], [place], [place] [address]    Special Requests   Final    BOTTLES DRAWN AEROBIC AND ANAEROBIC Blood Culture adequate volume Performed at [facility] [address] [place], [place] [address]    Culture   Final    NO GROWTH 4 DAYS Performed at [facility], [place] [address], [place], [place] [address]    Report Status PENDING  Incomplete  Blood Culture (routine x 2)     Status: None (Preliminary result)   Collection Time: 01/14/20 12:57 AM   Specimen: BLOOD  Result Value Ref Range Status   Specimen Description   Final    BLOOD RIGHT ANTECUBITAL Performed at [facility] [address] [place], [place] [address]    Special Requests   Final    BOTTLES DRAWN AEROBIC AND ANAEROBIC Blood Culture adequate volume Performed at [facility] [address], [place], [place] [address]    Culture   Final    NO GROWTH 4 DAYS Performed at [facility], [place] [address] [place],  [address]    Report Status PENDING  Incomplete  SARS Coronavirus 2 by RT PCR (hospital order, performed in [facility] hospital lab) Nasopharyngeal Nasopharyngeal Swab     Status: None   Collection Time: 01/14/20  3:23 AM   Specimen: Nasopharyngeal Swab  Result Value Ref Range Status   SARS Coronavirus 2 NEGATIVE NEGATIVE Final  Comment: (NOTE) SARS-CoV-2 target nucleic acids are NOT DETECTED. The SARS-CoV-2 RNA is generally detectable in upper and lower respiratory specimens during the acute phase of infection. The lowest concentration of SARS-CoV-2 viral copies this assay can detect is 250 copies / mL. A negative result does not preclude SARS-CoV-2 infection and should not be used as the sole basis for treatment or other patient management decisions.  A negative result may occur with improper specimen collection / handling, submission of specimen other than nasopharyngeal swab, presence of viral mutation(s) within the areas targeted by this assay, and inadequate number of viral copies (<250 copies / mL). A negative result must be combined with clinical observations, patient history, and epidemiological  information. Fact Sheet for Patients:   StrictlyIdeas.no Fact Sheet for Healthcare Providers: BankingDealers.co.za This test is not yet approved or cleared  by the [place] FDA and has been authorized for detection and/or diagnosis of SARS-CoV-2 by FDA under an Emergency Use Authorization (EUA).  This EUA will remain in effect (meaning this test can be used) for the duration of the COVID-19 declaration under Section 564(b)(1) of the Act, 21 U.S.C. section 360bbb-3(b)(1), unless the authorization is terminated or revoked sooner. Performed at [facility], [place] [address] [place], [place] [address]        Inpatient Medications:   Scheduled Meds: . chlorhexidine  15 mL Mouth Rinse BID  . dextromethorphan-guaiFENesin  1 tablet Oral BID  . enoxaparin (LOVENOX) injection  40 mg Subcutaneous Q24H  . insulin aspart  0-15 Units Subcutaneous Q4H  . ipratropium-albuterol  3 mL Nebulization BID  . mouth rinse  15 mL Mouth Rinse BID  . mouth rinse  15 mL Mouth Rinse q12n4p  . metoprolol tartrate  5 mg Intravenous Q6H  . pantoprazole (PROTONIX) IV  40 mg Intravenous QHS   Continuous Infusions: . chlorproMAZINE (THORAZINE) IV    . lactated ringers 125 mL/hr at 01/18/20 1057  . methocarbamol (ROBAXIN) IV 500 mg (01/17/20 2358)  . piperacillin-tazobactam (ZOSYN)  IV 3.375 g (01/18/20 0855)  . potassium chloride 10 mEq (01/18/20 1157)     Radiological Exams on Admission: No results found.  Impression/Recommendations Principal Problem:   Cecal volvulus s/p ileocolectomy 01/14/2020 Active Problems:   Essential hypertension   Hyperlipidemia LDL goal <100   History of hepatitis C   Pain of left hip joint   Peripheral arterial disease ([place])   Long term current use of anticoagulant therapy   Nerve damage   GERD (gastroesophageal reflux disease)   Blood transfusion without reported diagnosis   Anxiety   Bowel  obstruction ([facility])   Sigmoid volvulus ([facility])  Essential HTN -5/15 Metoprolol IV 5 mg QID hold for MAP<65 -5/16 furosemide 60 mg x 1 -5/16 Hydralazine 7 mg  PRN -Hold all other BP medication at this time patient just s/p abdominal surgery  PAD -All home meds on hold secondary to abdominal surgery  HLD -All home meds on hold secondary to abdominal surgery  Pain regimen -PCA pump per surgery  Anxiety -Ativan 1 to 2 mg PRN   Tobacco abuse -Nicotine patch PRN  Sigmoid volvulus/Bowel obstruction -5/13 s/p ex lap see above -5/15 RN instructed to discontinue NG suction secondary to frank blood being suctioned, increasing abdominal pain.  Surgery paged to bedside  Refractory nausea -Zofran PRN -Thorazine PRN if Zofran fails to alleviate nausea  Hypokalemia -Potassium goal> 4 -Potassium IV 60 mEq -Recheck K/Mg @1530  potassium still low.  Repeat potassium IV 50 mEq  Hypomagnesmia -  Magnesium goal> 2 -Magnesium 3 g -See hypokalemia     Thank you for this consultation.  Our [facility] hospitalist team will follow the patient with you.   Time Spent: 35 minutes  Abdullah Rizzi, Geraldo Docker M.D. [facility] 01/18/2020, 12:54 PM  [id number]

## 2020-01-18 NOTE — Progress Notes (Addendum)
[facility] Progress Note  4 Days Post-Op  Subjective: Patient reports abdominal pain, mostly with coughing. She is not passing any flatus or stool. She does not think NGT was clamped overnight but is unsure. She is having bilious output from NGT. NGT is irritating to throat. She has not gotten up much, refused suppository yesterday because foley removal caused a lot of discomfort but more willing to try today.   Objective: Vital signs in last 24 hours: Temp:  [98 F (36.7 C)-98.3 F (36.8 C)] 98.3 F (36.8 C) (05/17 0416) Pulse Rate:  [76-84] 76 (05/17 0416) Resp:  [19-20] 20 (05/17 0416) BP: (138-183)/(70-84) 138/71 (05/17 0416) SpO2:  [94 %-99 %] 96 % (05/17 0801)    Intake/Output from previous day: 05/16 0701 - 05/17 0700 In: 200 [IV Piggyback:200] Out: 1700 [Urine:1650; Emesis/NG output:50] Intake/Output this shift: No intake/output data recorded.  PE: General: pleasant, WD, WN female who is laying in bed in NAD Heart: regular, rate, and rhythm.  Normal s1,s2. No obvious murmurs, gallops, or rubs noted.  Palpable radial and pedal pulses bilaterally Lungs: CTAB, no wheezes, rhonchi, or rales noted.  Respiratory effort nonlabored Abd: soft, NT, ND, some BS but hypoactive, VAC to midline MS: all 4 extremities are symmetrical with no cyanosis, clubbing, or edema. Skin: warm and dry with no masses, lesions, or rashes   Lab Results:  Recent Labs    01/17/20 0629 01/18/20 0454  WBC 11.8* 10.2  HGB 9.9* 11.0*  HCT 29.7* 31.8*  PLT 295 321   BMET Recent Labs    01/17/20 0629 01/18/20 0454  NA 139 138  K 2.7* 2.9*  CL 95* 94*  CO2 30 33*  GLUCOSE 123* 110*  BUN 10 11  CREATININE 0.54 0.49  CALCIUM 8.3* 8.3*   PT/INR No results for input(s): LABPROT, INR in the last 72 hours. CMP     Component Value Date/Time   NA 138 01/18/2020 0454   NA 141 01/05/2020 1043   K 2.9 (L) 01/18/2020 0454   CL 94 (L) 01/18/2020 0454   CO2 33 (H) 01/18/2020 0454    GLUCOSE 110 (H) 01/18/2020 0454   BUN 11 01/18/2020 0454   BUN 14 01/05/2020 1043   CREATININE 0.49 01/18/2020 0454   CREATININE 0.78 03/22/2017 1116   CALCIUM 8.3 (L) 01/18/2020 0454   PROT 5.6 (L) 01/18/2020 0454   PROT 7.2 01/05/2020 1043   ALBUMIN 2.5 (L) 01/18/2020 0454   ALBUMIN 4.7 01/05/2020 1043   AST 62 (H) 01/18/2020 0454   ALT 45 (H) 01/18/2020 0454   ALKPHOS 83 01/18/2020 0454   BILITOT 1.5 (H) 01/18/2020 0454   BILITOT 0.4 01/05/2020 1043   GFRNONAA >60 01/18/2020 0454   GFRNONAA 81 03/22/2017 1116   GFRAA >60 01/18/2020 0454   GFRAA >89 03/22/2017 1116   Lipase     Component Value Date/Time   LIPASE 23 01/13/2020 2331       Studies/Results: No results found.  Anti-infectives: Anti-infectives (From admission, onward)   Start     Dose/Rate Route Frequency Ordered Stop   01/14/20 1730  piperacillin-tazobactam (ZOSYN) IVPB 3.375 g     3.375 g 12.5 mL/hr over 240 Minutes Intravenous Every 8 hours 01/14/20 1723 01/19/20 1559   01/14/20 1001  sodium chloride 0.9 % with cefoTEtan (CEFOTAN) ADS Med    Note to Pharmacy: Jefm Miles   : cabinet override      01/14/20 1001 01/14/20 1059   01/14/20 0945  cefoTEtan (CEFOTAN) 2  g in sodium chloride 0.9 % 100 mL IVPB     2 g 200 mL/hr over 30 Minutes Intravenous On call to O.R. 01/14/20 0941 01/14/20 1040   01/14/20 0015  cefTRIAXone (ROCEPHIN) 2 g in sodium chloride 0.9 % 100 mL IVPB     2 g 200 mL/hr over 30 Minutes Intravenous  Once 01/14/20 0008 01/14/20 0158   01/14/20 0015  metroNIDAZOLE (FLAGYL) IVPB 500 mg     500 mg 100 mL/hr over 60 Minutes Intravenous  Once 01/14/20 0008 01/14/20 0243       Assessment/Plan HTN PAD HLD Anxiety  Tobacco abuse  Cecal volvulus  S/P exploratory laparotomy with ileocecectomy 01/14/20 Dr. Johney Maine - POD#4 - NGT with bilious drainage but low output - try clamping and sips today - if not opening up by tomorrow will need CT and likely TPN - try suppository  -  mobilize!!! - continue VAC m/w/f  FEN: clamp NGT, trial sips; replace K to goal of 4.0 and Mg to goal of 2.0  VTE: SCDs, lovenox ID: [email or web address] to stop tomorrow   LOS: 4 days    Norm Parcel , [facility] 01/18/2020, 8:31 AM Please see Amion for pager number during day hours 7:00am-4:30pm

## 2020-01-19 LAB — CULTURE, BLOOD (ROUTINE X 2)
Culture: NO GROWTH
Culture: NO GROWTH
Special Requests: ADEQUATE
Special Requests: ADEQUATE

## 2020-01-19 LAB — CBC WITH DIFFERENTIAL/PLATELET
Abs Immature Granulocytes: 0.14 10*3/uL — ABNORMAL HIGH (ref 0.00–0.07)
Basophils Absolute: 0.1 10*3/uL (ref 0.0–0.1)
Basophils Relative: 1 %
Eosinophils Absolute: 0.5 10*3/uL (ref 0.0–0.5)
Eosinophils Relative: 5 %
HCT: 32.4 % — ABNORMAL LOW (ref 36.0–46.0)
Hemoglobin: 10.9 g/dL — ABNORMAL LOW (ref 12.0–15.0)
Immature Granulocytes: 1 %
Lymphocytes Relative: 17 %
Lymphs Abs: 1.9 10*3/uL (ref 0.7–4.0)
MCH: 32.3 pg (ref 26.0–34.0)
MCHC: 33.6 g/dL (ref 30.0–36.0)
MCV: 96.1 fL (ref 80.0–100.0)
Monocytes Absolute: 1.5 10*3/uL — ABNORMAL HIGH (ref 0.1–1.0)
Monocytes Relative: 14 %
Neutro Abs: 6.8 10*3/uL (ref 1.7–7.7)
Neutrophils Relative %: 62 %
Platelets: 362 10*3/uL (ref 150–400)
RBC: 3.37 MIL/uL — ABNORMAL LOW (ref 3.87–5.11)
RDW: 12.3 % (ref 11.5–15.5)
WBC: 11 10*3/uL — ABNORMAL HIGH (ref 4.0–10.5)
nRBC: 0 % (ref 0.0–0.2)

## 2020-01-19 LAB — GLUCOSE, CAPILLARY
Glucose-Capillary: 114 mg/dL — ABNORMAL HIGH (ref 70–99)
Glucose-Capillary: 158 mg/dL — ABNORMAL HIGH (ref 70–99)
Glucose-Capillary: 176 mg/dL — ABNORMAL HIGH (ref 70–99)
Glucose-Capillary: 184 mg/dL — ABNORMAL HIGH (ref 70–99)
Glucose-Capillary: 95 mg/dL (ref 70–99)
Glucose-Capillary: 96 mg/dL (ref 70–99)
Glucose-Capillary: 97 mg/dL (ref 70–99)

## 2020-01-19 LAB — COMPREHENSIVE METABOLIC PANEL
ALT: 40 U/L (ref 0–44)
AST: 38 U/L (ref 15–41)
Albumin: 2.4 g/dL — ABNORMAL LOW (ref 3.5–5.0)
Alkaline Phosphatase: 88 U/L (ref 38–126)
Anion gap: 11 (ref 5–15)
BUN: 12 mg/dL (ref 8–23)
CO2: 26 mmol/L (ref 22–32)
Calcium: 8.1 mg/dL — ABNORMAL LOW (ref 8.9–10.3)
Chloride: 95 mmol/L — ABNORMAL LOW (ref 98–111)
Creatinine, Ser: 0.48 mg/dL (ref 0.44–1.00)
GFR calc Af Amer: >60 mL/min (ref >60)
GFR calc non Af Amer: >60 mL/min (ref >60)
Glucose, Bld: 98 mg/dL (ref 70–99)
Potassium: 4.5 mmol/L (ref 3.5–5.1)
Sodium: 132 mmol/L — ABNORMAL LOW (ref 135–145)
Total Bilirubin: 1.4 mg/dL — ABNORMAL HIGH (ref 0.3–1.2)
Total Protein: 5.5 g/dL — ABNORMAL LOW (ref 6.5–8.1)

## 2020-01-19 LAB — PHOSPHORUS: Phosphorus: 3.5 mg/dL (ref 2.5–4.6)

## 2020-01-19 LAB — MAGNESIUM: Magnesium: 1.8 mg/dL (ref 1.7–2.4)

## 2020-01-19 MED ORDER — OXYCODONE HCL 5 MG PO TABS
5.0000 mg | ORAL_TABLET | ORAL | Status: DC | PRN
  Administered 2020-01-19: 10 mg via ORAL
  Administered 2020-01-20: 5 mg via ORAL
  Administered 2020-01-21 – 2020-01-22 (×3): 10 mg via ORAL
  Filled 2020-01-19 (×3): qty 2
  Filled 2020-01-19: qty 1
  Filled 2020-01-19: qty 2

## 2020-01-19 MED ORDER — ACETAMINOPHEN 325 MG PO TABS
650.0000 mg | ORAL_TABLET | Freq: Four times a day (QID) | ORAL | Status: DC
  Administered 2020-01-19 – 2020-01-22 (×14): 650 mg via ORAL
  Filled 2020-01-19 (×14): qty 2

## 2020-01-19 MED ORDER — CLOPIDOGREL BISULFATE 75 MG PO TABS
75.0000 mg | ORAL_TABLET | Freq: Every day | ORAL | Status: DC
  Administered 2020-01-20 – 2020-01-22 (×3): 75 mg via ORAL
  Filled 2020-01-19 (×3): qty 1

## 2020-01-19 MED ORDER — HYDROMORPHONE HCL 1 MG/ML IJ SOLN
0.5000 mg | INTRAMUSCULAR | Status: DC | PRN
  Administered 2020-01-19 – 2020-01-22 (×6): 0.5 mg via INTRAVENOUS
  Filled 2020-01-19 (×6): qty 0.5

## 2020-01-19 MED ORDER — EZETIMIBE 10 MG PO TABS
10.0000 mg | ORAL_TABLET | Freq: Every day | ORAL | Status: DC
  Administered 2020-01-19 – 2020-01-22 (×4): 10 mg via ORAL
  Filled 2020-01-19 (×4): qty 1

## 2020-01-19 MED ORDER — POTASSIUM CHLORIDE 10 MEQ/100ML IV SOLN
INTRAVENOUS | Status: AC
  Administered 2020-01-19: 10 meq
  Filled 2020-01-19: qty 200

## 2020-01-19 MED ORDER — METHOCARBAMOL 500 MG PO TABS
500.0000 mg | ORAL_TABLET | Freq: Three times a day (TID) | ORAL | Status: DC
  Administered 2020-01-19 – 2020-01-22 (×11): 500 mg via ORAL
  Filled 2020-01-19 (×11): qty 1

## 2020-01-19 MED ORDER — ROSUVASTATIN CALCIUM 10 MG PO TABS
10.0000 mg | ORAL_TABLET | Freq: Every day | ORAL | Status: DC
  Administered 2020-01-19 – 2020-01-21 (×3): 10 mg via ORAL
  Filled 2020-01-19 (×3): qty 1

## 2020-01-19 NOTE — Progress Notes (Signed)
[facility] Progress Note  5 Days Post-Op  Subjective: Patient reports she is tired because she was up all night. Had issues with IV potassium. Tolerated NGT being clamped and sips of clears. Had several BMs. Hopeful to be able to go home soon.   Objective: Vital signs in last 24 hours: Temp:  [97.6 F (36.4 C)-98.3 F (36.8 C)] 97.6 F (36.4 C) (05/18 0518) Pulse Rate:  [80-91] 91 (05/18 0518) Resp:  [19] 19 (05/17 2033) BP: (145-162)/(63-98) 145/98 (05/18 0518) SpO2:  [92 %-99 %] 98 % (05/18 0817) Last BM Date: 01/18/20  Intake/Output from previous day: 05/17 0701 - 05/18 0700 In: 2237.8 [I.V.:1439.1; IV Piggyback:798.7] Out: -  Intake/Output this shift: No intake/output data recorded.  PE: General: pleasant, WD, WN female who is laying in bed in NAD Heart: regular, rate, and rhythm.  Normal s1,s2. No obvious murmurs, gallops, or rubs noted.  Palpable radial and pedal pulses bilaterally Lungs: CTAB, no wheezes, rhonchi, or rales noted.  Respiratory effort nonlabored Abd: soft, NT, ND, some BS but hypoactive, VAC to midline MS: all 4 extremities are symmetrical with no cyanosis, clubbing, or edema. Skin: warm and dry with no masses, lesions, or rashes   Lab Results:  Recent Labs    01/18/20 0454 01/19/20 0530  WBC 10.2 11.0*  HGB 11.0* 10.9*  HCT 31.8* 32.4*  PLT 321 362   BMET Recent Labs    01/18/20 0454 01/18/20 0454 01/18/20 1513 01/19/20 0530  NA 138  --   --  132*  K 2.9*   < > 3.6 4.5  CL 94*  --   --  95*  CO2 33*  --   --  26  GLUCOSE 110*  --   --  98  BUN 11  --   --  12  CREATININE 0.49  --   --  0.48  CALCIUM 8.3*  --   --  8.1*   < > = values in this interval not displayed.   PT/INR No results for input(s): LABPROT, INR in the last 72 hours. CMP     Component Value Date/Time   NA 132 (L) 01/19/2020 0530   NA 141 01/05/2020 1043   K 4.5 01/19/2020 0530   CL 95 (L) 01/19/2020 0530   CO2 26 01/19/2020 0530   GLUCOSE 98  01/19/2020 0530   BUN 12 01/19/2020 0530   BUN 14 01/05/2020 1043   CREATININE 0.48 01/19/2020 0530   CREATININE 0.78 03/22/2017 1116   CALCIUM 8.1 (L) 01/19/2020 0530   PROT 5.5 (L) 01/19/2020 0530   PROT 7.2 01/05/2020 1043   ALBUMIN 2.4 (L) 01/19/2020 0530   ALBUMIN 4.7 01/05/2020 1043   AST 38 01/19/2020 0530   ALT 40 01/19/2020 0530   ALKPHOS 88 01/19/2020 0530   BILITOT 1.4 (H) 01/19/2020 0530   BILITOT 0.4 01/05/2020 1043   GFRNONAA >60 01/19/2020 0530   GFRNONAA 81 03/22/2017 1116   GFRAA >60 01/19/2020 0530   GFRAA >89 03/22/2017 1116   Lipase     Component Value Date/Time   LIPASE 23 01/13/2020 2331       Studies/Results: No results found.  Anti-infectives: Anti-infectives (From admission, onward)   Start     Dose/Rate Route Frequency Ordered Stop   01/14/20 1730  piperacillin-tazobactam (ZOSYN) IVPB 3.375 g     3.375 g 12.5 mL/hr over 240 Minutes Intravenous Every 8 hours 01/14/20 1723 01/19/20 1559   01/14/20 1001  sodium chloride 0.9 % with cefoTEtan (  CEFOTAN) ADS Med    Note to Pharmacy: Jefm Miles   : cabinet override      01/14/20 1001 01/14/20 1059   01/14/20 0945  cefoTEtan (CEFOTAN) 2 g in sodium chloride 0.9 % 100 mL IVPB     2 g 200 mL/hr over 30 Minutes Intravenous On call to O.R. 01/14/20 0941 01/14/20 1040   01/14/20 0015  cefTRIAXone (ROCEPHIN) 2 g in sodium chloride 0.9 % 100 mL IVPB     2 g 200 mL/hr over 30 Minutes Intravenous  Once 01/14/20 0008 01/14/20 0158   01/14/20 0015  metroNIDAZOLE (FLAGYL) IVPB 500 mg     500 mg 100 mL/hr over 60 Minutes Intravenous  Once 01/14/20 0008 01/14/20 0243       Assessment/Plan HTN PAD HLD Anxiety  Tobacco abuse  Cecal volvulus  S/P exploratory laparotomy with ileocecectomy 01/14/20 Dr. Johney Maine - POD#5 - tolerated clamping and CLD - removed NGT and ordered CLD, advance to FLD this evening  - mobilize!!! - continue VAC m/w/f  FEN: CLD, decreased IVF to 17 cc/h VTE: SCDs, lovenox ID:  Zosyn to stop today   LOS: 5 days    Norm Parcel , [facility] 01/19/2020, 9:20 AM Please see Amion for pager number during day hours 7:00am-4:30pm

## 2020-01-19 NOTE — Progress Notes (Signed)
Patient ambulated in hall with assistance and walker. Oxygen levels maintained above 95% on room air.

## 2020-01-19 NOTE — Consult Note (Signed)
Medical Consultation   Margaret Kelly  [id number]  DOB: 11-05-1950  DOA: 01/13/2020  PCP: Dorena Dew, FNP    Requesting physician: Dr. Michael Boston ([facility])  Reason for consultation: Multiple medical problems   History of Present Illness:  69 year old WF PMHx anxiety, HTN, PAD, HLD, nerve damage left side secondary to fall, LEFT hip pain, Hx hepatitis C. tobacco abuse   Woman, patient of Dr. Lucio Edward with [facility], who presented to the ED overnight with severe generalized abdominal pain and inability to move her bowels or expel gas for the last few days.  She had been suffering from constipation on a regular basis, had a colonoscopy with Dr. Fuller Plan in March of this year.  Over the last several days she was having increasing abdominal pain constipation and bloating with several calls to the office.  Recommendations were given, including advice to come to the emergency department on May 10 when the patient reported being in agony.  She then delayed coming to the ED until last night.  ED provider notes indicate the patient was in severe pain, lab review notes white count of 25,000.  CT scan shows sigmoid volvulus at about 3:00 this morning.  A call was placed to the [facility] GI group, Dr. Alexis Frock, as they were the group on for the [facility] long ED.  Dr. Therisa Doyne apparently spoke to the endoscopy team and put the patient on for 7:30 AM sigmoidoscopy with one of her partners.  I received a message from Dr. Therisa Doyne about an hour ago stating that once they determined this was a patient of [facility], the case would then be given to me.  I immediately spoke to our endoscopy team, asked him to bring the patient over from the ED, and I came immediately to [facility] long hospital.  This patient is in agony right now and cannot give much else in the way of history or review of systems.  She says the pain has been steadily escalating. At the time my evaluation, the surgical PA is  also evaluating her.   5/14 afebrile overnight, A/O x4, negative S OB.  Positive abdominal pain especially when she coughs.  Nausea controlled on current regimen.  Abdominal pain not controlled on current regimen  5/15 afebrile overnight A/O x4, negative S OB.  States overnight increased abdominal pain so started on PCA pump.  Negative nausea.  ADDENDUM; NG tube began to suction frank blood, exam him wound VAC was occluded and blood pooling.  5/16 A/O x4 patient states abdominal pain has decreased, surgery came by last night as well patient restarted suction on NG tube today no further bright red blood per suction tube.  Patient states nausea improved and much more comfortable.  5/17 A/O x4, positive abdominal pain but significantly decreased since she had BM today.,  Negative SOB, negative CP.  5/18 A/O x4, minimal abdominal pain.  Ambulated up and down hallway x1 without any difficulty with breathing, abdominal pain, CP.  States negative postprandial nausea/vomiting    Review of Systems:  Review of Systems  Constitutional: Negative.   HENT: Positive for sore throat.   Eyes: Negative.   Respiratory: Positive for cough and sputum production.   Cardiovascular: Negative.   Gastrointestinal: Positive for abdominal pain and nausea. Negative for diarrhea and vomiting.  Genitourinary: Negative.   Musculoskeletal: Negative.   Skin: Negative.   Endo/Heme/Allergies: Negative.  Psychiatric/Behavioral: Negative for depression, hallucinations, memory loss, substance abuse and suicidal ideas. The patient is nervous/anxious. The patient does not have insomnia.      Past Medical History: Past Medical History:  Diagnosis Date  . Anxiety   . Blood transfusion without reported diagnosis 1982  . GERD (gastroesophageal reflux disease)   . History of hepatitis C 10/13/2015  . Hyperlipidemia LDL goal <100 10/13/2015  . Hypertension   . Muscle spasm of left lower extremity 10/13/2015  . Nerve damage  2001   left side as result of fall  . PAD (peripheral artery disease) ([place])   . Pain of left hip joint 02/21/2018  . Plantar wart     Past Surgical History: Past Surgical History:  Procedure Laterality Date  . ABDOMINAL AORTOGRAM W/LOWER EXTREMITY Bilateral 03/26/2019   Procedure: ABDOMINAL AORTOGRAM W/LOWER EXTREMITY;  Surgeon: Lorretta Harp, MD;  Location: [place] [facility];  Service: Cardiovascular;  Laterality: Bilateral;  . ABDOMINAL AORTOGRAM W/LOWER EXTREMITY Bilateral 08/03/2019   Procedure: ABDOMINAL AORTOGRAM W/LOWER EXTREMITY;  Surgeon: Lorretta Harp, MD;  Location: [place] [facility];  Service: Cardiovascular;  Laterality: Bilateral;  . ABDOMINAL HYSTERECTOMY  1982  . CARPAL TUNNEL RELEASE Left 2005  . foot spur Left 2007   foot  . LAPAROTOMY N/A 01/14/2020   Procedure: OPEN RIGHT COLECTOMY, EXPLORATORY LAPAROTOMY;  Surgeon: Michael Boston, MD;  Location: [facility];  Service: General;  Laterality: N/A;  . PERIPHERAL VASCULAR BALLOON ANGIOPLASTY Left 08/03/2019   Procedure: PERIPHERAL VASCULAR BALLOON ANGIOPLASTY;  Surgeon: Lorretta Harp, MD;  Location: [place] [facility];  Service: Cardiovascular;  Laterality: Left;  SFA/ popliteal     Allergies:   Allergies  Allergen Reactions  . Amlodipine Other (See Comments)    Ringing in ears     Social History:  reports that she has been smoking cigarettes. She has been smoking about 0.50 packs per day. She has quit using smokeless tobacco. She reports current alcohol use. She reports that she does not use drugs.   Family History: Family History  Problem Relation Age of Onset  . Colon cancer Neg Hx   . Esophageal cancer Neg Hx   . Rectal cancer Neg Hx   . Stomach cancer Neg Hx      Procedures/Significant Events:  5/13;EXPLORATORY LAPAROTOMY, ILEOCOLECTOMY, TAP BLOCK - BILATERAL    I have personally reviewed and interpreted all radiology studies and my findings are as above.  VENTILATOR SETTINGS: Nasal  cannula 5/17 Flow 2 L/min SPO2 96%    Cultures   Antimicrobials: Anti-infectives (From admission, onward)   Start     Dose/Rate Stop   01/14/20 1730  piperacillin-tazobactam (ZOSYN) IVPB 3.375 g     3.375 g 12.5 mL/hr over 240 Minutes 01/19/20 1359   01/14/20 1001  sodium chloride 0.9 % with cefoTEtan (CEFOTAN) ADS Med    Note to Pharmacy: Jefm Miles   : cabinet override    01/14/20 1059   01/14/20 0945  cefoTEtan (CEFOTAN) 2 g in sodium chloride 0.9 % 100 mL IVPB     2 g 200 mL/hr over 30 Minutes 01/14/20 1040   01/14/20 0015  cefTRIAXone (ROCEPHIN) 2 g in sodium chloride 0.9 % 100 mL IVPB     2 g 200 mL/hr over 30 Minutes 01/14/20 0158   01/14/20 0015  metroNIDAZOLE (FLAGYL) IVPB 500 mg     500 mg 100 mL/hr over 60 Minutes 01/14/20 [place] /  TUBES:      Continuous Infusions: . chlorproMAZINE (THORAZINE) IV    . lactated ringers 50 mL/hr at 01/19/20 1035  . piperacillin-tazobactam (ZOSYN)  IV 3.375 g (01/19/20 0839)     Physical Exam: Vitals:   01/18/20 2010 01/18/20 2033 01/19/20 0518 01/19/20 0817  BP:  (!) 148/63 (!) 145/98   Pulse:  86 91   Resp:  19    Temp:  98.2 F (36.8 C) 97.6 F (36.4 C)   TempSrc:  Oral Oral   SpO2: 92% 97% 99% 98%  Weight:      Height:        Physical Exam:  General: A/O x4, no acute respiratory distress Eyes: negative scleral hemorrhage, negative anisocoria, negative icterus ENT: Negative Runny nose, negative gingival bleeding, Neck:  Negative scars, masses, torticollis, lymphadenopathy, JVD Lungs: Clear to auscultation bilaterally without wheezes or crackles Cardiovascular: Regular rate and rhythm without murmur gallop or rub normal S1 and S2 Abdomen: Positive minimal abdominal pain, nondistended, positive soft, bowel sounds, no rebound, no ascites, no appreciable mass, wound VAC in place minimal serous drainage Extremities: No significant cyanosis, clubbing, or edema bilateral lower  extremities Skin: Negative rashes, lesions, ulcers Psychiatric:  Negative depression, negative anxiety, negative fatigue, negative mania  Central nervous system:  Cranial nerves II through XII intact, tongue/uvula midline, all extremities muscle strength 5/5, sensation intact throughout, negative dysarthria, negative expressive aphasia, negative receptive aphasia.    Data reviewed:  I have personally reviewed following labs and imaging studies Labs:  CBC: Recent Labs  Lab 01/15/20 0800 01/15/20 0800 01/16/20 0716 01/16/20 1910 01/17/20 0629 01/18/20 0454 01/19/20 0530  WBC 15.8*   < > 13.2* 14.8* 11.8* 10.2 11.0*  NEUTROABS 13.1*  --  10.8*  --  9.0* 6.6 6.8  HGB 10.7*   < > 9.8* 10.0* 9.9* 11.0* 10.9*  HCT 31.1*   < > 29.4* 29.7* 29.7* 31.8* 32.4*  MCV 96.9   < > 98.7 97.1 96.1 94.4 96.1  PLT 237   < > 258 288 295 321 362   < > = values in this interval not displayed.    Basic Metabolic Panel: Recent Labs  Lab 01/15/20 0800 01/15/20 0800 01/16/20 0716 01/16/20 0716 01/17/20 0629 01/17/20 0629 01/18/20 0454 01/18/20 0454 01/18/20 1513 01/19/20 0530  NA 137  --  136  --  139  --  138  --   --  132*  K 3.7   < > 3.5   < > 2.7*   < > 2.9*   < > 3.6 4.5  CL 100  --  100  --  95*  --  94*  --   --  95*  CO2 28  --  30  --  30  --  33*  --   --  26  GLUCOSE 135*  --  109*  --  123*  --  110*  --   --  98  BUN 15  --  14  --  10  --  11  --   --  12  CREATININE 0.51  --  0.54  --  0.54  --  0.49  --   --  0.48  CALCIUM 7.8*  --  8.3*  --  8.3*  --  8.3*  --   --  8.1*  MG 1.7   < > 2.0  --  1.6*  --  1.9  --  1.9 1.8  PHOS 2.4*  --  2.7  --  3.7  --  3.5  --   --  3.5   < > = values in this interval not displayed.   GFR Estimated Creatinine Clearance: 64.8 mL/min (by C-G formula based on SCr of 0.48 mg/dL). Liver Function Tests: Recent Labs  Lab 01/15/20 0800 01/16/20 0716 01/17/20 0629 01/18/20 0454 01/19/20 0530  AST 20 24 47* 62* 38  ALT 14 17 36 45* 40    ALKPHOS 35* 44 62 83 88  BILITOT 1.1 1.0 1.4* 1.5* 1.4*  PROT 5.0* 5.5* 5.5* 5.6* 5.5*  ALBUMIN 2.4* 2.6* 2.4* 2.5* 2.4*   Recent Labs  Lab 01/13/20 2331  LIPASE 23   No results for input(s): AMMONIA in the last 168 hours. Coagulation profile Recent Labs  Lab 01/14/20 0950  INR 1.1    Cardiac Enzymes: No results for input(s): CKTOTAL, CKMB, CKMBINDEX, TROPONINI in the last 168 hours. BNP: Invalid input(s): POCBNP CBG: Recent Labs  Lab 01/18/20 2047 01/19/20 0046 01/19/20 0354 01/19/20 0813 01/19/20 1153  GLUCAP 88 97 95 96 176*   D-Dimer No results for input(s): DDIMER in the last 72 hours. Hgb A1c No results for input(s): HGBA1C in the last 72 hours. Lipid Profile No results for input(s): CHOL, HDL, LDLCALC, TRIG, CHOLHDL, LDLDIRECT in the last 72 hours. Thyroid function studies No results for input(s): TSH, T4TOTAL, T3FREE, THYROIDAB in the last 72 hours.  Invalid input(s): FREET3 Anemia work up No results for input(s): VITAMINB12, FOLATE, FERRITIN, TIBC, IRON, RETICCTPCT in the last 72 hours. Urinalysis    Component Value Date/Time   COLORURINE YELLOW 01/14/2020 0427   APPEARANCEUR CLEAR 01/14/2020 0427   LABSPEC 1.005 01/14/2020 0427   PHURINE 5.0 01/14/2020 0427   GLUCOSEU NEGATIVE 01/14/2020 0427   HGBUR NEGATIVE 01/14/2020 0427   BILIRUBINUR NEGATIVE 01/14/2020 0427   BILIRUBINUR neg 01/05/2020 1006   KETONESUR 5 (A) 01/14/2020 0427   PROTEINUR 30 (A) 01/14/2020 0427   UROBILINOGEN 0.2 01/05/2020 1006   UROBILINOGEN 0.2 11/20/2017 1049   NITRITE NEGATIVE 01/14/2020 0427   LEUKOCYTESUR NEGATIVE 01/14/2020 0427     Microbiology Recent Results (from the past 240 hour(s))  Blood Culture (routine x 2)     Status: None   Collection Time: 01/14/20 12:08 AM   Specimen: BLOOD RIGHT HAND  Result Value Ref Range Status   Specimen Description   Final    BLOOD RIGHT HAND Performed at [facility], [place] [address], [place],  [place] [address]    Special Requests   Final    BOTTLES DRAWN AEROBIC AND ANAEROBIC Blood Culture adequate volume Performed at [facility] [address] [place], [place] [address]    Culture   Final    NO GROWTH 5 DAYS Performed at [facility], [place] [address], [place], [place] [address]    Report Status 01/19/2020 FINAL  Final  Blood Culture (routine x 2)     Status: None   Collection Time: 01/14/20 12:57 AM   Specimen: BLOOD  Result Value Ref Range Status   Specimen Description   Final    BLOOD RIGHT ANTECUBITAL Performed at [place] [address] [place], [place] [address]    Special Requests   Final    BOTTLES DRAWN AEROBIC AND ANAEROBIC Blood Culture adequate volume Performed at [place] [address], [place],  [address]    Culture   Final    NO GROWTH 5 DAYS Performed at [facility], [address] [place], [place] [address]    Report Status 01/19/2020 FINAL  Final  SARS Coronavirus 2 by RT PCR (hospital order, performed in [facility] hospital lab) Nasopharyngeal Nasopharyngeal Swab     Status: None   Collection Time: 01/14/20  3:23 AM   Specimen: Nasopharyngeal Swab  Result Value Ref Range Status   SARS Coronavirus 2 NEGATIVE NEGATIVE Final    Comment: (NOTE) SARS-CoV-2 target nucleic acids are NOT DETECTED. The SARS-CoV-2 RNA is generally detectable in upper and lower respiratory specimens during the acute phase of infection. The lowest concentration of SARS-CoV-2 viral copies this assay can detect is 250 copies / mL. A negative result does not preclude SARS-CoV-2 infection and should not be used as the sole basis for treatment or other patient management decisions.  A negative result may occur with improper specimen collection / handling, submission of specimen other than nasopharyngeal swab, presence of viral mutation(s) within the areas targeted by this assay, and inadequate  number of viral copies (<250 copies / mL). A negative result must be combined with clinical observations, patient history, and epidemiological information. Fact Sheet for Patients:   StrictlyIdeas.no Fact Sheet for Healthcare Providers: BankingDealers.co.za This test is not yet approved or cleared  by the [place] FDA and has been authorized for detection and/or diagnosis of SARS-CoV-2 by FDA under an Emergency Use Authorization (EUA).  This EUA will remain in effect (meaning this test can be used) for the duration of the COVID-19 declaration under Section 564(b)(1) of the Act, 21 U.S.C. section 360bbb-3(b)(1), unless the authorization is terminated or revoked sooner. Performed at [facility], [place] [address], [place], [place] [address]        Inpatient Medications:   Scheduled Meds: . acetaminophen  650 mg Oral Q6H  . chlorhexidine  15 mL Mouth Rinse BID  . dextromethorphan-guaiFENesin  1 tablet Oral BID  . enoxaparin (LOVENOX) injection  40 mg Subcutaneous Q24H  . insulin aspart  0-15 Units Subcutaneous Q4H  . ipratropium-albuterol  3 mL Nebulization BID  . mouth rinse  15 mL Mouth Rinse BID  . mouth rinse  15 mL Mouth Rinse q12n4p  . methocarbamol  500 mg Oral TID  . metoprolol tartrate  5 mg Intravenous Q6H  . pantoprazole (PROTONIX) IV  40 mg Intravenous QHS   Continuous Infusions: . chlorproMAZINE (THORAZINE) IV    . lactated ringers 50 mL/hr at 01/19/20 1035  . piperacillin-tazobactam (ZOSYN)  IV 3.375 g (01/19/20 0839)     Radiological Exams on Admission: No results found.  Impression/Recommendations Principal Problem:   Cecal volvulus s/p ileocolectomy 01/14/2020 Active Problems:   Essential hypertension   Hyperlipidemia LDL goal <100   History of hepatitis C   Pain of left hip joint   Peripheral arterial disease ([place])   Long term current use of anticoagulant therapy   Nerve damage    GERD (gastroesophageal reflux disease)   Blood transfusion without reported diagnosis   Anxiety   Bowel obstruction ([facility])   Sigmoid volvulus (H[facility])  Essential HTN -5/15 Metoprolol IV 5 mg QID hold for MAP<65 -5/16 furosemide 60 mg x 1 -5/16 Hydralazine 7 mg  PRN -5/18 BP starting to trend up we will add back some of her medication.   -5/18 Coreg 12.5 mg BID (1/2 of home regimen)   PAD -5/18 restart Plavix 75 mg daily  HLD -5/18 Zetia 10 mg daily -5/18 rosuvastatin 10 mg daily   Pain regimen -PCA pump per surgery  Anxiety -  Ativan 1 to 2 mg PRN   Tobacco abuse -Nicotine patch PRN  Sigmoid volvulus/Bowel obstruction -5/13 s/p ex lap see above -5/15 RN instructed to discontinue NG suction secondary to frank blood being suctioned, increasing abdominal pain.  Surgery paged to bedside -5/18 minimal discharge from wound VAC.  Refractory nausea -Zofran PRN -Thorazine PRN if Zofran fails to alleviate nausea  Hypokalemia -Potassium goal> 4  Hypomagnesmia -Magnesium goal> 2      Thank you for this consultation.  Our [facility] hospitalist team will follow the patient with you.   Time Spent: 35 minutes  Shemeca Lukasik, Geraldo Docker M.D. [facility] 01/19/2020, 12:26 PM  [id number]

## 2020-01-20 DIAGNOSIS — K219 Gastro-esophageal reflux disease without esophagitis: Secondary | ICD-10-CM

## 2020-01-20 DIAGNOSIS — I10 Essential (primary) hypertension: Secondary | ICD-10-CM

## 2020-01-20 DIAGNOSIS — Z8619 Personal history of other infectious and parasitic diseases: Secondary | ICD-10-CM

## 2020-01-20 DIAGNOSIS — K56609 Unspecified intestinal obstruction, unspecified as to partial versus complete obstruction: Secondary | ICD-10-CM

## 2020-01-20 DIAGNOSIS — I739 Peripheral vascular disease, unspecified: Secondary | ICD-10-CM

## 2020-01-20 DIAGNOSIS — E785 Hyperlipidemia, unspecified: Secondary | ICD-10-CM

## 2020-01-20 DIAGNOSIS — Z5189 Encounter for other specified aftercare: Secondary | ICD-10-CM

## 2020-01-20 DIAGNOSIS — F419 Anxiety disorder, unspecified: Secondary | ICD-10-CM

## 2020-01-20 LAB — COMPREHENSIVE METABOLIC PANEL
ALT: 39 U/L (ref 0–44)
AST: 34 U/L (ref 15–41)
Albumin: 2.2 g/dL — ABNORMAL LOW (ref 3.5–5.0)
Alkaline Phosphatase: 75 U/L (ref 38–126)
Anion gap: 8 (ref 5–15)
BUN: 6 mg/dL — ABNORMAL LOW (ref 8–23)
CO2: 27 mmol/L (ref 22–32)
Calcium: 8 mg/dL — ABNORMAL LOW (ref 8.9–10.3)
Chloride: 101 mmol/L (ref 98–111)
Creatinine, Ser: 0.38 mg/dL — ABNORMAL LOW (ref 0.44–1.00)
GFR calc Af Amer: >60 mL/min (ref >60)
GFR calc non Af Amer: >60 mL/min (ref >60)
Glucose, Bld: 123 mg/dL — ABNORMAL HIGH (ref 70–99)
Potassium: 3.9 mmol/L (ref 3.5–5.1)
Sodium: 136 mmol/L (ref 135–145)
Total Bilirubin: 1.1 mg/dL (ref 0.3–1.2)
Total Protein: 5.1 g/dL — ABNORMAL LOW (ref 6.5–8.1)

## 2020-01-20 LAB — CBC WITH DIFFERENTIAL/PLATELET
Abs Immature Granulocytes: 0.3 10*3/uL — ABNORMAL HIGH (ref 0.00–0.07)
Basophils Absolute: 0.1 10*3/uL (ref 0.0–0.1)
Basophils Relative: 1 %
Eosinophils Absolute: 0.5 10*3/uL (ref 0.0–0.5)
Eosinophils Relative: 5 %
HCT: 32.7 % — ABNORMAL LOW (ref 36.0–46.0)
Hemoglobin: 10.7 g/dL — ABNORMAL LOW (ref 12.0–15.0)
Immature Granulocytes: 3 %
Lymphocytes Relative: 21 %
Lymphs Abs: 2.1 10*3/uL (ref 0.7–4.0)
MCH: 31.7 pg (ref 26.0–34.0)
MCHC: 32.7 g/dL (ref 30.0–36.0)
MCV: 96.7 fL (ref 80.0–100.0)
Monocytes Absolute: 1.3 10*3/uL — ABNORMAL HIGH (ref 0.1–1.0)
Monocytes Relative: 13 %
Neutro Abs: 5.8 10*3/uL (ref 1.7–7.7)
Neutrophils Relative %: 57 %
Platelets: 393 10*3/uL (ref 150–400)
RBC: 3.38 MIL/uL — ABNORMAL LOW (ref 3.87–5.11)
RDW: 12.3 % (ref 11.5–15.5)
WBC: 9.9 10*3/uL (ref 4.0–10.5)
nRBC: 0 % (ref 0.0–0.2)

## 2020-01-20 LAB — GLUCOSE, CAPILLARY
Glucose-Capillary: 103 mg/dL — ABNORMAL HIGH (ref 70–99)
Glucose-Capillary: 122 mg/dL — ABNORMAL HIGH (ref 70–99)
Glucose-Capillary: 206 mg/dL — ABNORMAL HIGH (ref 70–99)

## 2020-01-20 LAB — PHOSPHORUS: Phosphorus: 3.4 mg/dL (ref 2.5–4.6)

## 2020-01-20 LAB — MAGNESIUM: Magnesium: 1.6 mg/dL — ABNORMAL LOW (ref 1.7–2.4)

## 2020-01-20 MED ORDER — LOSARTAN POTASSIUM 50 MG PO TABS
50.0000 mg | ORAL_TABLET | Freq: Every day | ORAL | Status: DC
  Administered 2020-01-20 – 2020-01-21 (×2): 50 mg via ORAL
  Filled 2020-01-20 (×2): qty 1

## 2020-01-20 MED ORDER — ONDANSETRON HCL 4 MG/2ML IJ SOLN: 4.0000 mg | Freq: Four times a day (QID) | INTRAMUSCULAR | Status: DC | PRN

## 2020-01-20 MED ORDER — SODIUM CHLORIDE 0.9% FLUSH
3.0000 mL | Freq: Two times a day (BID) | INTRAVENOUS | Status: DC
  Administered 2020-01-20 – 2020-01-22 (×4): 3 mL via INTRAVENOUS

## 2020-01-20 MED ORDER — HYDRALAZINE HCL 20 MG/ML IJ SOLN
10.0000 mg | Freq: Four times a day (QID) | INTRAMUSCULAR | Status: DC | PRN
  Administered 2020-01-22: 10 mg via INTRAVENOUS
  Filled 2020-01-20: qty 1

## 2020-01-20 MED ORDER — SODIUM CHLORIDE 0.9 % IV SOLN: 250.0000 mL | INTRAVENOUS | Status: DC | PRN

## 2020-01-20 MED ORDER — SODIUM CHLORIDE 0.9% FLUSH: 3.0000 mL | INTRAVENOUS | Status: DC | PRN

## 2020-01-20 MED ORDER — MAGNESIUM SULFATE 2 GM/50ML IV SOLN
2.0000 g | Freq: Once | INTRAVENOUS | Status: AC
  Administered 2020-01-20: 2 g via INTRAVENOUS
  Filled 2020-01-20: qty 50

## 2020-01-20 MED ORDER — IPRATROPIUM-ALBUTEROL 0.5-2.5 (3) MG/3ML IN SOLN: 3.0000 mL | RESPIRATORY_TRACT | Status: DC | PRN

## 2020-01-20 MED ORDER — CARVEDILOL 25 MG PO TABS
25.0000 mg | ORAL_TABLET | Freq: Two times a day (BID) | ORAL | Status: DC
  Administered 2020-01-20 – 2020-01-22 (×5): 25 mg via ORAL
  Filled 2020-01-20 (×5): qty 1

## 2020-01-20 NOTE — Progress Notes (Signed)
[facility] Progress Note  6 Days Post-Op  Subjective: Tolerating FLD and having bowel function. Changed VAC dressing today, patient prefers to continue this at home for now. She has a friend living with her for the summer but does not want friend to have to change her dressing daily if she can continue with VAC. Pain control improving.   Objective: Vital signs in last 24 hours: Temp:  [97.9 F (36.6 C)] 97.9 F (36.6 C) (05/19 0403) Pulse Rate:  [85] 85 (05/19 0403) Resp:  [16-18] 16 (05/19 0403) BP: (150-188)/(79-95) 188/95 (05/19 0403) SpO2:  [94 %-96 %] 94 % (05/19 0741) Last BM Date: 01/19/20  Intake/Output from previous day: 05/18 0701 - 05/19 0700 In: 480 [P.O.:480] Out: 300 [Urine:300] Intake/Output this shift: No intake/output data recorded.  PE: General: pleasant,WD, WN female who is laying in bed in NAD Heart: regular, rate, and rhythm. Normal s1,s2. No obvious murmurs, gallops, or rubs noted. Palpable radial and pedal pulses bilaterally Lungs: CTAB, no wheezes, rhonchi, or rales noted. Respiratory effort nonlabored Abd: soft, NT, ND,+BS, midline wound measures 14 x 4.5 x 3 cm with healthy granulating tissue, VAC replaced with good seal MS: all 4 extremities are symmetrical with no cyanosis, clubbing, or edema. Skin: warm and dry with no masses, lesions, or rashes   Lab Results:  Recent Labs    01/19/20 0530 01/20/20 0521  WBC 11.0* 9.9  HGB 10.9* 10.7*  HCT 32.4* 32.7*  PLT 362 393   BMET Recent Labs    01/19/20 0530 01/20/20 0521  NA 132* 136  K 4.5 3.9  CL 95* 101  CO2 26 27  GLUCOSE 98 123*  BUN 12 6*  CREATININE 0.48 0.38*  CALCIUM 8.1* 8.0*   PT/INR No results for input(s): LABPROT, INR in the last 72 hours. CMP     Component Value Date/Time   NA 136 01/20/2020 0521   NA 141 01/05/2020 1043   K 3.9 01/20/2020 0521   CL 101 01/20/2020 0521   CO2 27 01/20/2020 0521   GLUCOSE 123 (H) 01/20/2020 0521   BUN 6 (L)  01/20/2020 0521   BUN 14 01/05/2020 1043   CREATININE 0.38 (L) 01/20/2020 0521   CREATININE 0.78 03/22/2017 1116   CALCIUM 8.0 (L) 01/20/2020 0521   PROT 5.1 (L) 01/20/2020 0521   PROT 7.2 01/05/2020 1043   ALBUMIN 2.2 (L) 01/20/2020 0521   ALBUMIN 4.7 01/05/2020 1043   AST 34 01/20/2020 0521   ALT 39 01/20/2020 0521   ALKPHOS 75 01/20/2020 0521   BILITOT 1.1 01/20/2020 0521   BILITOT 0.4 01/05/2020 1043   GFRNONAA >60 01/20/2020 0521   GFRNONAA 81 03/22/2017 1116   GFRAA >60 01/20/2020 0521   GFRAA >89 03/22/2017 1116   Lipase     Component Value Date/Time   LIPASE 23 01/13/2020 2331       Studies/Results: No results found.  Anti-infectives: Anti-infectives (From admission, onward)   Start     Dose/Rate Route Frequency Ordered Stop   01/14/20 1730  piperacillin-tazobactam (ZOSYN) IVPB 3.375 g     3.375 g 12.5 mL/hr over 240 Minutes Intravenous Every 8 hours 01/14/20 1723 01/19/20 1239   01/14/20 1001  sodium chloride 0.9 % with cefoTEtan (CEFOTAN) ADS Med    Note to Pharmacy: Jefm Miles   : cabinet override      01/14/20 1001 01/14/20 1059   01/14/20 0945  cefoTEtan (CEFOTAN) 2 g in sodium chloride 0.9 % 100 mL IVPB  2 g 200 mL/hr over 30 Minutes Intravenous On call to O.R. 01/14/20 0941 01/14/20 1040   01/14/20 0015  cefTRIAXone (ROCEPHIN) 2 g in sodium chloride 0.9 % 100 mL IVPB     2 g 200 mL/hr over 30 Minutes Intravenous  Once 01/14/20 0008 01/14/20 0158   01/14/20 0015  metroNIDAZOLE (FLAGYL) IVPB 500 mg     500 mg 100 mL/hr over 60 Minutes Intravenous  Once 01/14/20 0008 01/14/20 0243       Assessment/Plan HTN PAD HLD Anxiety  Tobacco abuse  Cecal volvulus  S/P exploratory laparotomy with ileocecectomy 01/14/20 Dr. Johney Maine - POD#6 - tolerating FLD - advance to soft diet today - continue to mobilize - continue VAC m/w/f - placed orders for HH NPWT - likely home tomorrow if tolerating soft diet   FEN: soft diet, SLIV VTE: SCDs,  lovenox ID: Zosyn 5/13>5/18  LOS: 6 days    Norm Parcel , [facility] 01/20/2020, 9:16 AM Please see Amion for pager number during day hours 7:00am-4:30pm

## 2020-01-20 NOTE — Consult Note (Signed)
[facility] PA Saverio Danker at bedside this am when [facility] contacted nurse to prepare for dressing change.  Kelly removed dressing for assessment and has replaced dressing per bedside nurse.  [place], [place], [place]

## 2020-01-20 NOTE — Progress Notes (Signed)
PROGRESS NOTE    Margaret Kelly  [id number] DOB: [birth year] DOA: 01/13/2020 PCP: Dorena Dew, FNP   Brief Narrative:  History of Present Illness: The patient is a 69 year old Caucasian female with a past medical history significant for but not limited to Anxiety, HTN, PAD, HLD, nerve damage left side secondary to fall, LEFT hip pain, Hx hepatitis C. tobacco abuse.  She is a patient of Dr. Lucio Edward with lumbar GI who presented to the emergency room with severe generalized abdominal pain and inability to move her bowels or expel gas for the last 2 days.  She had been suffering from constipation on a regular basis and had a colonoscopy with Dr. Fuller Plan in March of this year.  And over the last few days she has increasing abdominal pain, bloating and had several calls with the GI clinic.  Recommendations were given and she is advised to come to the emergency department and on May 10 she presented when she was in severe pain.  CT scan showed a volvulus at 3 in the morning.  She underwent urgent sigmoidoscopy as her volvulus was not fully decompressed and general surgery was consulted.  Because she was only partially decompressed and continues have significant abdominal pain she underwent emergent operation by surgery.  After her surgery, internal medicine was consulted for assistance with management of her medical comorbidities   5/14 afebrile overnight, A/O x4, negative S OB.  Positive abdominal pain especially when she coughs.  Nausea controlled on current regimen.  Abdominal pain not controlled on current regimen  5/15 afebrile overnight A/O x4, negative S OB.  States overnight increased abdominal pain so started on PCA pump.  Negative nausea.  ADDENDUM; NG tube began to suction frank blood, exam him wound VAC was occluded and blood pooling.  5/16 A/O x4 patient states abdominal pain has decreased, surgery came by last night as well patient restarted suction on NG tube today no further  bright red blood per suction tube.  Patient states nausea improved and much more comfortable.  5/17 A/O x4, positive abdominal pain but significantly decreased since she had BM today.,  Negative SOB, negative CP.  5/18 A/O x4, minimal abdominal pain.  Ambulated up and down hallway x1 without any difficulty with breathing, abdominal pain, CP.  States negative postprandial nausea/vomiting  01/20/2020 she is improving and not been as much pain.  States that she received pain medication and is comfortable currently.  General surgery is planning on discharging the patient tomorrow Tolerate her soft diet   Assessment & Plan:   Principal Problem:   Cecal volvulus s/p ileocolectomy 01/14/2020 Active Problems:   Essential hypertension   Hyperlipidemia LDL goal <100   History of hepatitis C   Pain of left hip joint   Peripheral arterial disease ([place])   Long term current use of anticoagulant therapy   Nerve damage   GERD (gastroesophageal reflux disease)   Blood transfusion without reported diagnosis   Anxiety   Bowel obstruction (H[facility])   Sigmoid volvulus ([place])  Essential HTN -On 5/15 started Metoprolol IV 5 mg QID hold for MAP<65 and will stop scheduled -On 5/16 Furosemide 60 mg x 1 -On 5/16 Hydralazine 7 mg PRN and will increase to 10 mg q6prn  -5/18 BP starting to trend up we will add back some of her medication.   -5/18 Coreg 12.5 mg BID (1/2 of home regimen) and today will go to full dose of 25 mg po BID -Will also resume Losartan 50  mg po Daily (Home dose of 100 mg po Daily) -BP this AM was 188/95 -Continue to Monitor BP per Protocol   PAD -C/w Clopidogrel 75 mg daily  HLD -C/w Ezetimbie 10 mg daily -Rosuvastatin 10 mg daily   Post-Operative Abdominal Pain  -PCA pump per surgery and also has hydromorphone 0.5 mg IV every 3 hours as needed for breakthrough -C/w acetaminophen-650 mg p.o. every 6h scheduled as well as oxycodone IR 5-10 mg p.o. every 4 hours as needed for  moderate pain  GERD -Continue with pantoprazole 40 mg IV nightly  Anxiety -C/w Lorazepam 1-2 mg q6hprn Anxiety   Tobacco Abuse -Smoking Cessation given  -C/w Nicotine 21 mg TD Patch   Sigmoid volvulus/Bowel obstruction -5/13 s/p ex lap see above -5/15 RN instructed to discontinue NG suction secondary to frank blood being suctioned, increasing abdominal pain.  Surgery paged to bedside -5/18 minimal discharge from wound VAC. -Management per Surgery -[place] nurse consulted   Refractory Nausea -C/w IV Zofran PRN -Thorazine PRN if Zofran fails to alleviate nausea  Hypokalemia -Potassium goal> 4 -K+ this AM was 3.9 -Continue to Monitor and Replete as Necessary -Repeat CMP in the AM   Hypomagnesmia -Magnesium goal> 2 -Mag Level this AM was 1.2 -Replete with IV Mag Sulfate 2 grams -Continue to Monitor and Replete as Necessary -Repeat Mag Level in the AM   DVT prophylaxis: Enoxaparin 40 mg sq q24h  Code Status: FULL CODE  Family Communication: No family present at bedside  Disposition Plan: Per Primary; Anticipating D/C home in the next 24-48 hours   Consultants:   [facility] (Primary)  Gastroenterology   Procedures:  Sigmoidoscopy  Procedure by Dr. Johney Maine on 01/14/20 EXPLORATORY LAPAROTOMY  ILEOCOLECTOMY TAP BLOCK - BILATERAL  Antimicrobials:  Anti-infectives (From admission, onward)   Start     Dose/Rate Route Frequency Ordered Stop   01/14/20 1730  piperacillin-tazobactam (ZOSYN) IVPB 3.375 g     3.375 g 12.5 mL/hr over 240 Minutes Intravenous Every 8 hours 01/14/20 1723 01/19/20 1239   01/14/20 1001  sodium chloride 0.9 % with cefoTEtan (CEFOTAN) ADS Med    Note to Pharmacy: Jefm Miles   : cabinet override      01/14/20 1001 01/14/20 1059   01/14/20 0945  cefoTEtan (CEFOTAN) 2 g in sodium chloride 0.9 % 100 mL IVPB     2 g 200 mL/hr over 30 Minutes Intravenous On call to O.R. 01/14/20 0941 01/14/20 1040   01/14/20 0015  cefTRIAXone  (ROCEPHIN) 2 g in sodium chloride 0.9 % 100 mL IVPB     2 g 200 mL/hr over 30 Minutes Intravenous  Once 01/14/20 0008 01/14/20 0158   01/14/20 0015  metroNIDAZOLE (FLAGYL) IVPB 500 mg     500 mg 100 mL/hr over 60 Minutes Intravenous  Once 01/14/20 0008 01/14/20 0243     Subjective: And examined at bedside and she had just gotten some pain medication and states that she is doing okay.  No nausea or vomiting.  Passing gas but states that she has not had a bowel movement today.  Had one yesterday.  No lightheadedness or dizziness.  No other concerns or complaints at this time.  Objective: Vitals:   01/19/20 1415 01/19/20 1924 01/20/20 0403 01/20/20 0741  BP: (!) 150/79  (!) 188/95   Pulse: 85  85   Resp: 18  16   Temp: 97.9 F (36.6 C)  97.9 F (36.6 C)   TempSrc: Oral     SpO2: 94%  95% 96% 94%  Weight:      Height:        Intake/Output Summary (Last 24 hours) at 01/20/2020 1450 Last data filed at 01/19/2020 1700 Gross per 24 hour  Intake --  Output 300 ml  Net -300 ml   Filed Weights   01/14/20 0736 01/14/20 0957 01/14/20 1006  Weight: 70.8 kg 70.8 kg 70.8 kg   Examination: Physical Exam:  Constitutional: WN/WD overweight Caucasian female currently in NAD and appears calm and comfortable Eyes: Lids and conjunctivae normal, sclerae anicteric  ENMT: External Ears, Nose appear normal. Grossly normal hearing. Neck: Appears normal, supple, no cervical masses, normal ROM, no appreciable thyromegaly medical no JVD Respiratory: Slightly diminished to auscultation bilaterally, no wheezing, rales, rhonchi or crackles. Normal respiratory effort and patient is not tachypenic. No accessory muscle use.  Unlabored breathing Cardiovascular: RRR, no murmurs / rubs / gallops. S1 and S2 auscultated. No extremity edema.  Abdomen: Soft, tender to palpate, slightly distended.  Has a wound VAC in place in the midline abdomen. bowel sounds positive.  GU: Deferred. Musculoskeletal: No clubbing /  cyanosis of digits/nails. No joint deformity upper and lower extremities..  Skin: No rashes, lesions, ulcers on limited skin evaluation. No induration; Warm and dry.  Neurologic: CN 2-12 grossly intact with no focal deficits. Rommberg sign and cerebellar reflexes not assessed.  Psychiatric: Normal judgment and insight. Alert and oriented x 3. Normal mood and appropriate affect.   Data Reviewed: I have personally reviewed following labs and imaging studies  CBC: Recent Labs  Lab 01/16/20 0716 01/16/20 0716 01/16/20 1910 01/17/20 0629 01/18/20 0454 01/19/20 0530 01/20/20 0521  WBC 13.2*   < > 14.8* 11.8* 10.2 11.0* 9.9  NEUTROABS 10.8*  --   --  9.0* 6.6 6.8 5.8  HGB 9.8*   < > 10.0* 9.9* 11.0* 10.9* 10.7*  HCT 29.4*   < > 29.7* 29.7* 31.8* 32.4* 32.7*  MCV 98.7   < > 97.1 96.1 94.4 96.1 96.7  PLT 258   < > 288 295 321 362 393   < > = values in this interval not displayed.   Basic Metabolic Panel: Recent Labs  Lab 01/16/20 0716 01/16/20 0716 01/17/20 0629 01/18/20 0454 01/18/20 1513 01/19/20 0530 01/20/20 0521  NA 136  --  139 138  --  132* 136  K 3.5   < > 2.7* 2.9* 3.6 4.5 3.9  CL 100  --  95* 94*  --  95* 101  CO2 30  --  30 33*  --  26 27  GLUCOSE 109*  --  123* 110*  --  98 123*  BUN 14  --  10 11  --  12 6*  CREATININE 0.54  --  0.54 0.49  --  0.48 0.38*  CALCIUM 8.3*  --  8.3* 8.3*  --  8.1* 8.0*  MG 2.0   < > 1.6* 1.9 1.9 1.8 1.6*  PHOS 2.7  --  3.7 3.5  --  3.5 3.4   < > = values in this interval not displayed.   GFR: Estimated Creatinine Clearance: 64.8 mL/min (A) (by C-G formula based on SCr of 0.38 mg/dL (L)). Liver Function Tests: Recent Labs  Lab 01/16/20 0716 01/17/20 0629 01/18/20 0454 01/19/20 0530 01/20/20 0521  AST 24 47* 62* 38 34  ALT 17 36 45* 40 39  ALKPHOS 44 62 83 88 75  BILITOT 1.0 1.4* 1.5* 1.4* 1.1  PROT 5.5* 5.5* 5.6* 5.5* 5.1*  ALBUMIN  2.6* 2.4* 2.5* 2.4* 2.2*   Recent Labs  Lab 01/13/20 2331  LIPASE 23   No results for  input(s): AMMONIA in the last 168 hours. Coagulation Profile: Recent Labs  Lab 01/14/20 0950  INR 1.1   Cardiac Enzymes: No results for input(s): CKTOTAL, CKMB, CKMBINDEX, TROPONINI in the last 168 hours. BNP (last 3 results) No results for input(s): PROBNP in the last 8760 hours. HbA1C: No results for input(s): HGBA1C in the last 72 hours. CBG: Recent Labs  Lab 01/19/20 1631 01/19/20 2011 01/19/20 2356 01/20/20 0404 01/20/20 0748  GLUCAP 158* 184* 114* 103* 122*   Lipid Profile: No results for input(s): CHOL, HDL, LDLCALC, TRIG, CHOLHDL, LDLDIRECT in the last 72 hours. Thyroid Function Tests: No results for input(s): TSH, T4TOTAL, FREET4, T3FREE, THYROIDAB in the last 72 hours. Anemia Panel: No results for input(s): VITAMINB12, FOLATE, FERRITIN, TIBC, IRON, RETICCTPCT in the last 72 hours. Sepsis Labs: Recent Labs  Lab 01/14/20 0057  LATICACIDVEN 1.6    Recent Results (from the past 240 hour(s))  Blood Culture (routine x 2)     Status: None   Collection Time: 01/14/20 12:08 AM   Specimen: BLOOD RIGHT HAND  Result Value Ref Range Status   Specimen Description   Final    BLOOD RIGHT HAND Performed at [facility] [address] [place], [place] [address]    Special Requests   Final    BOTTLES DRAWN AEROBIC AND ANAEROBIC Blood Culture adequate volume Performed at [facility] [address], [place], [place] [address]    Culture   Final    NO GROWTH 5 DAYS Performed at [facility], [place] [address] [place], [place] [address]    Report Status 01/19/2020 FINAL  Final  Blood Culture (routine x 2)     Status: None   Collection Time: 01/14/20 12:57 AM   Specimen: BLOOD  Result Value Ref Range Status   Specimen Description   Final    BLOOD RIGHT ANTECUBITAL Performed at [facility] [address], [place], [place] [address]    Special Requests   Final    BOTTLES DRAWN AEROBIC AND ANAEROBIC Blood  Culture adequate volume Performed at [facility] [address], [place], [place] [address]    Culture   Final    NO GROWTH 5 DAYS Performed at [facility], [place] [address], [place], [place] [address]    Report Status 01/19/2020 FINAL  Final  SARS Coronavirus 2 by RT PCR (hospital order, performed in [facility] hospital lab) Nasopharyngeal Nasopharyngeal Swab     Status: None   Collection Time: 01/14/20  3:23 AM   Specimen: Nasopharyngeal Swab  Result Value Ref Range Status   SARS Coronavirus 2 NEGATIVE NEGATIVE Final    Comment: (NOTE) SARS-CoV-2 target nucleic acids are NOT DETECTED. The SARS-CoV-2 RNA is generally detectable in upper and lower respiratory specimens during the acute phase of infection. The lowest concentration of SARS-CoV-2 viral copies this assay can detect is 250 copies / mL. A negative result does not preclude SARS-CoV-2 infection and should not be used as the sole basis for treatment or other patient management decisions.  A negative result may occur with improper specimen collection / handling, submission of specimen other than nasopharyngeal swab, presence of viral mutation(s) within the areas targeted by this assay, and inadequate number of viral copies (<250 copies / mL). A negative result must be combined with clinical observations, patient  history, and epidemiological information. Fact Sheet for Patients:   StrictlyIdeas.no Fact Sheet for Healthcare Providers: BankingDealers.co.za This test is not yet approved or cleared  by the [place] FDA and has been authorized for detection and/or diagnosis of SARS-CoV-2 by FDA under an Emergency Use Authorization (EUA).  This EUA will remain in effect (meaning this test can be used) for the duration of the COVID-19 declaration under Section 564(b)(1) of the Act, 21 U.S.C. section 360bbb-3(b)(1), unless the authorization is terminated  or revoked sooner. Performed at [facility], [place] [address] [place], [place] [address]     RN Pressure Injury Documentation:     Estimated body mass index is 25.19 kg/m as calculated from the following:   Height as of this encounter: 5\' 6"  (1.676 m).   Weight as of this encounter: 70.8 kg.  Malnutrition Type:      Malnutrition Characteristics:      Nutrition Interventions:      Radiology Studies: No results found.  Scheduled Meds: . acetaminophen  650 mg Oral Q6H  . chlorhexidine  15 mL Mouth Rinse BID  . clopidogrel  75 mg Oral Q breakfast  . dextromethorphan-guaiFENesin  1 tablet Oral BID  . enoxaparin (LOVENOX) injection  40 mg Subcutaneous Q24H  . ezetimibe  10 mg Oral Daily  . mouth rinse  15 mL Mouth Rinse BID  . mouth rinse  15 mL Mouth Rinse q12n4p  . methocarbamol  500 mg Oral TID  . metoprolol tartrate  5 mg Intravenous Q6H  . pantoprazole (PROTONIX) IV  40 mg Intravenous QHS  . rosuvastatin  10 mg Oral QHS  . sodium chloride flush  3 mL Intravenous Q12H   Continuous Infusions: . sodium chloride    . chlorproMAZINE (THORAZINE) IV      LOS: 6 days   Kerney Elbe, DO [facility] PAGER is on AMION  If 7PM-7AM, please contact night-coverage [email or web address]

## 2020-01-20 NOTE — Evaluation (Signed)
Physical Therapy Evaluation Patient Details Name: Margaret Kelly MRN: [place]:[id number] DOB: 1951/08/30 Today's Date: 01/20/2020   History of Present Illness  69 yo female s/p ileocolectomy 5/13  Clinical Impression  On eval, pt was Min guard assist for mobility. She walked ~115 feet around the unit. Moderate abd pain with activity. Ambulation distance limited by bowel incontinence. Discussed d/c plan-pt plans to return home where she will have assistance from a friend. She politely declines HHPT and RW. Recommend daily ambulation in hallway with nursing in addition to PT session.     Follow Up Recommendations Supervision/Assistance - 24 hour(pt declines HHPT)    Equipment Recommendations  None recommended by PT(pt declines RW)    Recommendations for Other Services       Precautions / Restrictions Precautions Precautions: Fall Restrictions Weight Bearing Restrictions: No      Mobility  Bed Mobility Overal bed mobility: Needs Assistance Bed Mobility: Supine to Sit;Sit to Supine     Supine to sit: Supervision;HOB elevated Sit to supine: Supervision;HOB elevated   General bed mobility comments: for safety, lines  Transfers Overall transfer level: Needs assistance Equipment used: None Transfers: Sit to/from Stand Sit to Stand: Min guard         General transfer comment: min guard for safety.  Ambulation/Gait Ambulation/Gait assistance: Min guard Gait Distance (Feet): 115 Feet Assistive device: IV Pole Gait Pattern/deviations: Step-through pattern;Decreased stride length;Trunk flexed     General Gait Details: Pt walked with 1 hand vs 2 hand support. Dyspnea 2/4. Distance limited by bowel incontinence.  Stairs            Wheelchair Mobility    Modified Rankin (Stroke Patients Only)       Balance Overall balance assessment: Needs assistance           Standing balance-Leahy Scale: Fair                               Pertinent Vitals/Pain  Pain Assessment: 0-10 Pain Score: 5  Pain Location: abdomen Pain Descriptors / Indicators: Discomfort;Sore Pain Intervention(s): Monitored during session;Repositioned    Home Living Family/patient expects to be discharged to:: Private residence Living Arrangements: Non-relatives/Friends Available Help at Discharge: Friend(s) Type of Home: House Home Access: Stairs to enter Entrance Stairs-Rails: Psychiatric nurse of Steps: 3 Home Layout: One level Home Equipment: Cane - single point      Prior Function Level of Independence: Independent               Hand Dominance        Extremity/Trunk Assessment   Upper Extremity Assessment Upper Extremity Assessment: Generalized weakness    Lower Extremity Assessment Lower Extremity Assessment: Generalized weakness    Cervical / Trunk Assessment Cervical / Trunk Assessment: Normal  Communication   Communication: No difficulties  Cognition Arousal/Alertness: Awake/alert Behavior During Therapy: WFL for tasks assessed/performed Overall Cognitive Status: Within Functional Limits for tasks assessed                                        General Comments      Exercises     Assessment/Plan    PT Assessment Patient needs continued PT services  PT Problem List Decreased mobility;Decreased balance;Decreased activity tolerance;Pain;Decreased strength       PT Treatment Interventions DME instruction;Gait training;Therapeutic activities;Therapeutic exercise;Patient/family education;Functional mobility training;Balance  training    PT Goals (Current goals can be found in the Care Plan section)  Acute Rehab PT Goals Patient Stated Goal: home soon. regain independence PT Goal Formulation: With patient Time For Goal Achievement: 02/03/20 Potential to Achieve Goals: Good    Frequency Min 3X/week   Barriers to discharge        Co-evaluation               AM-PAC PT "6 Clicks"  Mobility  Outcome Measure Help needed turning from your back to your side while in a flat bed without using bedrails?: A Little Help needed moving from lying on your back to sitting on the side of a flat bed without using bedrails?: A Little Help needed moving to and from a bed to a chair (including a wheelchair)?: A Little Help needed standing up from a chair using your arms (e.g., wheelchair or bedside chair)?: A Little Help needed to walk in hospital room?: A Little Help needed climbing 3-5 steps with a railing? : A Little 6 Click Score: 18    End of Session   Activity Tolerance: Patient tolerated treatment well Patient left: in bed;with call bell/phone within reach;with bed alarm set   PT Visit Diagnosis: Pain;Muscle weakness (generalized) (M62.81);Unsteadiness on feet (R26.81) Pain - part of body: (abdomen)    Time: [id number] PT Time Calculation (min) (ACUTE ONLY): 17 min   Charges:   PT Evaluation $PT Eval Low Complexity: 1 Low            Daaron Dimarco P, PT Acute Rehabilitation

## 2020-01-20 NOTE — Clinical Social Work Note (Signed)
    Durable Medical Equipment  (From admission, onward)         Start     Ordered   01/20/20 0920  For home use only DME Hospital bed  Once    Question Answer Comment  Length of Need 6 Months   Patient has (list medical condition): s/p abdominal surgery   The above medical condition requires: Patient requires the ability to reposition frequently   Bed type Semi-electric      01/20/20 0920   01/20/20 0913  For home use only DME Negative pressure wound device  Once    Question Answer Comment  Frequency of dressing change 3 times per week   Length of need 3 Months   Dressing type Foam   Amount of suction 125 mm/Hg   Pressure application Continuous pressure   Supplies 10 canisters and 15 dressings per month for duration of therapy      01/20/20 0912

## 2020-01-21 ENCOUNTER — Encounter: Payer: Self-pay | Admitting: *Deleted

## 2020-01-21 LAB — CBC WITH DIFFERENTIAL/PLATELET
Abs Immature Granulocytes: 0.39 10*3/uL — ABNORMAL HIGH (ref 0.00–0.07)
Basophils Absolute: 0.1 10*3/uL (ref 0.0–0.1)
Basophils Relative: 1 %
Eosinophils Absolute: 0.4 10*3/uL (ref 0.0–0.5)
Eosinophils Relative: 4 %
HCT: 31 % — ABNORMAL LOW (ref 36.0–46.0)
Hemoglobin: 10.5 g/dL — ABNORMAL LOW (ref 12.0–15.0)
Immature Granulocytes: 4 %
Lymphocytes Relative: 26 %
Lymphs Abs: 2.7 10*3/uL (ref 0.7–4.0)
MCH: 32.7 pg (ref 26.0–34.0)
MCHC: 33.9 g/dL (ref 30.0–36.0)
MCV: 96.6 fL (ref 80.0–100.0)
Monocytes Absolute: 1.3 10*3/uL — ABNORMAL HIGH (ref 0.1–1.0)
Monocytes Relative: 12 %
Neutro Abs: 5.5 10*3/uL (ref 1.7–7.7)
Neutrophils Relative %: 53 %
Platelets: 402 10*3/uL — ABNORMAL HIGH (ref 150–400)
RBC: 3.21 MIL/uL — ABNORMAL LOW (ref 3.87–5.11)
RDW: 12.5 % (ref 11.5–15.5)
WBC: 10.3 10*3/uL (ref 4.0–10.5)
nRBC: 0 % (ref 0.0–0.2)

## 2020-01-21 LAB — COMPREHENSIVE METABOLIC PANEL
ALT: 33 U/L (ref 0–44)
AST: 24 U/L (ref 15–41)
Albumin: 2.3 g/dL — ABNORMAL LOW (ref 3.5–5.0)
Alkaline Phosphatase: 65 U/L (ref 38–126)
Anion gap: 7 (ref 5–15)
BUN: <5 mg/dL — ABNORMAL LOW (ref 8–23)
CO2: 24 mmol/L (ref 22–32)
Calcium: 7.8 mg/dL — ABNORMAL LOW (ref 8.9–10.3)
Chloride: 104 mmol/L (ref 98–111)
Creatinine, Ser: 0.47 mg/dL (ref 0.44–1.00)
GFR calc Af Amer: >60 mL/min (ref >60)
GFR calc non Af Amer: >60 mL/min (ref >60)
Glucose, Bld: 120 mg/dL — ABNORMAL HIGH (ref 70–99)
Potassium: 3.3 mmol/L — ABNORMAL LOW (ref 3.5–5.1)
Sodium: 135 mmol/L (ref 135–145)
Total Bilirubin: 0.5 mg/dL (ref 0.3–1.2)
Total Protein: 5.2 g/dL — ABNORMAL LOW (ref 6.5–8.1)

## 2020-01-21 LAB — MAGNESIUM: Magnesium: 1.7 mg/dL (ref 1.7–2.4)

## 2020-01-21 LAB — PHOSPHORUS: Phosphorus: 3.8 mg/dL (ref 2.5–4.6)

## 2020-01-21 MED ORDER — POTASSIUM CHLORIDE CRYS ER 20 MEQ PO TBCR
40.0000 meq | EXTENDED_RELEASE_TABLET | Freq: Two times a day (BID) | ORAL | Status: AC
  Administered 2020-01-21 (×2): 40 meq via ORAL
  Filled 2020-01-21 (×2): qty 2

## 2020-01-21 MED ORDER — LOSARTAN POTASSIUM 50 MG PO TABS
100.0000 mg | ORAL_TABLET | Freq: Every day | ORAL | Status: DC
  Administered 2020-01-22: 100 mg via ORAL
  Filled 2020-01-21: qty 2

## 2020-01-21 MED ORDER — MAGNESIUM SULFATE 2 GM/50ML IV SOLN
2.0000 g | Freq: Once | INTRAVENOUS | Status: AC
  Administered 2020-01-21: 2 g via INTRAVENOUS
  Filled 2020-01-21: qty 50

## 2020-01-21 NOTE — TOC Progression Note (Addendum)
Transition of Care [facility]) - Progression Note    Patient Details  Name: Margaret Kelly MRN: [id number] Date of Birth: [birth year]  Transition of [facility]) CM/SW Contact  Servando Snare, [place] Phone Number: 01/21/2020, 11:16 AM  Clinical Narrative:     [facility] [place] form for wound vacc. Awaiting response for Hospital bed. Pt still needs HHRN.   12:38 PM  There are no hospital beds in stock. LCSW notified PA via secure chat. Pt still needs HHRN. HHRN has been difficult to locate due to frequency and RN staffing at [facility] agencies in addition to patients insurance.         Expected Discharge Plan and Services                                                 Social Determinants of Health (SDOH) Interventions    Readmission Risk Interventions No flowsheet data found.

## 2020-01-21 NOTE — Discharge Summary (Signed)
Physician Discharge Summary  Patient ID: Margaret Kelly MRN: [id number] DOB/AGE: [birth year] 67 y.o.  Admit date: 01/13/2020 Discharge date: 01/22/2020  Discharge Diagnoses Cecal volvulus Post-operative ileus, resolved HTN HLD PAD Anxiety Tobacco Abuse  Consultants Internal medicine Gastroenterology   Procedures 1. Flexible sigmoidoscopy - 01/14/20 Dr. Wilfrid Lund 2. Exploratory laparotomy, ileocecectomy - 01/14/20 Dr. Michael Boston  HPI: Patient is a 69 year old female with abdominal pain for 8 days. She was seen on 01/09/2020 with complaints of constipation. CT scan at that time showed no acute abnormalities. There was nonspecific thickening of the distal rectum/anus which might represent soft tissue mass or internal hemorrhoids. There were no acute abnormalities and there was an average stool burden throughout the colon. Patient was treated with suppositories and discharged. Patient reported she continued to have progressive pain. She had taken Tylenol without any relief. She had some very small watery stools in the 2 days PTA after taking Dulcolax suppositories and continued to feel constipated. She had some nausea but no vomiting. She was seen in February by her PCP and [facility] with abdominal pain she has a history of a 10 mm hyperplastic polyp that was removed in 2015, internal hemorrhoids, rectal prolapse and diverticulosis. She was found to have mild focal inflammatory changes left lower quadrant near the descending/proximal sigmoid colon with mild colon wall thickening thought to be secondary to acute diverticulitis. She was treated for diverticulitis and underwent colonoscopy on 11/10/2019, by Dr. Lucio Edward. She was worked up again 5/13 and thought to have a sigmoid volvulus.   Hospital Course: Surgery and GI were both consulted and patient was admitted to internal medicine. Patient was taken to endoscopy with GI for flexible sigmoidoscopy to try and decompress. Patient was unable  to be decompressed and was taken to the OR emergently. Intra-operatively patient was noted to actually have a cecal volvulus rather than sigmoid volvulus and she underwent ileocecectomy. At that time it was decided that surgery would be attending provider and internal medicine would consult. Patient developed expected post-operative ileus and was treated with NGT decompression and bowel rest. Ileus resolved and diet was advanced as tolerated. Antibiotics stopped on POD#5 and patient remained afebrile and without signs of infection. On 01/22/20, patient was tolerating a diet, having bowel function, VSS, pain well controlled, and overall felt stable for discharge home. She will follow up in surgical office in 3 weeks and is aware to call with questions or concerns.   PE: General: pleasant,WD, WN female who is laying in bed in NAD Heart: regular, rate, and rhythm. Normal s1,s2. No obvious murmurs, gallops, or rubs noted. Palpable radial and pedal pulses bilaterally Lungs: CTAB, no wheezes, rhonchi, or rales noted. Respiratory effort nonlabored Abd: soft, NT, ND,+BS, midline wound with VAC present  MS: all 4 extremities are symmetrical with no cyanosis, clubbing, or edema. Skin: warm and dry with no masses, lesions, or rashes  I or a member of my team have reviewed this patient in the Controlled Substance Database   Allergies as of 01/22/2020      Reactions   Amlodipine Other (See Comments)   Ringing in ears      Medication List    STOP taking these medications   belladonna-opium 16.2-30 MG suppository Commonly known as: B&O SUPPRETTES   MAGNESIUM CITRATE PO     TAKE these medications   acetaminophen 500 MG tablet Commonly known as: TYLENOL Take 1,000 mg by mouth every 6 (six) hours as needed for mild pain.  aspirin EC 81 MG tablet Take 81 mg by mouth daily.   carvedilol 25 MG tablet Commonly known as: COREG TAKE 1 TABLET(25 MG) BY MOUTH TWICE DAILY What changed: See the new  instructions.   cilostazol 50 MG tablet Commonly known as: PLETAL TAKE 1 TABLET(50 MG) BY MOUTH TWICE DAILY What changed: See the new instructions.   clopidogrel 75 MG tablet Commonly known as: PLAVIX Take 1 tablet (75 mg total) by mouth daily with breakfast.   cyclobenzaprine 10 MG tablet Commonly known as: FLEXERIL Take 1 tablet (10 mg total) by mouth at bedtime as needed. What changed: reasons to take this   ezetimibe 10 MG tablet Commonly known as: ZETIA TAKE 1 TABLET(10 MG) BY MOUTH DAILY What changed: See the new instructions.   Fish Oil + D3 1200-1000 MG-UNIT Caps Take 1 capsule by mouth daily.   hydrocortisone 25 MG suppository Commonly known as: ANUSOL-HC Place 1 suppository (25 mg total) rectally 2 (two) times daily.   losartan 100 MG tablet Commonly known as: COZAAR TAKE 1 TABLET BY MOUTH DAILY   Melatonin 12 MG Tabs Take 12 mg by mouth at bedtime.   multivitamin with minerals Tabs tablet Take 1 tablet by mouth daily. Women's One-A-Day   oxyCODONE 5 MG immediate release tablet Commonly known as: Oxy IR/ROXICODONE Take 1-2 tablets (5-10 mg total) by mouth every 6 (six) hours as needed for moderate pain or severe pain.   pantoprazole 40 MG tablet Commonly known as: PROTONIX Take 1 tablet (40 mg total) by mouth daily.   rosuvastatin 10 MG tablet Commonly known as: CRESTOR Take 10 mg by mouth at bedtime.            Durable Medical Equipment  (From admission, onward)         Start     Ordered   01/20/20 1618  For home use only DME 3 n 1  Once     01/20/20 1617   01/20/20 0920  For home use only DME Hospital bed  Once    Question Answer Comment  Length of Need 6 Months   Patient has (list medical condition): s/p abdominal surgery   The above medical condition requires: Patient requires the ability to reposition frequently   Bed type Semi-electric      01/20/20 0920   01/20/20 0913  For home use only DME Negative pressure wound device  Once     Question Answer Comment  Frequency of dressing change 3 times per week   Length of need 3 Months   Dressing type Foam   Amount of suction 125 mm/Hg   Pressure application Continuous pressure   Supplies 10 canisters and 15 dressings per month for duration of therapy      01/20/20 0912           Follow-up Information    Michael Boston, MD. Go on 02/15/2020.   Specialty: General Surgery Why: Follow up appointment scheduled for 10:15 AM. Please arrive 30 min prior to appointment time. Bring photo ID and insurance information.  Contact information: [address] [place] [address] [phone]           Signed: Norm Parcel , [facility] 01/22/2020, 10:43 AM Please see Amion for pager number during day hours 7:00am-4:30pm

## 2020-01-21 NOTE — Progress Notes (Signed)
PROGRESS NOTE    Margaret Kelly  [id number] DOB: 1951-01-21 DOA: 01/13/2020 PCP: Dorena Dew, FNP   Brief Narrative:  History of Present Illness: The patient is a 69 year old Caucasian female with a past medical history significant for but not limited to Anxiety, HTN, PAD, HLD, nerve damage left side secondary to fall, LEFT hip pain, Hx hepatitis C. tobacco abuse.  She is a patient of Dr. Lucio Edward with lumbar GI who presented to the emergency room with severe generalized abdominal pain and inability to move her bowels or expel gas for the last 2 days.  She had been suffering from constipation on a regular basis and had a colonoscopy with Dr. Fuller Plan in March of this year.  And over the last few days she has increasing abdominal pain, bloating and had several calls with the GI clinic.  Recommendations were given and she is advised to come to the emergency department and on May 10 she presented when she was in severe pain.  CT scan showed a volvulus at 3 in the morning.  She underwent urgent sigmoidoscopy as her volvulus was not fully decompressed and general surgery was consulted.  Because she was only partially decompressed and continues have significant abdominal pain she underwent emergent operation by surgery.  After her surgery, internal medicine was consulted for assistance with management of her medical comorbidities   5/14 afebrile overnight, A/O x4, negative S OB.  Positive abdominal pain especially when she coughs.  Nausea controlled on current regimen.  Abdominal pain not controlled on current regimen  5/15 afebrile overnight A/O x4, negative S OB.  States overnight increased abdominal pain so started on PCA pump.  Negative nausea.  ADDENDUM; NG tube began to suction frank blood, exam him wound VAC was occluded and blood pooling.  5/16 A/O x4 patient states abdominal pain has decreased, surgery came by last night as well patient restarted suction on NG tube today no further  bright red blood per suction tube.  Patient states nausea improved and much more comfortable.  5/17 A/O x4, positive abdominal pain but significantly decreased since she had BM today.,  Negative SOB, negative CP.  5/18 A/O x4, minimal abdominal pain.  Ambulated up and down hallway x1 without any difficulty with breathing, abdominal pain, CP.  States negative postprandial nausea/vomiting  01/20/2020 she is improving and not been as much pain.  States that she received pain medication and is comfortable currently.  General surgery is planning on discharging the patient tomorrow She has advanced to soft  01/21/20: He is still doing well and pain is under control.  Still waiting for home health to be set up and has not yet tried a soft diet yet.  Medically she is doing well and blood pressure is better controlled.   Assessment & Plan:   Principal Problem:   Cecal volvulus s/p ileocolectomy 01/14/2020 Active Problems:   Essential hypertension   Hyperlipidemia LDL goal <100   History of hepatitis C   Pain of left hip joint   Peripheral arterial disease ([place])   Long term current use of anticoagulant therapy   Nerve damage   GERD (gastroesophageal reflux disease)   Blood transfusion without reported diagnosis   Anxiety   Bowel obstruction (H[facility])   Sigmoid volvulus ([place])  Essential HTN -On 5/15 started Metoprolol IV 5 mg QID hold for MAP<65 and will stop scheduled -On 5/16 Furosemide 60 mg x 1 -On 5/16 Hydralazine 7 mg PRN and will increase to 10 mg  q6prn  -5/18 BP starting to trend up we will add back some of her medication.   -5/18 Coreg 12.5 mg BID (1/2 of home regimen) and today will go to full dose of 25 mg po BID -Resumed Losartan 50 mg po Daily and will go to full dose now  -BP this AM was 155/80 -Continue to Monitor BP per Protocol   PAD -C/w Clopidogrel 75 mg daily  HLD -C/w Ezetimbie 10 mg daily -Rosuvastatin 10 mg daily   Post-Operative Abdominal Pain  -PCA pump  per surgery; Now D/C'd and also has hydromorphone 0.5 mg IV every 3 hours as needed for breakthrough -C/w acetaminophen-650 mg p.o. every 6h scheduled as well as oxycodone IR 5-10 mg p.o. every 4 hours as needed for moderate pain  GERD -Continue with Pantoprazole 40 mg IV nightly  Anxiety -C/w Lorazepam 1-2 mg q6hprn Anxiety   Tobacco Abuse -Smoking Cessation given  -C/w Nicotine 21 mg TD Patch   Sigmoid volvulus/Bowel obstruction -5/13 s/p ex lap see above -5/15 RN instructed to discontinue NG suction secondary to frank blood being suctioned, increasing abdominal pain.  Surgery paged to bedside -5/18 minimal discharge from wound VAC. -Management per Surgery -[place] nurse consulted   Refractory Nausea -C/w IV Zofran PRN; Change to po if necessary -Thorazine PRN if Zofran fails to alleviate nausea -Nausea has improved  Hypokalemia -Potassium goal> 4 -K+ this AM was 3.3 -Replete with po KCl 40 mEQ BID x2 -Continue to Monitor and Replete as Necessary -Repeat CMP in the AM   Hypomagnesmia -Magnesium goal> 2 -Mag Level this AM was 1.7 -Replete with IV Mag Sulfate 2 grams again  -Continue to Monitor and Replete as Necessary -Repeat Mag Level in the AM   Normocytic Anemia -Patient's Hb/Hct is relatively stable and went from 10.7/32.7 -> 10.5/31.0 -Check Anemia Panel in the outpatient setting  -Continue to Monitor for S/Sx of Bleeding; No overt bleeding noted -Repeat CBC within 1 week   DVT prophylaxis: Enoxaparin 40 mg sq q24h  Code Status: FULL CODE  Family Communication: No family present at bedside  Disposition Plan: Per Primary; Anticipating D/C home either later today or tomorrow   Consultants:   [facility] (Primary)  Gastroenterology   Procedures:  Sigmoidoscopy  Procedure by Dr. Johney Maine on 01/14/20 EXPLORATORY LAPAROTOMY  ILEOCOLECTOMY TAP BLOCK - BILATERAL  Antimicrobials:  Anti-infectives (From admission, onward)   Start     Dose/Rate  Route Frequency Ordered Stop   01/14/20 1730  piperacillin-tazobactam (ZOSYN) IVPB 3.375 g     3.375 g 12.5 mL/hr over 240 Minutes Intravenous Every 8 hours 01/14/20 1723 01/19/20 1239   01/14/20 1001  sodium chloride 0.9 % with cefoTEtan (CEFOTAN) ADS Med    Note to Pharmacy: Jefm Miles   : cabinet override      01/14/20 1001 01/14/20 1059   01/14/20 0945  cefoTEtan (CEFOTAN) 2 g in sodium chloride 0.9 % 100 mL IVPB     2 g 200 mL/hr over 30 Minutes Intravenous On call to O.R. 01/14/20 0941 01/14/20 1040   01/14/20 0015  cefTRIAXone (ROCEPHIN) 2 g in sodium chloride 0.9 % 100 mL IVPB     2 g 200 mL/hr over 30 Minutes Intravenous  Once 01/14/20 0008 01/14/20 0158   01/14/20 0015  metroNIDAZOLE (FLAGYL) IVPB 500 mg     500 mg 100 mL/hr over 60 Minutes Intravenous  Once 01/14/20 0008 01/14/20 0243     Subjective: And examined at bedside and she is feeling  okay.  Denies any chest pain, lightheadedness or dizziness.  No nausea or vomiting.  States that she had a rough night and was unable to fall asleep and has been up since 2 AM.  No other concerns or complaints at this time.  Objective: Vitals:   01/20/20 0741 01/20/20 1459 01/20/20 2116 01/21/20 0530  BP:  139/69 (!) 175/84 (!) 155/80  Pulse:  86 85 76  Resp:  18 18 16   Temp:  97.9 F (36.6 C) 98 F (36.7 C) 97.9 F (36.6 C)  TempSrc:  Oral    SpO2: 94% 98% 99% 97%  Weight:      Height:        Intake/Output Summary (Last 24 hours) at 01/21/2020 1140 Last data filed at 01/21/2020 1100 Gross per 24 hour  Intake 1325 ml  Output -  Net 1325 ml   Filed Weights   01/14/20 0736 01/14/20 0957 01/14/20 1006  Weight: 70.8 kg 70.8 kg 70.8 kg   Examination: Physical Exam:  Constitutional: WN/WD female currently in no acute distress appears calm and does appear comfortable Eyes: Lids and conjunctivae normal, sclerae anicteric  ENMT: External Ears, Nose appear normal. Grossly normal hearing.  Neck: Appears normal, supple, no  cervical masses, normal ROM, no appreciable thyromegaly; no JVD Respiratory: Diminished to auscultation bilaterally, no wheezing, rales, rhonchi or crackles. Normal respiratory effort and patient is not tachypenic. No accessory muscle use.  Unlabored breathing Cardiovascular: RRR, no murmurs / rubs / gallops. S1 and S2 auscultated. No extremity edema. Abdomen: Soft, slightly-tender, mildly distended secondary to body habitus and she has a midline wound VAC in place. Bowel sounds positive.  GU: Deferred. Musculoskeletal: No clubbing / cyanosis of digits/nails. No induration; Warm and dry.  Neurologic: CN 2-12 grossly intact with no focal deficits. Romberg sign and cerebellar reflexes not assessed.  Psychiatric: Normal judgment and insight. Alert and oriented x 3. Normal mood and appropriate affect.   Data Reviewed: I have personally reviewed following labs and imaging studies  CBC: Recent Labs  Lab 01/17/20 0629 01/18/20 0454 01/19/20 0530 01/20/20 0521 01/21/20 0605  WBC 11.8* 10.2 11.0* 9.9 10.3  NEUTROABS 9.0* 6.6 6.8 5.8 5.5  HGB 9.9* 11.0* 10.9* 10.7* 10.5*  HCT 29.7* 31.8* 32.4* 32.7* 31.0*  MCV 96.1 94.4 96.1 96.7 96.6  PLT 295 321 362 393 [id number]   Basic Metabolic Panel: Recent Labs  Lab 01/17/20 0629 01/17/20 0629 01/18/20 0454 01/18/20 1513 01/19/20 0530 01/20/20 0521 01/21/20 0605  NA 139  --  138  --  132* 136 135  K 2.7*   < > 2.9* 3.6 4.5 3.9 3.3*  CL 95*  --  94*  --  95* 101 104  CO2 30  --  33*  --  26 27 24   GLUCOSE 123*  --  110*  --  98 123* 120*  BUN 10  --  11  --  12 6* <5*  CREATININE 0.54  --  0.49  --  0.48 0.38* 0.47  CALCIUM 8.3*  --  8.3*  --  8.1* 8.0* 7.8*  MG 1.6*   < > 1.9 1.9 1.8 1.6* 1.7  PHOS 3.7  --  3.5  --  3.5 3.4 3.8   < > = values in this interval not displayed.   GFR: Estimated Creatinine Clearance: 64.8 mL/min (by C-G formula based on SCr of 0.47 mg/dL). Liver Function Tests: Recent Labs  Lab 01/17/20 [id number] 01/18/20 0454  01/19/20 0530 01/20/20 [id number] 01/21/20 [id number]  AST 47* 62* 38 34 24  ALT 36 45* 40 39 33  ALKPHOS 62 83 88 75 65  BILITOT 1.4* 1.5* 1.4* 1.1 0.5  PROT 5.5* 5.6* 5.5* 5.1* 5.2*  ALBUMIN 2.4* 2.5* 2.4* 2.2* 2.3*   No results for input(s): LIPASE, AMYLASE in the last 168 hours. No results for input(s): AMMONIA in the last 168 hours. Coagulation Profile: No results for input(s): INR, PROTIME in the last 168 hours. Cardiac Enzymes: No results for input(s): CKTOTAL, CKMB, CKMBINDEX, TROPONINI in the last 168 hours. BNP (last 3 results) No results for input(s): PROBNP in the last 8760 hours. HbA1C: No results for input(s): HGBA1C in the last 72 hours. CBG: Recent Labs  Lab 01/19/20 2011 01/19/20 2356 01/20/20 0404 01/20/20 0748 01/20/20 1659  GLUCAP 184* 114* 103* 122* 206*   Lipid Profile: No results for input(s): CHOL, HDL, LDLCALC, TRIG, CHOLHDL, LDLDIRECT in the last 72 hours. Thyroid Function Tests: No results for input(s): TSH, T4TOTAL, FREET4, T3FREE, THYROIDAB in the last 72 hours. Anemia Panel: No results for input(s): VITAMINB12, FOLATE, FERRITIN, TIBC, IRON, RETICCTPCT in the last 72 hours. Sepsis Labs: No results for input(s): PROCALCITON, LATICACIDVEN in the last 168 hours.  Recent Results (from the past 240 hour(s))  Blood Culture (routine x 2)     Status: None   Collection Time: 01/14/20 12:08 AM   Specimen: BLOOD RIGHT HAND  Result Value Ref Range Status   Specimen Description   Final    BLOOD RIGHT HAND Performed at [facility] [address] [place], [place] [address]    Special Requests   Final    BOTTLES DRAWN AEROBIC AND ANAEROBIC Blood Culture adequate volume Performed at [facility] [address] [place], [place] [address]    Culture   Final    NO GROWTH 5 DAYS Performed at [facility], [place] [address], [place], [place] [address]    Report Status 01/19/2020 FINAL  Final  Blood Culture (routine x  2)     Status: None   Collection Time: 01/14/20 12:57 AM   Specimen: BLOOD  Result Value Ref Range Status   Specimen Description   Final    BLOOD RIGHT ANTECUBITAL Performed at [facility] [address] [place], [place] [address]    Special Requests   Final    BOTTLES DRAWN AEROBIC AND ANAEROBIC Blood Culture adequate volume Performed at [facility] [address], [place], [place] [address]    Culture   Final    NO GROWTH 5 DAYS Performed at [facility], [place] [address] [place], [place] [address]    Report Status 01/19/2020 FINAL  Final  SARS Coronavirus 2 by RT PCR (hospital order, performed in [facility] hospital lab) Nasopharyngeal Nasopharyngeal Swab     Status: None   Collection Time: 01/14/20  3:23 AM   Specimen: Nasopharyngeal Swab  Result Value Ref Range Status   SARS Coronavirus 2 NEGATIVE NEGATIVE Final    Comment: (NOTE) SARS-CoV-2 target nucleic acids are NOT DETECTED. The SARS-CoV-2 RNA is generally detectable in upper and lower respiratory specimens during the acute phase of infection. The lowest concentration of SARS-CoV-2 viral copies this assay can detect is 250 copies / mL. A negative result does not preclude SARS-CoV-2 infection and should not be used as the sole basis for treatment or other patient management decisions.  A negative result may occur with improper specimen collection / handling, submission of specimen  other than nasopharyngeal swab, presence of viral mutation(s) within the areas targeted by this assay, and inadequate number of viral copies (<250 copies / mL). A negative result must be combined with clinical observations, patient history, and epidemiological information. Fact Sheet for Patients:   StrictlyIdeas.no Fact Sheet for Healthcare Providers: BankingDealers.co.za This test is not yet approved or cleared  by the [place] FDA and has  been authorized for detection and/or diagnosis of SARS-CoV-2 by FDA under an Emergency Use Authorization (EUA).  This EUA will remain in effect (meaning this test can be used) for the duration of the COVID-19 declaration under Section 564(b)(1) of the Act, 21 U.S.C. section 360bbb-3(b)(1), unless the authorization is terminated or revoked sooner. Performed at [facility], [place] [address], [place], [place] [address]     RN Pressure Injury Documentation:     Estimated body mass index is 25.19 kg/m as calculated from the following:   Height as of this encounter: 5\' 6"  (1.676 m).   Weight as of this encounter: 70.8 kg.  Malnutrition Type:      Malnutrition Characteristics:      Nutrition Interventions:      Radiology Studies: No results found.  Scheduled Meds: . acetaminophen  650 mg Oral Q6H  . carvedilol  25 mg Oral BID WC  . chlorhexidine  15 mL Mouth Rinse BID  . clopidogrel  75 mg Oral Q breakfast  . dextromethorphan-guaiFENesin  1 tablet Oral BID  . enoxaparin (LOVENOX) injection  40 mg Subcutaneous Q24H  . ezetimibe  10 mg Oral Daily  . losartan  50 mg Oral Daily  . mouth rinse  15 mL Mouth Rinse BID  . mouth rinse  15 mL Mouth Rinse q12n4p  . methocarbamol  500 mg Oral TID  . pantoprazole (PROTONIX) IV  40 mg Intravenous QHS  . potassium chloride  40 mEq Oral BID  . rosuvastatin  10 mg Oral QHS  . sodium chloride flush  3 mL Intravenous Q12H   Continuous Infusions: . sodium chloride    . chlorproMAZINE (THORAZINE) IV      LOS: 7 days   Kerney Elbe, DO [facility] PAGER is on AMION  If 7PM-7AM, please contact night-coverage [email or web address]

## 2020-01-21 NOTE — Progress Notes (Signed)
[facility] Progress Note  7 Days Post-Op  Subjective: Patient tolerating liquids but did not realize diet was advanced yesterday. Had some diarrhea but hopeful with solid food that stools will firm up some. Pain mostly with coughing or moving but ok at rest. Denies nausea. Working with CSW to ensure home health needs are addressed/set up.   Objective: Vital signs in last 24 hours: Temp:  [97.9 F (36.6 C)-98 F (36.7 C)] 97.9 F (36.6 C) (05/20 0530) Pulse Rate:  [76-86] 76 (05/20 0530) Resp:  [16-18] 16 (05/20 0530) BP: (139-175)/(69-84) 155/80 (05/20 0530) SpO2:  [97 %-99 %] 97 % (05/20 0530) Last BM Date: 01/20/20  Intake/Output from previous day: 05/19 0701 - 05/20 0700 In: 1205 [P.O.:1205] Out: -  Intake/Output this shift: No intake/output data recorded.  PE: General: pleasant,WD, WN female who is laying in bed in NAD Heart: regular, rate, and rhythm. Normal s1,s2. No obvious murmurs, gallops, or rubs noted. Palpable radial and pedal pulses bilaterally Lungs: CTAB, no wheezes, rhonchi, or rales noted. Respiratory effort nonlabored Abd: soft, NT, ND,+BS, midline wound with VAC present  MS: all 4 extremities are symmetrical with no cyanosis, clubbing, or edema. Skin: warm and dry with no masses, lesions, or rashes   Lab Results:  Recent Labs    01/20/20 0521 01/21/20 0605  WBC 9.9 10.3  HGB 10.7* 10.5*  HCT 32.7* 31.0*  PLT 393 402*   BMET Recent Labs    01/20/20 0521 01/21/20 0605  NA 136 135  K 3.9 3.3*  CL 101 104  CO2 27 24  GLUCOSE 123* 120*  BUN 6* <5*  CREATININE 0.38* 0.47  CALCIUM 8.0* 7.8*   PT/INR No results for input(s): LABPROT, INR in the last 72 hours. CMP     Component Value Date/Time   NA 135 01/21/2020 0605   NA 141 01/05/2020 1043   K 3.3 (L) 01/21/2020 0605   CL 104 01/21/2020 0605   CO2 24 01/21/2020 0605   GLUCOSE 120 (H) 01/21/2020 0605   BUN <5 (L) 01/21/2020 0605   BUN 14 01/05/2020 1043    CREATININE 0.47 01/21/2020 0605   CREATININE 0.78 03/22/2017 1116   CALCIUM 7.8 (L) 01/21/2020 0605   PROT 5.2 (L) 01/21/2020 0605   PROT 7.2 01/05/2020 1043   ALBUMIN 2.3 (L) 01/21/2020 0605   ALBUMIN 4.7 01/05/2020 1043   AST 24 01/21/2020 0605   ALT 33 01/21/2020 0605   ALKPHOS 65 01/21/2020 0605   BILITOT 0.5 01/21/2020 0605   BILITOT 0.4 01/05/2020 1043   GFRNONAA >60 01/21/2020 0605   GFRNONAA 81 03/22/2017 1116   GFRAA >60 01/21/2020 0605   GFRAA >89 03/22/2017 1116   Lipase     Component Value Date/Time   LIPASE 23 01/13/2020 2331       Studies/Results: No results found.  Anti-infectives: Anti-infectives (From admission, onward)   Start     Dose/Rate Route Frequency Ordered Stop   01/14/20 1730  piperacillin-tazobactam (ZOSYN) IVPB 3.375 g     3.375 g 12.5 mL/hr over 240 Minutes Intravenous Every 8 hours 01/14/20 1723 01/19/20 1239   01/14/20 1001  sodium chloride 0.9 % with cefoTEtan (CEFOTAN) ADS Med    Note to Pharmacy: Jefm Miles   : cabinet override      01/14/20 1001 01/14/20 1059   01/14/20 0945  cefoTEtan (CEFOTAN) 2 g in sodium chloride 0.9 % 100 mL IVPB     2 g 200 mL/hr over 30 Minutes Intravenous On call  to O.R. 01/14/20 0941 01/14/20 1040   01/14/20 0015  cefTRIAXone (ROCEPHIN) 2 g in sodium chloride 0.9 % 100 mL IVPB     2 g 200 mL/hr over 30 Minutes Intravenous  Once 01/14/20 0008 01/14/20 0158   01/14/20 0015  metroNIDAZOLE (FLAGYL) IVPB 500 mg     500 mg 100 mL/hr over 60 Minutes Intravenous  Once 01/14/20 0008 01/14/20 0243       Assessment/Plan HTN PAD HLD Anxiety  Tobacco abuse  Cecal volvulus  S/P exploratory laparotomy with ileocecectomy 01/14/20 Dr. Johney Maine - POD#7 -soft diet  - continue to mobilize - continue VAC m/w/f - placed [facility] for [facility] - home today vs tomorrow - need to touch base with CSW to find out if everything will be set up   [id number] diet, SLIV VTE: SCDs, lovenox ID: Zosyn 5/13>5/18  LOS: 7 days     Norm Parcel , [facility] Surgery 01/21/2020, 8:48 AM Please see Amion for pager number during day hours 7:00am-4:30pm

## 2020-01-21 NOTE — Care Management Important Message (Signed)
Important Message  Patient Details IM Letter given to Shari Heritage SW Case Manager to present to the Patient Name: Margaret Kelly MRN: [id number] Date of Birth: Aug 22, 1951   Medicare Important Message Given:  Yes     Kerin Salen 01/21/2020, 10:26 AM

## 2020-01-22 LAB — CBC WITH DIFFERENTIAL/PLATELET
Abs Immature Granulocytes: 0.36 10*3/uL — ABNORMAL HIGH (ref 0.00–0.07)
Basophils Absolute: 0.1 10*3/uL (ref 0.0–0.1)
Basophils Relative: 1 %
Eosinophils Absolute: 0.4 10*3/uL (ref 0.0–0.5)
Eosinophils Relative: 4 %
HCT: 29 % — ABNORMAL LOW (ref 36.0–46.0)
Hemoglobin: 9.7 g/dL — ABNORMAL LOW (ref 12.0–15.0)
Immature Granulocytes: 3 %
Lymphocytes Relative: 21 %
Lymphs Abs: 2.2 10*3/uL (ref 0.7–4.0)
MCH: 32.4 pg (ref 26.0–34.0)
MCHC: 33.4 g/dL (ref 30.0–36.0)
MCV: 97 fL (ref 80.0–100.0)
Monocytes Absolute: 1.2 10*3/uL — ABNORMAL HIGH (ref 0.1–1.0)
Monocytes Relative: 12 %
Neutro Abs: 6.4 10*3/uL (ref 1.7–7.7)
Neutrophils Relative %: 59 %
Platelets: 425 10*3/uL — ABNORMAL HIGH (ref 150–400)
RBC: 2.99 MIL/uL — ABNORMAL LOW (ref 3.87–5.11)
RDW: 13.1 % (ref 11.5–15.5)
WBC: 10.7 10*3/uL — ABNORMAL HIGH (ref 4.0–10.5)
nRBC: 0 % (ref 0.0–0.2)

## 2020-01-22 LAB — COMPREHENSIVE METABOLIC PANEL
ALT: 34 U/L (ref 0–44)
AST: 27 U/L (ref 15–41)
Albumin: 2.4 g/dL — ABNORMAL LOW (ref 3.5–5.0)
Alkaline Phosphatase: 58 U/L (ref 38–126)
Anion gap: 5 (ref 5–15)
BUN: 8 mg/dL (ref 8–23)
CO2: 24 mmol/L (ref 22–32)
Calcium: 7.9 mg/dL — ABNORMAL LOW (ref 8.9–10.3)
Chloride: 107 mmol/L (ref 98–111)
Creatinine, Ser: 0.57 mg/dL (ref 0.44–1.00)
GFR calc Af Amer: >60 mL/min (ref >60)
GFR calc non Af Amer: >60 mL/min (ref >60)
Glucose, Bld: 173 mg/dL — ABNORMAL HIGH (ref 70–99)
Potassium: 4 mmol/L (ref 3.5–5.1)
Sodium: 136 mmol/L (ref 135–145)
Total Bilirubin: 0.4 mg/dL (ref 0.3–1.2)
Total Protein: 5.3 g/dL — ABNORMAL LOW (ref 6.5–8.1)

## 2020-01-22 LAB — PHOSPHORUS: Phosphorus: 4 mg/dL (ref 2.5–4.6)

## 2020-01-22 LAB — MAGNESIUM: Magnesium: 1.5 mg/dL — ABNORMAL LOW (ref 1.7–2.4)

## 2020-01-22 MED ORDER — OXYCODONE HCL 5 MG PO TABS: 5.0000 mg | ORAL_TABLET | Freq: Four times a day (QID) | ORAL | 0 refills | Status: DC | PRN

## 2020-01-22 MED ORDER — HYDROXYZINE HCL 25 MG PO TABS
25.0000 mg | ORAL_TABLET | Freq: Four times a day (QID) | ORAL | Status: DC | PRN
  Administered 2020-01-22: 25 mg via ORAL
  Filled 2020-01-22: qty 1

## 2020-01-22 MED ORDER — MAGNESIUM SULFATE 50 % IJ SOLN
3.0000 g | Freq: Once | INTRAVENOUS | Status: AC
  Administered 2020-01-22: 3 g via INTRAVENOUS
  Filled 2020-01-22: qty 6

## 2020-01-22 NOTE — Discharge Instructions (Signed)
[facility]      [facility] [phone]  OPEN ABDOMINAL SURGERY: POST OP INSTRUCTIONS  Always review your discharge instruction sheet given to you by the facility where your surgery was performed.  IF YOU HAVE DISABILITY OR FAMILY LEAVE FORMS, YOU MUST BRING THEM TO THE OFFICE FOR PROCESSING.  PLEASE DO NOT GIVE THEM TO YOUR DOCTOR.  1. A prescription for pain medication may be given to you upon discharge.  Take your pain medication as prescribed, if needed.  If narcotic pain medicine is not needed, then you may take acetaminophen (Tylenol) or ibuprofen (Advil) as needed. 2. Take your usually prescribed medications unless otherwise directed. 3. If you need a refill on your pain medication, please contact your pharmacy. They will contact our office to request authorization.  Prescriptions will not be filled after 5pm or on week-ends. 4. You should follow a light diet the first few days after arrival home, such as soup and crackers, pudding, etc.unless your doctor has advised otherwise. A high-fiber, low fat diet can be resumed as tolerated.   Be sure to include lots of fluids daily. Most patients will experience some swelling and bruising on the chest and neck area.  Ice packs will help.  Swelling and bruising can take several days to resolve 5. Most patients will experience some swelling and bruising in the area of the incision. Ice pack will help. Swelling and bruising can take several days to resolve..  6. It is common to experience some constipation if taking pain medication after surgery.  Increasing fluid intake and taking a stool softener will usually help or prevent this problem from occurring.  A mild laxative (Milk of Magnesia or Miralax) should be taken according to package directions if there are no bowel movements after 48 hours. 7.  You may have steri-strips (small skin tapes) in place directly over the incision.  These strips should be left on the skin for 7-10 days.  If your  surgeon used skin glue on the incision, you may shower in 24 hours.  The glue will flake off over the next 2-3 weeks.  Any sutures or staples will be removed at the office during your follow-up visit. You may find that a light gauze bandage over your incision may keep your staples from being rubbed or pulled. You may shower and replace the bandage daily. 8. ACTIVITIES:  You may resume regular (light) daily activities beginning the next day--such as daily self-care, walking, climbing stairs--gradually increasing activities as tolerated.  You may have sexual intercourse when it is comfortable.  Refrain from any heavy lifting or straining until approved by your doctor. a. You may drive when you no longer are taking prescription pain medication, you can comfortably wear a seatbelt, and you can safely maneuver your car and apply brakes  9. You should see your doctor in the office for a follow-up appointment approximately two weeks after your surgery.  Make sure that you call for this appointment within a day or two after you arrive home to insure a convenient appointment time.  WHEN TO CALL YOUR DOCTOR: 1. Fever over 101.0 2. Inability to urinate 3. Nausea and/or vomiting 4. Extreme swelling or bruising 5. Continued bleeding from incision. 6. Increased pain, redness, or drainage from the incision. 7. Difficulty swallowing or breathing 8. Muscle cramping or spasms. 9. Numbness or tingling in hands or feet or around lips.  The clinic staff is available to answer your questions during regular business hours.  Please   don't hesitate to call and ask to speak to one of the nurses if you have concerns.  For further questions, please visit [email or web address]   MIDLINE WOUND CARE: If you have to do a wet to dry dressing  - midline dressing to be changed twice daily - supplies: sterile saline, gauze, scissors, ABD pads, tape  - remove dressing and all packing carefully, moistening with sterile  saline as needed to avoid packing/internal dressing sticking to the wound. - clean edges of skin around the wound with water/gauze, making sure there is no tape debris or leakage left on skin that could cause skin irritation or breakdown. - dampen gauze with sterile saline and pack wound from wound base to skin level, making sure to take note of any possible areas of wound tracking, tunneling and packing appropriately. Wound can be packed loosely. Trim gauze to size if a whole kerlix is not required. - cover wound with a dry ABD pad and secure with tape.  - write the date/time on the dry dressing/tape to better track when the last dressing change occurred. - apply any skin protectant/powder recommended by clinician to protect skin/skin folds. - change dressing as needed if leakage occurs, wound gets contaminated, or patient requests to shower. - patient may shower daily with wound open and following the shower the wound should be dried and a clean dressing placed.

## 2020-01-22 NOTE — Progress Notes (Signed)
Wound Vac supplies left on the unit for this pt to take home and be used by Home Health agency but the [facility] was d/ced prior to pts discharge.

## 2020-01-22 NOTE — Progress Notes (Signed)
PROGRESS NOTE    Margaret Kelly  [id number] DOB: 1951-08-04 DOA: 01/13/2020 PCP: Dorena Dew, FNP   Brief Narrative:  History of Present Illness: The patient is a 69 year old Caucasian female with a past medical history significant for but not limited to Anxiety, HTN, PAD, HLD, nerve damage left side secondary to fall, LEFT hip pain, Hx hepatitis C. tobacco abuse.  She is a patient of Dr. Lucio Edward with lumbar GI who presented to the emergency room with severe generalized abdominal pain and inability to move her bowels or expel gas for the last 2 days.  She had been suffering from constipation on a regular basis and had a colonoscopy with Dr. Fuller Plan in March of this year.  And over the last few days she has increasing abdominal pain, bloating and had several calls with the GI clinic.  Recommendations were given and she is advised to come to the emergency department and on May 10 she presented when she was in severe pain.  CT scan showed a volvulus at 3 in the morning.  She underwent urgent sigmoidoscopy as her volvulus was not fully decompressed and general surgery was consulted.  Because she was only partially decompressed and continues have significant abdominal pain she underwent emergent operation by surgery.  After her surgery, internal medicine was consulted for assistance with management of her medical comorbidities   5/14 afebrile overnight, A/O x4, negative S OB.  Positive abdominal pain especially when she coughs.  Nausea controlled on current regimen.  Abdominal pain not controlled on current regimen  5/15 afebrile overnight A/O x4, negative S OB.  States overnight increased abdominal pain so started on PCA pump.  Negative nausea.  ADDENDUM; NG tube began to suction frank blood, exam him wound VAC was occluded and blood pooling.  5/16 A/O x4 patient states abdominal pain has decreased, surgery came by last night as well patient restarted suction on NG tube today no further  bright red blood per suction tube.  Patient states nausea improved and much more comfortable.  5/17 A/O x4, positive abdominal pain but significantly decreased since she had BM today.,  Negative SOB, negative CP.  5/18 A/O x4, minimal abdominal pain.  Ambulated up and down hallway x1 without any difficulty with breathing, abdominal pain, CP.  States negative postprandial nausea/vomiting  01/20/2020 she is improving and not been as much pain.  States that she received pain medication and is comfortable currently.  General surgery is planning on discharging the patient tomorrow She has advanced to soft  01/21/20: He is still doing well and pain is under control.  Still waiting for home health to be set up and has not yet tried a soft diet yet.  Medically she is doing well and blood pressure is better controlled.  01/22/20: She is doing well and wanting to go Home. Surgery arranging D/C Home. Pain is fairly well controlled and she slept well last night.   Assessment & Plan:   Principal Problem:   Cecal volvulus s/p ileocolectomy 01/14/2020 Active Problems:   Essential hypertension   Hyperlipidemia LDL goal <100   History of hepatitis C   Pain of left hip joint   Peripheral arterial disease ([place])   Long term current use of anticoagulant therapy   Nerve damage   GERD (gastroesophageal reflux disease)   Blood transfusion without reported diagnosis   Anxiety   Bowel obstruction (HCC)   Sigmoid volvulus ([place])  Essential HTN -On 5/15 started Metoprolol IV 5 mg QID  hold for MAP<65 and will stop scheduled -On 5/16 Furosemide 60 mg x 1 -On 5/16 Hydralazine 7 mg PRN and will increase to 10 mg q6prn  -5/18 BP starting to trend up we will add back some of her medication.   -5/18 Coreg 12.5 mg BID (1/2 of home regimen) and today will go to full dose of 25 mg po BID -Resumed Losartan 50 mg po Daily and will go to full dose now an is now back on 1 -BP this AM was 171/89 -Continue to Monitor BP  per Protocol  -Follow up with PCP in the outpatient setting   PAD -C/w Clopidogrel 75 mg daily  HLD -C/w Ezetimbie 10 mg daily -Rosuvastatin 10 mg daily   Post-Operative Abdominal Pain  -PCA pump per surgery; Now D/C'd and also has hydromorphone 0.5 mg IV every 3 hours as needed for breakthrough -C/w acetaminophen-650 mg p.o. every 6h scheduled as well as oxycodone IR 5-10 mg p.o. every 4 hours as needed for moderate pain -Pain control per Surgery   GERD -Continue with Pantoprazole 40 mg IV nightly  Anxiety -C/w Lorazepam 1-2 mg q6hprn Anxiety   Tobacco Abuse -Smoking Cessation given  -C/w Nicotine 21 mg TD Patch   Sigmoid volvulus/Bowel obstruction -5/13 s/p ex lap see above -5/15 RN instructed to discontinue NG suction secondary to frank blood being suctioned, increasing abdominal pain.  Surgery paged to bedside -5/18 minimal discharge from wound VAC. -Management per Surgery -[place] nurse consulted   Refractory Nausea -C/w IV Zofran PRN; Change to po if necessary -Thorazine PRN if Zofran fails to alleviate nausea -Nausea has improved  Hypokalemia -Potassium goal> 4 -K+ this AM was 4.0 -Continue to Monitor and Replete as Necessary -Repeat CMP in the AM   Hypomagnesmia -Magnesium goal> 2 -Mag Level this AM was 1.5 -Replete with IV Mag Sulfate 3 grams  -Continue to Monitor and Replete as Necessary -Repeat Mag Level in the AM   Normocytic Anemia -Patient's Hb/Hct is relatively stable and went from 10.7/32.7 -> 10.5/31.0 -> 9.7/29.0 -Check Anemia Panel in the outpatient setting  -Continue to Monitor for S/Sx of Bleeding; No overt bleeding noted -Repeat CBC within 1 week   Thrombocytosis -Mild and slightly worsening -Platelet Count went from 393 -> 402 -> 425 -Continue to Monitor in the Outpatient Setting   Leukocytosis -Mild as patients WBC went from 9.9 -> 10.3 -> 10.7 -Likely Reactive -Continue to Monitor for S/Sx for Infection -Repeat CBC within  1 week  DVT prophylaxis: Enoxaparin 40 mg sq q24h  Code Status: FULL CODE  Family Communication: No family present at bedside  Disposition Plan: Per Primary; Anticipating D/C home either later today or tomorrow   Consultants:   [place]  General Surgery (Primary)  Gastroenterology   Procedures:  Sigmoidoscopy  Procedure by Dr. Johney Maine on 01/14/20 EXPLORATORY LAPAROTOMY  ILEOCOLECTOMY TAP BLOCK - BILATERAL  Antimicrobials:  Anti-infectives (From admission, onward)   Start     Dose/Rate Route Frequency Ordered Stop   01/14/20 1730  piperacillin-tazobactam (ZOSYN) IVPB 3.375 g     3.375 g 12.5 mL/hr over 240 Minutes Intravenous Every 8 hours 01/14/20 1723 01/19/20 1239   01/14/20 1001  sodium chloride 0.9 % with cefoTEtan (CEFOTAN) ADS Med    Note to Pharmacy: Jefm Miles   : cabinet override      01/14/20 1001 01/14/20 1059   01/14/20 0945  cefoTEtan (CEFOTAN) 2 g in sodium chloride 0.9 % 100 mL IVPB     2 g 200 mL/hr  over 30 Minutes Intravenous On call to O.R. 01/14/20 0941 01/14/20 1040   01/14/20 0015  cefTRIAXone (ROCEPHIN) 2 g in sodium chloride 0.9 % 100 mL IVPB     2 g 200 mL/hr over 30 Minutes Intravenous  Once 01/14/20 0008 01/14/20 0158   01/14/20 0015  metroNIDAZOLE (FLAGYL) IVPB 500 mg     500 mg 100 mL/hr over 60 Minutes Intravenous  Once 01/14/20 0008 01/14/20 0243     Subjective: Seen and examined at bedside and she is feeling well.  Denies any nausea or vomiting.  Continues to have some abdominal pain but states it is tolerable.  States that the pain is worse at night after she coughs or urinates or defecates.  No lightheadedness or dizziness.  Surgery arranging her discharge home.  No other concerns or complaints at this time and ready for discharge and she medically stable at this time.   Objective: Vitals:   01/21/20 1440 01/21/20 2110 01/22/20 0401 01/22/20 0822  BP: 126/72 (!) 144/68 (!) 142/100 (!) 171/89  Pulse: 79 78 85 74  Resp:  16 16   Temp:  98.2 F (36.8 C) 98.1 F (36.7 C) 97.8 F (36.6 C)   TempSrc: Oral     SpO2: 98% 98% 97%   Weight:      Height:        Intake/Output Summary (Last 24 hours) at 01/22/2020 1104 Last data filed at 01/21/2020 1623 Gross per 24 hour  Intake 240 ml  Output --  Net 240 ml   Filed Weights   01/14/20 0736 01/14/20 0957 01/14/20 1006  Weight: 70.8 kg 70.8 kg 70.8 kg   Examination: Physical Exam:  Constitutional: WN/WD currently no acute distress appears calm, Eyes: Lids and conjunctivae normal, sclerae anicteric  ENMT: External Ears, Nose appear normal. Grossly normal hearing.  Neck: Appears normal, supple, no cervical masses, normal ROM, no appreciable thyromegaly; no JVD Respiratory: Slightly diminished to auscultation bilaterally, no wheezing, rales, rhonchi or crackles. Normal respiratory effort and patient is not tachypenic. No accessory muscle use.  Unlabored breathing Cardiovascular: RRR, no murmurs / rubs / gallops. S1 and S2 auscultated. No extremity edema.  Abdomen: Soft, tender to palpate in slightly distended secondary body habitus.  He has a midline wound VAC in place   GU: Deferred. Musculoskeletal: No clubbing / cyanosis of digits/nails. No joint deformity upper and lower extremities.  Skin: No rashes, lesions, ulcers on limited skin evaluation. No induration; Warm and dry.  Neurologic: CN 2-12 grossly intact with no focal deficits. Romberg sign and cerebellar reflexes not assessed.  Psychiatric: Normal judgment and insight. Alert and oriented x 3. Normal mood and appropriate affect.   Data Reviewed: I have personally reviewed following labs and imaging studies  CBC: Recent Labs  Lab 01/18/20 0454 01/19/20 0530 01/20/20 0521 01/21/20 0605 01/22/20 0544  WBC 10.2 11.0* 9.9 10.3 10.7*  NEUTROABS 6.6 6.8 5.8 5.5 6.4  HGB 11.0* 10.9* 10.7* 10.5* 9.7*  HCT 31.8* 32.4* 32.7* 31.0* 29.0*  MCV 94.4 96.1 96.7 96.6 97.0  PLT 321 362 393 402* [id number]*   Basic Metabolic  Panel: Recent Labs  Lab 01/18/20 0454 01/18/20 0454 01/18/20 1513 01/19/20 0530 01/20/20 0521 01/21/20 0605 01/22/20 0544  NA 138  --   --  132* 136 135 136  K 2.9*   < > 3.6 4.5 3.9 3.3* 4.0  CL 94*  --   --  95* 101 104 107  CO2 33*  --   --  26  27 24 24   GLUCOSE 110*  --   --  98 123* 120* 173*  BUN 11  --   --  12 6* <5* 8  CREATININE 0.49  --   --  0.48 0.38* 0.47 0.57  CALCIUM 8.3*  --   --  8.1* 8.0* 7.8* 7.9*  MG 1.9   < > 1.9 1.8 1.6* 1.7 1.5*  PHOS 3.5  --   --  3.5 3.4 3.8 4.0   < > = values in this interval not displayed.   GFR: Estimated Creatinine Clearance: 64.8 mL/min (by C-G formula based on SCr of 0.57 mg/dL). Liver Function Tests: Recent Labs  Lab 01/18/20 0454 01/19/20 0530 01/20/20 0521 01/21/20 0605 01/22/20 0544  AST 62* 38 34 24 27  ALT 45* 40 39 33 34  ALKPHOS 83 88 75 65 58  BILITOT 1.5* 1.4* 1.1 0.5 0.4  PROT 5.6* 5.5* 5.1* 5.2* 5.3*  ALBUMIN 2.5* 2.4* 2.2* 2.3* 2.4*   No results for input(s): LIPASE, AMYLASE in the last 168 hours. No results for input(s): AMMONIA in the last 168 hours. Coagulation Profile: No results for input(s): INR, PROTIME in the last 168 hours. Cardiac Enzymes: No results for input(s): CKTOTAL, CKMB, CKMBINDEX, TROPONINI in the last 168 hours. BNP (last 3 results) No results for input(s): PROBNP in the last 8760 hours. HbA1C: No results for input(s): HGBA1C in the last 72 hours. CBG: Recent Labs  Lab 01/19/20 2011 01/19/20 2356 01/20/20 0404 01/20/20 0748 01/20/20 1659  GLUCAP 184* 114* 103* 122* 206*   Lipid Profile: No results for input(s): CHOL, HDL, LDLCALC, TRIG, CHOLHDL, LDLDIRECT in the last 72 hours. Thyroid Function Tests: No results for input(s): TSH, T4TOTAL, FREET4, T3FREE, THYROIDAB in the last 72 hours. Anemia Panel: No results for input(s): VITAMINB12, FOLATE, FERRITIN, TIBC, IRON, RETICCTPCT in the last 72 hours. Sepsis Labs: No results for input(s): PROCALCITON, LATICACIDVEN in the  last 168 hours.  Recent Results (from the past 240 hour(s))  Blood Culture (routine x 2)     Status: None   Collection Time: 01/14/20 12:08 AM   Specimen: BLOOD RIGHT HAND  Result Value Ref Range Status   Specimen Description   Final    BLOOD RIGHT HAND Performed at [facility] [address], [place], [place] [address]    Special Requests   Final    BOTTLES DRAWN AEROBIC AND ANAEROBIC Blood Culture adequate volume Performed at [facility] [address] [place], [place] [address]    Culture   Final    NO GROWTH 5 DAYS Performed at [facility], [place] [address], [place], [place] [address]    Report Status 01/19/2020 FINAL  Final  Blood Culture (routine x 2)     Status: None   Collection Time: 01/14/20 12:57 AM   Specimen: BLOOD  Result Value Ref Range Status   Specimen Description   Final    BLOOD RIGHT ANTECUBITAL Performed at [facility] [address] [place], [place] [address]    Special Requests   Final    BOTTLES DRAWN AEROBIC AND ANAEROBIC Blood Culture adequate volume Performed at [facility] [address], [place], [place] [address]    Culture   Final    NO GROWTH 5 DAYS Performed at [facility], [place] [address] [place],  [address]    Report Status 01/19/2020 FINAL  Final  SARS Coronavirus 2 by RT PCR (hospital order, performed  in [facility] lab) Nasopharyngeal Nasopharyngeal Swab     Status: None   Collection Time: 01/14/20  3:23 AM   Specimen: Nasopharyngeal Swab  Result Value Ref Range Status   SARS Coronavirus 2 NEGATIVE NEGATIVE Final    Comment: (NOTE) SARS-CoV-2 target nucleic acids are NOT DETECTED. The SARS-CoV-2 RNA is generally detectable in upper and lower respiratory specimens during the acute phase of infection. The lowest concentration of SARS-CoV-2 viral copies this assay can detect is 250 copies / mL. A negative result does not  preclude SARS-CoV-2 infection and should not be used as the sole basis for treatment or other patient management decisions.  A negative result may occur with improper specimen collection / handling, submission of specimen other than nasopharyngeal swab, presence of viral mutation(s) within the areas targeted by this assay, and inadequate number of viral copies (<250 copies / mL). A negative result must be combined with clinical observations, patient history, and epidemiological information. Fact Sheet for Patients:   StrictlyIdeas.no Fact Sheet for Healthcare Providers: BankingDealers.co.za This test is not yet approved or cleared  by the [place] FDA and has been authorized for detection and/or diagnosis of SARS-CoV-2 by FDA under an Emergency Use Authorization (EUA).  This EUA will remain in effect (meaning this test can be used) for the duration of the COVID-19 declaration under Section 564(b)(1) of the Act, 21 U.S.C. section 360bbb-3(b)(1), unless the authorization is terminated or revoked sooner. Performed at [facility], [place] [address] [place], [place] [address]     RN Pressure Injury Documentation:     Estimated body mass index is 25.19 kg/m as calculated from the following:   Height as of this encounter: 5\' 6"  (1.676 m).   Weight as of this encounter: 70.8 kg.  Malnutrition Type:      Malnutrition Characteristics:      Nutrition Interventions:      Radiology Studies: No results found.  Scheduled Meds: . acetaminophen  650 mg Oral Q6H  . carvedilol  25 mg Oral BID WC  . chlorhexidine  15 mL Mouth Rinse BID  . clopidogrel  75 mg Oral Q breakfast  . dextromethorphan-guaiFENesin  1 tablet Oral BID  . enoxaparin (LOVENOX) injection  40 mg Subcutaneous Q24H  . ezetimibe  10 mg Oral Daily  . losartan  100 mg Oral Daily  . mouth rinse  15 mL Mouth Rinse BID  . mouth rinse  15 mL Mouth  Rinse q12n4p  . methocarbamol  500 mg Oral TID  . pantoprazole (PROTONIX) IV  40 mg Intravenous QHS  . rosuvastatin  10 mg Oral QHS  . sodium chloride flush  3 mL Intravenous Q12H   Continuous Infusions: . sodium chloride    . chlorproMAZINE (THORAZINE) IV    . magnesium sulfate bolus IVPB 3 g (01/22/20 1006)    LOS: 8 days   Kerney Elbe, DO [facility] PAGER is on [place]  If 7PM-7AM, please contact night-coverage [email or web address]

## 2020-01-22 NOTE — TOC Progression Note (Signed)
Transition of Care [facility]) - Progression Note    Patient Details  Name: Margaret Kelly MRN: [id number] Date of Birth: 08-14-1951  Transition of Care [facility]) CM/SW Contact  Servando Snare, [place] Phone Number: 01/22/2020, 11:42 AM  Clinical Narrative:    Patient still needs hospital bed and [facility]  [facility]- decline [facility]- decline [facility]- decline          Expected Discharge Plan and Services           Expected Discharge Date: 01/22/20                                     Social Determinants of Health (SDOH) Interventions    Readmission Risk Interventions No flowsheet data found.

## 2020-01-22 NOTE — Consult Note (Signed)
[facility] Nurse wound follow up Wound type: Midline surgical Measurement: 14.5cm x 5.4cm x 3cm Wound bed: red, moist Drainage (amount, consistency, odor) serosanguinous in a small amount Periwound:intact Dressing procedure/placement/frequency: One (1) piece of black foam removed from wound bed, wound gently cleansed and patted dry. One (1) piece of black foam used to obliterate dead space, covered with drape. Dressing attached to 119mmHg continuous negative pressure via T.R.A.C.C. pad and an immediate seal is achieved. Patient tolerated well, but was premedicated for pain.  Next dressing change due on Monday, 5/24.  [place] nursing team will not follow, but will remain available to this patient, the nursing and medical teams.  Please re-consult if needed. Thanks, Maudie Flakes, MSN, RN, [place], Arther Abbott  Pager# [phone]

## 2020-01-22 NOTE — Progress Notes (Signed)
If patient is unable to get home health for [facility] changes at home, please discontinue VAC prior to discharge and replaced with saline wet to dry dressing. Family should be instructed on wet to dry dressing prior to discharge. Patient will likely still need home health nursing to get supplies ordered for dressing changes.  Norm Parcel , [facility] Surgery 01/22/2020, 3:18 PM Please see Amion for pager number during day hours 7:00am-4:30pm

## 2020-01-28 ENCOUNTER — Telehealth: Payer: Self-pay | Admitting: Hematology

## 2020-01-28 NOTE — Telephone Encounter (Signed)
Patient talked with insurance company about "visiting nurses".  Was instructed to contact PCP about home care issues and increasing medication dosage of flexeril / Referral for home health if needed, and increased dosage of medication / Patient would like a call back to see if this can be handled over the phone or if she needs a visit.

## 2020-01-29 ENCOUNTER — Other Ambulatory Visit: Payer: Self-pay | Admitting: *Deleted

## 2020-01-29 DIAGNOSIS — M62838 Other muscle spasm: Secondary | ICD-10-CM

## 2020-01-29 DIAGNOSIS — I10 Essential (primary) hypertension: Secondary | ICD-10-CM

## 2020-01-29 MED ORDER — CLOPIDOGREL BISULFATE 75 MG PO TABS: 75.0000 mg | ORAL_TABLET | Freq: Every day | ORAL | 11 refills | Status: DC

## 2020-01-29 MED ORDER — PANTOPRAZOLE SODIUM 40 MG PO TBEC: 40.0000 mg | DELAYED_RELEASE_TABLET | Freq: Every day | ORAL | 3 refills | Status: DC

## 2020-01-29 MED ORDER — LOSARTAN POTASSIUM 100 MG PO TABS: 100.0000 mg | ORAL_TABLET | Freq: Every day | ORAL | 3 refills | Status: DC

## 2020-02-03 ENCOUNTER — Telehealth: Payer: Self-pay | Admitting: Family Medicine

## 2020-02-03 NOTE — Telephone Encounter (Signed)
Jeanette from [facility] pharmacy called wanting to know the collaberating/supervising physician for the prescription received. Reference number is [id number]

## 2020-02-04 NOTE — Telephone Encounter (Signed)
forwarding

## 2020-02-09 ENCOUNTER — Telehealth: Payer: Self-pay

## 2020-02-09 ENCOUNTER — Telehealth: Payer: Self-pay | Admitting: Family Medicine

## 2020-02-09 ENCOUNTER — Other Ambulatory Visit: Payer: Self-pay | Admitting: Family Medicine

## 2020-02-09 DIAGNOSIS — M62838 Other muscle spasm: Secondary | ICD-10-CM

## 2020-02-09 DIAGNOSIS — I10 Essential (primary) hypertension: Secondary | ICD-10-CM

## 2020-02-09 MED ORDER — ROSUVASTATIN CALCIUM 10 MG PO TABS: 10.0000 mg | ORAL_TABLET | Freq: Every day | ORAL | 1 refills | Status: DC

## 2020-02-09 MED ORDER — CLOPIDOGREL BISULFATE 75 MG PO TABS: 75.0000 mg | ORAL_TABLET | Freq: Every day | ORAL | 11 refills | Status: DC

## 2020-02-09 MED ORDER — EZETIMIBE 10 MG PO TABS: 10.0000 mg | ORAL_TABLET | Freq: Every day | ORAL | 1 refills | Status: DC

## 2020-02-09 MED ORDER — CARVEDILOL 25 MG PO TABS: ORAL_TABLET | ORAL | 3 refills | Status: DC

## 2020-02-09 MED ORDER — LOSARTAN POTASSIUM 100 MG PO TABS: 100.0000 mg | ORAL_TABLET | Freq: Every day | ORAL | 3 refills | Status: DC

## 2020-02-09 MED ORDER — PANTOPRAZOLE SODIUM 40 MG PO TBEC: 40.0000 mg | DELAYED_RELEASE_TABLET | Freq: Every day | ORAL | 3 refills | Status: DC

## 2020-02-09 MED ORDER — CYCLOBENZAPRINE HCL 10 MG PO TABS: 10.0000 mg | ORAL_TABLET | Freq: Three times a day (TID) | ORAL | 0 refills | Status: DC | PRN

## 2020-02-09 MED ORDER — HYDROCORTISONE ACETATE 25 MG RE SUPP: 25.0000 mg | Freq: Two times a day (BID) | RECTAL | 0 refills | Status: DC

## 2020-02-09 MED ORDER — CILOSTAZOL 50 MG PO TABS: ORAL_TABLET | ORAL | 2 refills | Status: DC

## 2020-02-09 NOTE — Progress Notes (Unsigned)
Meds ordered this encounter  Medications  . cyclobenzaprine (FLEXERIL) 10 MG tablet    Sig: Take 1 tablet (10 mg total) by mouth 3 (three) times daily as needed.    Dispense:  90 tablet    Refill:  0    Order Specific Question:   Supervising Provider    Answer:   Tresa Garter [id number]  . ezetimibe (ZETIA) 10 MG tablet    Sig: Take 1 tablet (10 mg total) by mouth daily.    Dispense:  90 tablet    Refill:  1    Order Specific Question:   Supervising Provider    Answer:   Tresa Garter [id number]  . cilostazol (PLETAL) 50 MG tablet    Sig: TAKE 1 TABLET(50 MG) BY MOUTH TWICE DAILY    Dispense:  180 tablet    Refill:  2    Order Specific Question:   Supervising Provider    Answer:   Tresa Garter [id number]  . carvedilol (COREG) 25 MG tablet    Sig: TAKE 1 TABLET(25 MG) BY MOUTH TWICE DAILY    Dispense:  180 tablet    Refill:  3    **Patient requests 90 days supply**    Order Specific Question:   Supervising Provider    Answer:   Tresa Garter [[id number]]  . rosuvastatin (CRESTOR) 10 MG tablet    Sig: Take 1 tablet (10 mg total) by mouth at bedtime.    Dispense:  90 tablet    Refill:  1    Order Specific Question:   Supervising Provider    Answer:   Tresa Garter [id number]  . losartan (COZAAR) 100 MG tablet    Sig: Take 1 tablet (100 mg total) by mouth daily.    Dispense:  90 tablet    Refill:  3    Order Specific Question:   Supervising Provider    Answer:   Tresa Garter [id number]  . pantoprazole (PROTONIX) 40 MG tablet    Sig: Take 1 tablet (40 mg total) by mouth daily.    Dispense:  30 tablet    Refill:  3    Order Specific Question:   Supervising Provider    Answer:   Tresa Garter [id number]  . hydrocortisone (ANUSOL-HC) 25 MG suppository    Sig: Place 1 suppository (25 mg total) rectally 2 (two) times daily.    Dispense:  12 suppository    Refill:  0    Order Specific Question:   Supervising Provider    Answer:    Tresa Garter [id number]  . clopidogrel (PLAVIX) 75 MG tablet    Sig: Take 1 tablet (75 mg total) by mouth daily with breakfast.    Dispense:  30 tablet    Refill:  11    Order Specific Question:   Supervising Provider    Answer:   Tresa Garter [[id number]]    All medications have been sent electronically to [facility].    [name]  APRN, MSN, FNP-C Patient [facility] [address] [place], [place] [address] [phone]

## 2020-02-09 NOTE — Telephone Encounter (Signed)
Pt wants a call back to discuss a change in prescription  And paperwork that needs to be completed.

## 2020-02-09 NOTE — Telephone Encounter (Signed)
Signed         Show:Clear all [x] Manual[] Template[] Copied  Added by: [x] Pearson, Tiffany L, RN  [] Hover for details Patient talked with insurance company about "visiting nurses".  Was instructed to contact PCP about home care issues and increasing medication dosage of flexeril / Referral for home health if needed, and increased dosage of medication / Patient would like a call back to see if this can be handled over the phone or if she needs a visit.        Electronically signed by Kendrick Ranch, RN at 01/28/2020 11:15 AM

## 2020-02-10 ENCOUNTER — Telehealth: Payer: Self-pay

## 2020-02-10 NOTE — Telephone Encounter (Signed)
Error

## 2020-02-10 NOTE — Progress Notes (Signed)
Notified patient of this.

## 2020-02-10 NOTE — Telephone Encounter (Signed)
Called and spoke with patient she said that everything is arranged for her now, that  Sister in law and friend is helping her with changing the dressing, that the  nurse from the hospital showed her how to change and clean the wound from her surgery . She was able to p/u medication from the pharmacy . I also let her know that we did receive the form from the mail order pharmacy and has been completed.I told patient that we can change her up coming appt. To phone since she just had surgery. Patient agree, change appt to tele visit.

## 2020-02-10 NOTE — Telephone Encounter (Signed)
-----   Message from Dorena Dew, [place] sent at 02/10/2020  8:42 AM EDT ----- Please inquire about patient's home care needs and possible home care assistance. If we need to set up a telephone visit, I will be happy to do that on tomorrow.    [name]  APRN, MSN, FNP-C Patient [facility] [address] [place], [place] [address] [phone]

## 2020-02-11 ENCOUNTER — Telehealth: Payer: Self-pay | Admitting: Family Medicine

## 2020-02-11 NOTE — Telephone Encounter (Signed)
Pharmacy called needing to know the collaborating physician on a recent prescription sent. You can call them back on [phone]

## 2020-02-12 NOTE — Telephone Encounter (Signed)
Sent fax to mail order pharmacy with dr.j information

## 2020-02-16 ENCOUNTER — Other Ambulatory Visit: Payer: Self-pay

## 2020-02-16 ENCOUNTER — Encounter: Payer: Self-pay | Admitting: Family Medicine

## 2020-02-16 ENCOUNTER — Ambulatory Visit (INDEPENDENT_AMBULATORY_CARE_PROVIDER_SITE_OTHER): Payer: Medicare Other | Admitting: Family Medicine

## 2020-02-16 VITALS — BP 124/74 | HR 80 | Ht 66.0 in | Wt 154.2 lb

## 2020-02-16 DIAGNOSIS — I10 Essential (primary) hypertension: Secondary | ICD-10-CM | POA: Diagnosis not present

## 2020-02-16 DIAGNOSIS — E785 Hyperlipidemia, unspecified: Secondary | ICD-10-CM | POA: Diagnosis not present

## 2020-02-16 DIAGNOSIS — Z09 Encounter for follow-up examination after completed treatment for conditions other than malignant neoplasm: Secondary | ICD-10-CM

## 2020-02-17 ENCOUNTER — Telehealth: Payer: Self-pay | Admitting: Family Medicine

## 2020-02-17 NOTE — Telephone Encounter (Signed)
Refaxed to mail order pharmacy

## 2020-02-18 ENCOUNTER — Ambulatory Visit (INDEPENDENT_AMBULATORY_CARE_PROVIDER_SITE_OTHER): Payer: Medicare Other | Admitting: Gastroenterology

## 2020-02-18 ENCOUNTER — Encounter: Payer: Self-pay | Admitting: Gastroenterology

## 2020-02-18 ENCOUNTER — Inpatient Hospital Stay (HOSPITAL_COMMUNITY): Admission: RE | Admit: 2020-02-18 | Payer: Medicare Other | Source: Ambulatory Visit

## 2020-02-18 VITALS — BP 112/70 | HR 84 | Ht 66.0 in | Wt 145.0 lb

## 2020-02-18 DIAGNOSIS — K562 Volvulus: Secondary | ICD-10-CM

## 2020-02-18 NOTE — Patient Instructions (Signed)
Follow up as needed.   Thank you for choosing me and [facility].  Malcolm T. Stark, Jr., MD., FACG  

## 2020-02-18 NOTE — Progress Notes (Signed)
    History of Present Illness: This is a 69 year old female returning in follow-up status post emergent ileocecectomy for cecal volvulus with ischemia on Jan 14, 2020.  She has had follow-up office visits with Dr. Johney Maine and is progressing well after surgery.  She has some discomfort in her lower abdomen associated with her incision that appears to be normal postoperative progression.  Recent colonoscopy as below.  Colonoscopy 11/2019 - Hemorrhoids and rectal prolapse found on perianal exam. - One 8 mm polyp in the sigmoid colon, removed with a cold snare. Resected and retrieved. - Moderate diverticulosis in the left colon. - External and internal hemorrhoids. - The examination was otherwise normal on direct and retroflexion views.  Current Medications, Allergies, Past Medical History, Past Surgical History, Family History and Social History were reviewed in [employer] record.   Physical Exam: General: Well developed, well nourished, no acute distress Head: Normocephalic and atraumatic Eyes:  sclerae anicteric, EOMI Ears: Normal auditory acuity Mouth: Not examined, mask on during Covid-19 pandemic Lungs: Clear throughout to auscultation Heart: Regular rate and rhythm; no murmurs, rubs or bruits Abdomen: Soft, non tender and non distended. No masses, hepatosplenomegaly or hernias noted. Normal Bowel sounds Rectal: Not done Musculoskeletal: Symmetrical with no gross deformities  Pulses:  Normal pulses noted Extremities: No clubbing, cyanosis, edema or deformities noted Neurological: Alert oriented x 4, grossly nonfocal Psychological:  Alert and cooperative. Normal mood and affect   Assessment and Recommendations:  1.  Status post emergent ileocecectomy for cecal volvulus with ischemia on May 13.  Recovering well after surgery.  Colonoscopy recently performed as above.  Ongoing follow-up with Dr. Johney Maine.  GI follow-up as needed.  2.  Diverticulosis.  History of  diverticulitis.  Long-term high-fiber diet with adequate daily fluid intake.  Continue Benefiber daily.  Add MiraLAX daily for constipation or straining.  3. CRC screening, average risk.  A 10-year interval screening colonoscopy is recommended in March 2031.

## 2020-02-24 NOTE — Progress Notes (Signed)
Cardiology Office Note    Date:  03/01/2020   ID:  Margaret Kelly, DOB 10-30-1950, MRN [id number]  PCP:  Dorena Dew, FNP  Cardiologist: Fransico Him, MD EPS: None  Chief Complaint  Patient presents with  . Follow-up    History of Present Illness:  Margaret Kelly is a 69 y.o. female  with hx of HTN, HLD, tobacco abuse and PAD 08/03/19 S/P successful orbital atherectomy, chocolate balloon angioplasty followed by [facility] of the distal left SFA and highly calcified left popliteal artery using a NAV 6 distal protection with excellent angiographic result.   Patient is status post emergent ileocecectomy for cecal volvulus with ischemia on Jan 14, 2020.  Patient is still recovering from surgery. Patient smoking less than 1/2 pk/day, using chantix. Denies chest pain, dyspnea, dizziness, or presyncope. Had dizziness on HCTZ but much better since she stopped 2 days ago. Scheduled for LE dopplers soon. BP goes up if she has pain but overall controlled.     Past Medical History:  Diagnosis Date  . Anxiety   . Blood transfusion without reported diagnosis 1982  . GERD (gastroesophageal reflux disease)   . History of hepatitis C 10/13/2015  . Hyperlipidemia LDL goal <100 10/13/2015  . Hypertension   . Muscle spasm of left lower extremity 10/13/2015  . Nerve damage 2001   left side as result of fall  . PAD (peripheral artery disease) ([place])   . Pain of left hip joint 02/21/2018  . Plantar wart     Past Surgical History:  Procedure Laterality Date  . ABDOMINAL AORTOGRAM W/LOWER EXTREMITY Bilateral 03/26/2019   Procedure: ABDOMINAL AORTOGRAM W/LOWER EXTREMITY;  Surgeon: Lorretta Harp, MD;  Location: [place] [facility];  Service: Cardiovascular;  Laterality: Bilateral;  . ABDOMINAL AORTOGRAM W/LOWER EXTREMITY Bilateral 08/03/2019   Procedure: ABDOMINAL AORTOGRAM W/LOWER EXTREMITY;  Surgeon: Lorretta Harp, MD;  Location: [place] [facility];  Service: Cardiovascular;  Laterality:  Bilateral;  . ABDOMINAL HYSTERECTOMY  1982  . CARPAL TUNNEL RELEASE Left 2005  . COLECTOMY    . FLEXIBLE SIGMOIDOSCOPY N/A 01/14/2020   Procedure: FLEXIBLE SIGMOIDOSCOPY;  Surgeon: Doran Stabler, MD;  Location: [facility];  Service: Gastroenterology;  Laterality: N/A;  . foot spur Left 2007   foot  . LAPAROTOMY N/A 01/14/2020   Procedure: OPEN RIGHT COLECTOMY, EXPLORATORY LAPAROTOMY;  Surgeon: Michael Boston, MD;  Location: [facility];  Service: General;  Laterality: N/A;  . PERIPHERAL VASCULAR BALLOON ANGIOPLASTY Left 08/03/2019   Procedure: PERIPHERAL VASCULAR BALLOON ANGIOPLASTY;  Surgeon: Lorretta Harp, MD;  Location: [place] [facility];  Service: Cardiovascular;  Laterality: Left;  SFA/ popliteal    Current Medications: Current Meds  Medication Sig  . acetaminophen (TYLENOL) 500 MG tablet Take 1,000 mg by mouth every 6 (six) hours as needed for mild pain.  Marland Kitchen aspirin EC 81 MG tablet Take 81 mg by mouth daily.  . carvedilol (COREG) 25 MG tablet TAKE 1 TABLET(25 MG) BY MOUTH TWICE DAILY  . cilostazol (PLETAL) 50 MG tablet TAKE 1 TABLET(50 MG) BY MOUTH TWICE DAILY  . clopidogrel (PLAVIX) 75 MG tablet Take 1 tablet (75 mg total) by mouth daily with breakfast.  . cyclobenzaprine (FLEXERIL) 10 MG tablet Take 1 tablet (10 mg total) by mouth 3 (three) times daily as needed.  . ezetimibe (ZETIA) 10 MG tablet Take 1 tablet (10 mg total) by mouth daily.  . Fish Oil-Cholecalciferol (FISH OIL + D3) 1200-1000 MG-UNIT CAPS Take 1 capsule by  mouth daily.  Marland Kitchen losartan (COZAAR) 100 MG tablet Take 1 tablet (100 mg total) by mouth daily.  . Melatonin 12 MG TABS Take 12 mg by mouth at bedtime.  . Multiple Vitamin (MULTIVITAMIN WITH MINERALS) TABS tablet Take 1 tablet by mouth daily. Women's One-A-Day   . oxyCODONE (OXY IR/ROXICODONE) 5 MG immediate release tablet Take 1-2 tablets (5-10 mg total) by mouth every 6 (six) hours as needed for moderate pain or severe pain.  . pantoprazole (PROTONIX) 40 MG  tablet Take 1 tablet (40 mg total) by mouth daily.  . rosuvastatin (CRESTOR) 10 MG tablet Take 1 tablet (10 mg total) by mouth at bedtime.     Allergies:   Amlodipine   Social History   Socioeconomic History  . Marital status: Divorced    Spouse name: Not on file  . Number of children: Not on file  . Years of education: Not on file  . Highest education level: Not on file  Occupational History  . Not on file  Tobacco Use  . Smoking status: Current Every Day Smoker    Packs/day: 0.50    Types: Cigarettes  . Smokeless tobacco: Former Systems developer  . Tobacco comment: about half a pack per day  Vaping Use  . Vaping Use: Never used  Substance and Sexual Activity  . Alcohol use: Yes    Comment: rare  . Drug use: No  . Sexual activity: Not on file  Other Topics Concern  . Not on file  Social History Narrative  . Not on file   Social Determinants of Health   Financial Resource Strain:   . Difficulty of Paying Living Expenses:   Food Insecurity:   . Worried About Charity fundraiser in the Last Year:   . Arboriculturist in the Last Year:   Transportation Needs:   . Film/video editor (Medical):   Marland Kitchen Lack of Transportation (Non-Medical):   Physical Activity:   . Days of Exercise per Week:   . Minutes of Exercise per Session:   Stress:   . Feeling of Stress :   Social Connections:   . Frequency of Communication with Friends and Family:   . Frequency of Social Gatherings with Friends and Family:   . Attends Religious Services:   . Active Member of Clubs or Organizations:   . Attends Archivist Meetings:   Marland Kitchen Marital Status:      Family History:  The patient's   family history includes Heart disease in her sister.   ROS:   Please see the history of present illness.    ROS All other systems reviewed and are negative.   PHYSICAL EXAM:   VS:  BP 122/80   Pulse 80   Ht 5' 6"  (1.676 m)   Wt 146 lb (66.2 kg)   SpO2 100%   BMI 23.57 kg/m   Physical Exam  GEN:  Thin, in no acute distress  HEENT: normal  Neck: no JVD, carotid bruits, or masses Cardiac:RRR; 1/6 systolic murmur,no rubs, or gallops  Respiratory:  clear to auscultation bilaterally, normal work of breathing GI: soft, nontender, nondistended, + BS Ext: without cyanosis, clubbing, or edema, decreased distal pulses bilaterally Neuro:  Alert and Oriented x 3 Psych: euthymic mood, full affect  Wt Readings from Last 3 Encounters:  03/01/20 146 lb (66.2 kg)  02/18/20 145 lb (65.8 kg)  02/16/20 154 lb 3.2 oz (69.9 kg)      Studies/Labs Reviewed:   EKG:  EKG is  ordered today.  The ekg ordered today demonstrates NSR with TWI ant/lat when compared to prior EKG's  Recent Labs: 01/05/2020: TSH 1.860 01/22/2020: ALT 34; BUN 8; Creatinine, Ser 0.57; Hemoglobin 9.7; Magnesium 1.5; Platelets 425; Potassium 4.0; Sodium 136   Lipid Panel    Component Value Date/Time   CHOL 153 06/08/2019 1000   TRIG 87 06/08/2019 1000   HDL 64 06/08/2019 1000   CHOLHDL 2.4 06/08/2019 1000   CHOLHDL 4.5 03/22/2017 1116   VLDL 40 (H) 03/22/2017 1116   LDLCALC 73 06/08/2019 1000    Additional studies/ records that were reviewed today include:  PERIPHERAL VASCULAR BALLOON ANGIOPLASTY  ABDOMINAL AORTOGRAM W/LOWER EXTREMITY    Procedures Performed:               1.  Ultrasound-guided right common femoral access               2.  Contralateral access (secondary catheter placement)               3.  Placement of an NAV 6 distal protection device in the tibioperoneal trunk               4.  Diamondback orbital rotational atherectomy distal left SFA and popliteal artery               5.  DCB distal left SFA and popliteal artery               6.  Right common femoral angiogram, Mynx closure   Final Impression: Successful orbital atherectomy, chocolate balloon angioplasty followed by [facility] of the distal left SFA and highly calcified left popliteal artery using a NAV 6 distal protection with excellent angiographic  result.  The groin was hemostatically sealed with a MYNX closure device.  Patient received 300 mg of p.o. Plavix.  She left lab in stable condition.  She will be gently hydrated overnight, discharged home in the morning on dual antiplatelet therapy.  We will obtain lower extremity arterial Doppler studies in our [facility] line office next week and I will see her back the week after for follow-up.       ASSESSMENT:    1. Essential hypertension   2. PAD (peripheral artery disease) ([place])   3. Hyperlipidemia, unspecified hyperlipidemia type   4. Tobacco abuse   5. Abnormal ECG   6. Atherosclerosis of aorta ([place])   7. Aortic atherosclerosis ([facility])      PLAN:  In order of problems listed above:  Abnormal EKG with new TWI ant/lat. Patient asymptomatic but not active since she just had bowel surgery. Still smoking. Will check an echo and Coronary CTA. Discussed with Dr. Meda Coffee who agrees.  Hypertension BP runs high at times but can't tolerate HCTZ-dizziness, ringing in her ear.also didn't tolerate amlodipine. BP stable today. Continue to monitor at home.  PAD status post successful orbital atherectomy talk with balloon angioplasty followed by [facility] of the distal left SFA and highly calcified left popliteal artery with excellent result 08/03/2019- some trouble with right leg but not left. Decreased pulses bilaterally but feet warm. F/u with Dr. Gwenlyn Found  Hyperlipidemia LDL 73 06/2019  Tobacco abuse-smoking cessation discussed. Nicotine patches.    Medication Adjustments/Labs and Tests Ordered: Current medicines are reviewed at length with the patient today.  Concerns regarding medicines are outlined above.  Medication changes, Labs and Tests ordered today are listed in the Patient Instructions below. Patient Instructions  Medication Instructions:  Your physician recommends that  you continue on your current medications as directed. Please refer to the Current Medication list given to you today.  A  prescription was given for Nicotine Patches. Please use as directed.  *If you need a refill on your cardiac medications before your next appointment, please call your pharmacy*   Lab Work: None  If you have labs (blood work) drawn today and your tests are completely normal, you will receive your results only by: Marland Kitchen MyChart Message (if you have MyChart) OR . A paper copy in the mail If you have any lab test that is abnormal or we need to change your treatment, we will call you to review the results.   Testing/Procedures: Your physician has requested that you have an echocardiogram. Echocardiography is a painless test that uses sound waves to create images of your heart. It provides your doctor with information about the size and shape of your heart and how well your heart's chambers and valves are working. This procedure takes approximately one hour. There are no restrictions for this procedure.  Your physician has requested that you have cardiac CT.  Follow-Up: At [facility], you and your health needs are our priority.  As part of our continuing mission to provide you with exceptional heart care, we have created designated Provider Care Teams.  These Care Teams include your primary Cardiologist (physician) and Advanced Practice Providers (APPs -  Physician Assistants and Nurse Practitioners) who all work together to provide you with the care you need, when you need it.  We recommend signing up for the patient portal called "MyChart".  Sign up information is provided on this After Visit Summary.  MyChart is used to connect with patients for Virtual Visits (Telemedicine).  Patients are able to view lab/test results, encounter notes, upcoming appointments, etc.  Non-urgent messages can be sent to your provider as well.   To learn more about what you can do with MyChart, go to [email or web address].    Your next appointment:   1 month(s)  The format for your next appointment:   In  Person  Provider:   Ermalinda Barrios, PA-C   Other Instructions Your cardiac CT will be scheduled at one of the below locations:   [facility] [address] [place], [place] [address] [phone]  [place] [address] [place], [place] [address] [phone]  If scheduled at [facility], please arrive at the [facility] main entrance of [facility] 30 minutes prior to test start time. Proceed to the [facility] Radiology Department (first floor) to check-in and test prep.  If scheduled at [facility], please arrive 15 mins early for check-in and test prep.  Please follow these instructions carefully (unless otherwise directed):   On the Night Before the Test: . Be sure to Drink plenty of water. . Do not consume any caffeinated/decaffeinated beverages or chocolate 12 hours prior to your test. . Do not take any antihistamines 12 hours prior to your test.   On the Day of the Test: . Drink plenty of water. Do not drink any water within one hour of the test. . Do not eat any food 4 hours prior to the test. . You may take your regular medications prior to the test.  . Take metoprolol (Lopressor) 25 MG two hours prior to test. . FEMALES- please wear underwire-free bra if available      After the Test: . Drink plenty of  water. . After receiving IV contrast, you may experience a mild flushed feeling. This is normal. . On occasion, you may experience a mild rash up to 24 hours after the test. This is not dangerous. If this occurs, you can take Benadryl 25 mg and increase your fluid intake. . If you experience trouble breathing, this can be serious. If it is severe call 911 IMMEDIATELY. If it is mild, please call our office.   Once we have confirmed authorization from your insurance company, we will call you to set up a date and time for your test. Based on how quickly  your insurance processes prior authorizations requests, please allow up to 4 weeks to be contacted for scheduling your Cardiac CT appointment. Be advised that routine Cardiac CT appointments could be scheduled as many as 8 weeks after your provider has ordered it.  For non-scheduling related questions, please contact the cardiac imaging nurse navigator should you have any questions/concerns: Marchia Bond, Cardiac Imaging Nurse Navigator Burley Saver, Interim Cardiac Imaging Nurse [place] and Vascular Services Direct Office Dial: [phone]   For scheduling needs, including cancellations and rescheduling, please call Vivien Rota at [phone].        Sumner Boast, PA-C  03/01/2020 10:34 AM    [facility] [place], [place], [place]  [address] Phone: [phone]; Fax: [phone]

## 2020-03-01 ENCOUNTER — Other Ambulatory Visit: Payer: Self-pay

## 2020-03-01 ENCOUNTER — Encounter: Payer: Self-pay | Admitting: Physician Assistant

## 2020-03-01 ENCOUNTER — Ambulatory Visit (INDEPENDENT_AMBULATORY_CARE_PROVIDER_SITE_OTHER): Payer: Medicare Other | Admitting: Physician Assistant

## 2020-03-01 VITALS — BP 122/80 | HR 80 | Ht 66.0 in | Wt 146.0 lb

## 2020-03-01 DIAGNOSIS — E785 Hyperlipidemia, unspecified: Secondary | ICD-10-CM | POA: Diagnosis not present

## 2020-03-01 DIAGNOSIS — I10 Essential (primary) hypertension: Secondary | ICD-10-CM

## 2020-03-01 DIAGNOSIS — Z720 Tobacco use: Secondary | ICD-10-CM

## 2020-03-01 DIAGNOSIS — R9431 Abnormal electrocardiogram [ECG] [EKG]: Secondary | ICD-10-CM

## 2020-03-01 DIAGNOSIS — I700 Atherosclerosis of aorta: Secondary | ICD-10-CM

## 2020-03-01 DIAGNOSIS — I739 Peripheral vascular disease, unspecified: Secondary | ICD-10-CM

## 2020-03-01 MED ORDER — NICOTINE 14 MG/24HR TD PT24
14.0000 mg | MEDICATED_PATCH | Freq: Every day | TRANSDERMAL | 0 refills | Status: AC
Start: 2020-03-01 — End: ?

## 2020-03-01 MED ORDER — METOPROLOL TARTRATE 25 MG PO TABS
ORAL_TABLET | ORAL | 0 refills | Status: DC
Start: 2020-03-01 — End: 2020-04-05

## 2020-03-01 MED ORDER — NICOTINE 7 MG/24HR TD PT24
7.0000 mg | MEDICATED_PATCH | Freq: Every day | TRANSDERMAL | 0 refills | Status: AC
Start: 2020-03-01 — End: ?

## 2020-03-01 NOTE — Patient Instructions (Addendum)
Medication Instructions:  Your physician recommends that you continue on your current medications as directed. Please refer to the Current Medication list given to you today.  A prescription was given for Nicotine Patches. Please use as directed.  *If you need a refill on your cardiac medications before your next appointment, please call your pharmacy*   Lab Work: None  If you have labs (blood work) drawn today and your tests are completely normal, you will receive your results only by:  [place] (if you have MyChart) OR  A paper copy in the mail If you have any lab test that is abnormal or we need to change your treatment, we will call you to review the results.   Testing/Procedures: Your physician has requested that you have an echocardiogram. Echocardiography is a painless test that uses sound waves to create images of your heart. It provides your doctor with information about the size and shape of your heart and how well your hearts chambers and valves are working. This procedure takes approximately one hour. There are no restrictions for this procedure.  Your physician has requested that you have cardiac CT.  Follow-Up: At [facility], you and your health needs are our priority.  As part of our continuing mission to provide you with exceptional heart care, we have created designated Provider Care Teams.  These Care Teams include your primary Cardiologist (physician) and Advanced Practice Providers (APPs -  Physician Assistants and Nurse Practitioners) who all work together to provide you with the care you need, when you need it.  We recommend signing up for the patient portal called "MyChart".  Sign up information is provided on this After Visit Summary.  MyChart is used to connect with patients for Virtual Visits (Telemedicine).  Patients are able to view lab/test results, encounter notes, upcoming appointments, etc.  Non-urgent messages can be sent to your provider as  well.   To learn more about what you can do with MyChart, go to [email or web address].    Your next appointment:   1 month(s)  The format for your next appointment:   In Person  Provider:   Ermalinda Barrios, PA-C   Other Instructions Your cardiac CT will be scheduled at one of the below locations:   [facility] [address] [place], [place] [address] [phone]  [place] [address] [place], [place] [address] [phone]  If scheduled at [facility], please arrive at the [facility] main entrance of [facility] 30 minutes prior to test start time. Proceed to the [facility] Radiology Department (first floor) to check-in and test prep.  If scheduled at [facility], please arrive 15 mins early for check-in and test prep.  Please follow these instructions carefully (unless otherwise directed):   On the Night Before the Test:  Be sure to Drink plenty of water.  Do not consume any caffeinated/decaffeinated beverages or chocolate 12 hours prior to your test.  Do not take any antihistamines 12 hours prior to your test.   On the Day of the Test:  Drink plenty of water. Do not drink any water within one hour of the test.  Do not eat any food 4 hours prior to the test.  You may take your regular medications prior to the test.   Take metoprolol (Lopressor) 25 MG two hours prior to test.  FEMALES- please wear underwire-free bra if available      After  the Test:  Drink plenty of water.  After receiving IV contrast, you may experience a mild flushed feeling. This is normal.  On occasion, you may experience a mild rash up to 24 hours after the test. This is not dangerous. If this occurs, you can take Benadryl 25 mg and increase your fluid intake.  If you experience trouble breathing, this can be serious. If it is severe call 911 IMMEDIATELY. If it is  mild, please call our office.   Once we have confirmed authorization from your insurance company, we will call you to set up a date and time for your test. Based on how quickly your insurance processes prior authorizations requests, please allow up to 4 weeks to be contacted for scheduling your Cardiac CT appointment. Be advised that routine Cardiac CT appointments could be scheduled as many as 8 weeks after your provider has ordered it.  For non-scheduling related questions, please contact the cardiac imaging nurse navigator should you have any questions/concerns: Marchia Bond, Cardiac Imaging Nurse Navigator Burley Saver, Interim Cardiac Imaging Nurse [place] and Vascular Services Direct Office Dial: [phone]   For scheduling needs, including cancellations and rescheduling, please call Vivien Rota at [phone].

## 2020-03-02 ENCOUNTER — Telehealth: Payer: Self-pay | Admitting: Family Medicine

## 2020-03-02 NOTE — Telephone Encounter (Signed)
Pt called to inform us that she is no longer taking hydroclorothiazide.

## 2020-03-03 DIAGNOSIS — I2510 Atherosclerotic heart disease of native coronary artery without angina pectoris: Secondary | ICD-10-CM | POA: Insufficient documentation

## 2020-03-03 HISTORY — DX: Atherosclerotic heart disease of native coronary artery without angina pectoris: I25.10

## 2020-03-03 NOTE — Progress Notes (Addendum)
[facility]   Virtual Visit via Telephone Note Patient: Home Provider office: [place]  I connected with Margaret Kelly Im on 03/03/20 at 10:00 AM EDT by telephone at [facility] and verified that I am speaking with the correct person using two identifiers.   I discussed the limitations, risks, security and privacy concerns of performing an evaluation and management service by telephone and the availability of in person appointments. I also discussed with the patient that there may be a patient responsible charge related to this service. The patient expressed understanding and agreed to proceed.   History of Present Illness: Margaret Kelly is a 69 year old female with medical history significant for essential hypertension, tobacco dependence, generalized anxiety, peripheral artery disease, and hyperlipidemia is following up via phone post hospital admission.  Patient denies seen in gastroenterology on 01/09/2020 with complaints of constipation.  CT scan at that time showed no acute abnormalities.  There was nonspecific thickening of the distal rectum/anus which may represent soft tissue mass or internal hemorrhoids.  There was no acute abnormalities and there was an average stool burden throughout the colon.  Patient was treated with suppositories and discharged at that time.  Patient reported she continued to have progressive pain.  She has been taking Tylenol without any relief.  She had some very small watery stools 2 days prior to admission.  After taking Dulcolax suppositories, patient continued to be constipated.  She also endorses some nausea but no vomiting.  Patient was referred back in February to [facility] for ongoing abdominal pain.  She also has a history of a 10 mm hyperplastic polyp that was removed in 2015, internal hemorrhoids, and rectal prolapse.  Patient also has a history of diverticulosis.  She  was found to have mild focal inflammatory changes in the left lower quadrant near the descending/proximal sigmoid colon with mild colon wall thickening thought to be secondary to acute diverticulitis.  Patient was treated for diverticulitis and underwent a colonoscopy on 11/10/2019.  She was worked up again on 01/14/2020 and thought to have sigmoid volvulus.  Patient underwent flexible sigmoidoscopy on 01/14/2020 by Dr. Wilfrid Lund without complication.  Patient has followed up with surgeon and is doing quite well.  She continues to complain of mild pain, but states that it is improving. Patient states that blood pressure has been well controlled over the past several weeks.  She has been checking blood pressure at home.  She is taking all medications consistently.  She denies any headache, chest pain, fever, chills, shortness of breath, urinary symptoms, nausea, vomiting, or diarrhea on today.   Past Medical History:  Diagnosis Date  . Anxiety   . Blood transfusion without reported diagnosis 1982  . GERD (gastroesophageal reflux disease)   . History of hepatitis C 10/13/2015  . Hyperlipidemia LDL goal <100 10/13/2015  . Hypertension   . Muscle spasm of left lower extremity 10/13/2015  . Nerve damage 2001   left side as result of fall  . PAD (peripheral artery disease) ([facility])   . Pain of left hip joint 02/21/2018  . Plantar wart    Allergies  Allergen Reactions  . Amlodipine Other (See Comments)    Ringing in ears   Social History   Socioeconomic History  . Marital status: Divorced    Spouse name: Not on file  . Number of children: Not on file  . Years of education: Not on file  .  Highest education level: Not on file  Occupational History  . Not on file  Tobacco Use  . Smoking status: Current Every Day Smoker    Packs/day: 0.50    Types: Cigarettes  . Smokeless tobacco: Former Systems developer  . Tobacco comment: about half a pack per day  Vaping Use  . Vaping Use: Never used  Substance and Sexual  Activity  . Alcohol use: Yes    Comment: rare  . Drug use: No  . Sexual activity: Not on file  Other Topics Concern  . Not on file  Social History Narrative  . Not on file   Social Determinants of Health   Financial Resource Strain:   . Difficulty of Paying Living Expenses:   Food Insecurity:   . Worried About Charity fundraiser in the Last Year:   . Arboriculturist in the Last Year:   Transportation Needs:   . Film/video editor (Medical):   Marland Kitchen Lack of Transportation (Non-Medical):   Physical Activity:   . Days of Exercise per Week:   . Minutes of Exercise per Session:   Stress:   . Feeling of Stress :   Social Connections:   . Frequency of Communication with Friends and Family:   . Frequency of Social Gatherings with Friends and Family:   . Attends Religious Services:   . Active Member of Clubs or Organizations:   . Attends Archivist Meetings:   Marland Kitchen Marital Status:   Intimate Partner Violence:   . Fear of Current or Ex-Partner:   . Emotionally Abused:   Marland Kitchen Physically Abused:   . Sexually Abused:    Allergies  Allergen Reactions  . Amlodipine Other (See Comments)    Ringing in ears     Assessment and Plan: Essential hypertension Continue medication, monitor blood pressure at home. Continue DASH diet. Reminder to go to the ER if any CP, SOB, nausea, dizziness, severe HA, changes vision/speech, left arm numbness and tingling and jaw pain.  Hyperlipidemia, unspecified hyperlipidemia type The patient is asked to make an attempt to improve diet and exercise patterns to aid in medical management of this problem. The 10-year ASCVD risk score [name] DC Brooke Bonito., et al., 2013) is: 11.1%   Values used to calculate the score:     Age: 63 years     Sex: Female     Is Non-Hispanic African American: No     Diabetic: No     Tobacco smoker: Yes     Systolic Blood Pressure: 676 mmHg     Is BP treated: Yes     HDL Cholesterol: 64 mg/dL     Total Cholesterol: 153  mg/dL   Follow up Post hospital follow-up after undergoing flexible sigmoidoscopy on 01/14/2020.  Patient has had follow-up with surgeon and is recovering well.  She continues to have mild pain that is controlled on OTC analgesics.  Recommend that patient follow-up with gastroenterology.   Follow Up Instructions:    I discussed the assessment and treatment plan with the patient. The patient was provided an opportunity to ask questions and all were answered. The patient agreed with the plan and demonstrated an understanding of the instructions.   The patient was advised to call back or seek an in-person evaluation if the symptoms worsen or if the condition fails to improve as anticipated.  I provided 10 minutes of non-face-to-face time during this encounter.  [name]  APRN, MSN, FNP-C [facility] [address] [place], [place] [address] [phone]

## 2020-03-08 ENCOUNTER — Ambulatory Visit (HOSPITAL_COMMUNITY)
Admission: RE | Admit: 2020-03-08 | Discharge: 2020-03-08 | Disposition: A | Discharge status: Home / Self Care | Payer: Medicare Other | Source: Ambulatory Visit | Attending: Internal Medicine | Admitting: Internal Medicine

## 2020-03-08 ENCOUNTER — Other Ambulatory Visit: Payer: Self-pay

## 2020-03-08 DIAGNOSIS — I739 Peripheral vascular disease, unspecified: Secondary | ICD-10-CM | POA: Insufficient documentation

## 2020-03-16 ENCOUNTER — Other Ambulatory Visit: Payer: Self-pay

## 2020-03-16 DIAGNOSIS — I739 Peripheral vascular disease, unspecified: Secondary | ICD-10-CM

## 2020-03-16 NOTE — Progress Notes (Signed)
vas 

## 2020-03-17 ENCOUNTER — Other Ambulatory Visit: Payer: Self-pay

## 2020-03-17 ENCOUNTER — Telehealth (HOSPITAL_COMMUNITY): Payer: Self-pay | Admitting: *Deleted

## 2020-03-17 DIAGNOSIS — E785 Hyperlipidemia, unspecified: Secondary | ICD-10-CM

## 2020-03-17 DIAGNOSIS — Z720 Tobacco use: Secondary | ICD-10-CM

## 2020-03-17 DIAGNOSIS — I700 Atherosclerosis of aorta: Secondary | ICD-10-CM

## 2020-03-17 DIAGNOSIS — I739 Peripheral vascular disease, unspecified: Secondary | ICD-10-CM

## 2020-03-17 DIAGNOSIS — R9431 Abnormal electrocardiogram [ECG] [EKG]: Secondary | ICD-10-CM | POA: Diagnosis not present

## 2020-03-17 DIAGNOSIS — I10 Essential (primary) hypertension: Secondary | ICD-10-CM | POA: Diagnosis not present

## 2020-03-17 LAB — BASIC METABOLIC PANEL
BUN/Creatinine Ratio: 13 (ref 12–28)
BUN: 8 mg/dL (ref 8–27)
CO2: 23 mmol/L (ref 20–29)
Calcium: 9.3 mg/dL (ref 8.7–10.3)
Chloride: 104 mmol/L (ref 96–106)
Creatinine, Ser: 0.61 mg/dL (ref 0.57–1.00)
GFR calc Af Amer: 109 mL/min/{1.73_m2} (ref >59)
GFR calc non Af Amer: 95 mL/min/{1.73_m2} (ref >59)
Glucose: 118 mg/dL — ABNORMAL HIGH (ref 65–99)
Potassium: 4.2 mmol/L (ref 3.5–5.2)
Sodium: 140 mmol/L (ref 134–144)

## 2020-03-17 NOTE — Telephone Encounter (Signed)

## 2020-03-18 ENCOUNTER — Other Ambulatory Visit: Payer: Self-pay

## 2020-03-18 ENCOUNTER — Ambulatory Visit (HOSPITAL_COMMUNITY)
Admission: RE | Admit: 2020-03-18 | Discharge: 2020-03-18 | Disposition: A | Discharge status: Home / Self Care | Payer: Medicare Other | Source: Ambulatory Visit | Attending: Physician Assistant | Admitting: Physician Assistant

## 2020-03-18 DIAGNOSIS — Z720 Tobacco use: Secondary | ICD-10-CM

## 2020-03-18 DIAGNOSIS — E785 Hyperlipidemia, unspecified: Secondary | ICD-10-CM

## 2020-03-18 DIAGNOSIS — I739 Peripheral vascular disease, unspecified: Secondary | ICD-10-CM

## 2020-03-18 DIAGNOSIS — I2510 Atherosclerotic heart disease of native coronary artery without angina pectoris: Secondary | ICD-10-CM | POA: Diagnosis not present

## 2020-03-18 DIAGNOSIS — I700 Atherosclerosis of aorta: Secondary | ICD-10-CM

## 2020-03-18 DIAGNOSIS — R9431 Abnormal electrocardiogram [ECG] [EKG]: Secondary | ICD-10-CM | POA: Diagnosis not present

## 2020-03-18 DIAGNOSIS — I10 Essential (primary) hypertension: Secondary | ICD-10-CM | POA: Diagnosis not present

## 2020-03-18 DIAGNOSIS — Z6823 Body mass index (BMI) 23.0-23.9, adult: Secondary | ICD-10-CM

## 2020-03-18 MED ORDER — NITROGLYCERIN 0.4 MG SL SUBL
0.8000 mg | SUBLINGUAL_TABLET | Freq: Once | SUBLINGUAL | Status: AC
  Administered 2020-03-18: 0.8 mg via SUBLINGUAL

## 2020-03-18 MED ORDER — IOHEXOL 350 MG/ML SOLN
80.0000 mL | Freq: Once | INTRAVENOUS | Status: AC | PRN
  Administered 2020-03-18: 80 mL via INTRAVENOUS

## 2020-03-18 MED ORDER — METOPROLOL TARTRATE 5 MG/5ML IV SOLN
INTRAVENOUS | Status: AC
  Filled 2020-03-18: qty 10

## 2020-03-18 MED ORDER — METOPROLOL TARTRATE 5 MG/5ML IV SOLN: 5.0000 mg | INTRAVENOUS | Status: DC | PRN

## 2020-03-18 MED ORDER — NITROGLYCERIN 0.4 MG SL SUBL
SUBLINGUAL_TABLET | SUBLINGUAL | Status: AC
  Filled 2020-03-18: qty 2

## 2020-03-24 ENCOUNTER — Other Ambulatory Visit: Payer: Self-pay

## 2020-03-24 ENCOUNTER — Ambulatory Visit (HOSPITAL_COMMUNITY): Payer: Medicare Other | Attending: Cardiology

## 2020-03-24 DIAGNOSIS — R9431 Abnormal electrocardiogram [ECG] [EKG]: Secondary | ICD-10-CM | POA: Insufficient documentation

## 2020-03-24 LAB — ECHOCARDIOGRAM COMPLETE
Area-P 1/2: 3.31 cm2
S' Lateral: 2.4 cm

## 2020-04-05 ENCOUNTER — Other Ambulatory Visit: Payer: Self-pay

## 2020-04-05 ENCOUNTER — Encounter: Payer: Self-pay | Admitting: Family Medicine

## 2020-04-05 ENCOUNTER — Ambulatory Visit (INDEPENDENT_AMBULATORY_CARE_PROVIDER_SITE_OTHER): Payer: Medicare Other | Admitting: Family Medicine

## 2020-04-05 VITALS — BP 136/82 | HR 75 | Temp 98.7°F | Resp 16 | Ht 66.0 in | Wt 150.0 lb

## 2020-04-05 DIAGNOSIS — K562 Volvulus: Secondary | ICD-10-CM

## 2020-04-05 DIAGNOSIS — E785 Hyperlipidemia, unspecified: Secondary | ICD-10-CM

## 2020-04-05 DIAGNOSIS — I10 Essential (primary) hypertension: Secondary | ICD-10-CM | POA: Diagnosis not present

## 2020-04-05 DIAGNOSIS — K219 Gastro-esophageal reflux disease without esophagitis: Secondary | ICD-10-CM | POA: Diagnosis not present

## 2020-04-05 LAB — POCT URINALYSIS DIPSTICK
Bilirubin, UA: NEGATIVE
Glucose, UA: NEGATIVE
Ketones, UA: NEGATIVE
Leukocytes, UA: NEGATIVE
Nitrite, UA: NEGATIVE
Protein, UA: NEGATIVE
Spec Grav, UA: 1.025 (ref 1.010–1.025)
Urobilinogen, UA: 0.2 E.U./dL
pH, UA: 5.5 (ref 5.0–8.0)

## 2020-04-05 NOTE — Patient Instructions (Signed)

## 2020-04-06 ENCOUNTER — Ambulatory Visit: Payer: Medicare Other | Admitting: Physician Assistant

## 2020-04-06 LAB — BASIC METABOLIC PANEL
BUN/Creatinine Ratio: 18 (ref 12–28)
BUN: 13 mg/dL (ref 8–27)
CO2: 22 mmol/L (ref 20–29)
Calcium: 9.6 mg/dL (ref 8.7–10.3)
Chloride: 105 mmol/L (ref 96–106)
Creatinine, Ser: 0.71 mg/dL (ref 0.57–1.00)
GFR calc Af Amer: 103 mL/min/{1.73_m2} (ref >59)
GFR calc non Af Amer: 89 mL/min/{1.73_m2} (ref >59)
Glucose: 128 mg/dL — ABNORMAL HIGH (ref 65–99)
Potassium: 4.3 mmol/L (ref 3.5–5.2)
Sodium: 141 mmol/L (ref 134–144)

## 2020-04-06 LAB — MAGNESIUM: Magnesium: 1.8 mg/dL (ref 1.6–2.3)

## 2020-04-15 NOTE — Progress Notes (Signed)
[facility]    Established Patient Office Visit  Subjective:  Patient ID: Margaret Kelly, female    DOB: [birth year]  Age: 69 y.o. MRN: [id number]  CC:  Chief Complaint  Patient presents with   Hypertension    HPI Margaret Kelly is a very pleasant 69 year old female that presents for a follow up of chronic conditions. She has a medical history significant for essential hypertension, GERD, history of hepatitis C, history of hyperlipidemia, and tobacco dependence.  Patient recently underwent abdominal surgery due to cirrhosis and peripheral disease with volvulus.  Patient was seen on 01/09/2020 with complaints of constipation.  CT at that time showed no acute abnormalities.  There was nonspecific thickening of distal rectum, anus that possibly represent a soft tissue mass or internal hemorrhoids.  At that time, patient had a moderate stool burden throughout the colon was treated with suppositories and discharged patient says that she had continued pain that was progressing.  She was taking Tylenol pretty consistently without any relief.  Patient underwent CT of the abdomen and pelvis in the ER with contrast that showed groundglass opacities in the lingula that could reflect early pneumonia, large dilated loop of bowel with central abdomen measuring 9 cm in diameter and appearance and location was consistent with sigmoid volvulus.  Patient underwent surgery for this problem.  She is followed by [facility] surgery, Dr. Johney Maine.  She states that she is doing well.  Abdominal incision has healed without drainage.  She says that she is not having abdominal pain, constipation, diarrhea, or nausea.  Patient is taking all of her medications consistently and without interruption.  She says that she has been following a diet that consists mostly of vegetables and some fruits.  She says that she is eating very little meat.  She says that she feels well and has  been able to be very active in her yard and has spent time in the pool recently.  Patient is followed by cardiology for hypertension, hyperlipidemia, and PAD.  She has continued on Plavix.  Patient has no signs of bleeding.  Patient continues to have consistent tobacco dependence.  She has tried to quit in the past and was successful at one point.  On today, she denies any headache, shortness of breath, chest pain, bilateral lower extremity swelling, or orthopnea.  Past Medical History:  Diagnosis Date   Anxiety    Blood transfusion without reported diagnosis 1982   GERD (gastroesophageal reflux disease)    History of hepatitis C 10/13/2015   Hyperlipidemia LDL goal <100 10/13/2015   Hypertension    Muscle spasm of left lower extremity 10/13/2015   Nerve damage 2001   left side as result of fall   PAD (peripheral artery disease) ([facility])    Pain of left hip joint 02/21/2018   Plantar wart     Past Surgical History:  Procedure Laterality Date   ABDOMINAL AORTOGRAM W/LOWER EXTREMITY Bilateral 03/26/2019   Procedure: ABDOMINAL AORTOGRAM W/LOWER EXTREMITY;  Surgeon: Lorretta Harp, MD;  Location: [facility];  Service: Cardiovascular;  Laterality: Bilateral;   ABDOMINAL AORTOGRAM W/LOWER EXTREMITY Bilateral 08/03/2019   Procedure: ABDOMINAL AORTOGRAM W/LOWER EXTREMITY;  Surgeon: Lorretta Harp, MD;  Location: [facility];  Service: Cardiovascular;  Laterality: Bilateral;   ABDOMINAL HYSTERECTOMY  1982   CARPAL TUNNEL RELEASE Left 2005   COLECTOMY     FLEXIBLE SIGMOIDOSCOPY N/A 01/14/2020   Procedure: FLEXIBLE  SIGMOIDOSCOPY;  Surgeon: Doran Stabler, MD;  Location: [facility];  Service: Gastroenterology;  Laterality: N/A;   foot spur Left 2007   foot   LAPAROTOMY N/A 01/14/2020   Procedure: OPEN RIGHT COLECTOMY, EXPLORATORY LAPAROTOMY;  Surgeon: Michael Boston, MD;  Location: [facility];  Service: General;  Laterality: N/A;   PERIPHERAL VASCULAR BALLOON  ANGIOPLASTY Left 08/03/2019   Procedure: PERIPHERAL VASCULAR BALLOON ANGIOPLASTY;  Surgeon: Lorretta Harp, MD;  Location: [facility];  Service: Cardiovascular;  Laterality: Left;  SFA/ popliteal    Family History  Problem Relation Age of Onset   Heart disease Sister        pacemaker   Colon cancer Neg Hx    Esophageal cancer Neg Hx    Rectal cancer Neg Hx    Stomach cancer Neg Hx     Social History   Socioeconomic History   Marital status: Divorced    Spouse name: Not on file   Number of children: Not on file   Years of education: Not on file   Highest education level: Not on file  Occupational History   Not on file  Tobacco Use   Smoking status: Current Every Day Smoker    Packs/day: 0.50    Types: Cigarettes   Smokeless tobacco: Former Systems developer   Tobacco comment: about half a pack per day  Vaping Use   Vaping Use: Never used  Substance and Sexual Activity   Alcohol use: Yes    Comment: rare   Drug use: No   Sexual activity: Not on file  Other Topics Concern   Not on file  Social History Narrative   Not on file   Social Determinants of Health   Financial Resource Strain:    Difficulty of Paying Living Expenses:   Food Insecurity:    Worried About Charity fundraiser in the Last Year:    Arboriculturist in the Last Year:   Transportation Needs:    Film/video editor (Medical):    Lack of Transportation (Non-Medical):   Physical Activity:    Days of Exercise per Week:    Minutes of Exercise per Session:   Stress:    Feeling of Stress :   Social Connections:    Frequency of Communication with Friends and Family:    Frequency of Social Gatherings with Friends and Family:    Attends Religious Services:    Active Member of Clubs or Organizations:    Attends Music therapist:    Marital Status:   Intimate Partner Violence:    Fear of Current or Ex-Partner:    Emotionally Abused:    Physically  Abused:    Sexually Abused:     Outpatient Medications Prior to Visit  Medication Sig Dispense Refill   acetaminophen (TYLENOL) 500 MG tablet Take 1,000 mg by mouth every 6 (six) hours as needed for mild pain.     aspirin EC 81 MG tablet Take 81 mg by mouth daily.     carvedilol (COREG) 25 MG tablet TAKE 1 TABLET(25 MG) BY MOUTH TWICE DAILY 180 tablet 3   cilostazol (PLETAL) 50 MG tablet TAKE 1 TABLET(50 MG) BY MOUTH TWICE DAILY 180 tablet 2   clopidogrel (PLAVIX) 75 MG tablet Take 1 tablet (75 mg total) by mouth daily with breakfast. 30 tablet 11   cyclobenzaprine (FLEXERIL) 10 MG tablet Take 1 tablet (10 mg total) by mouth 3 (three) times daily as needed. 90 tablet 0  ezetimibe (ZETIA) 10 MG tablet Take 1 tablet (10 mg total) by mouth daily. 90 tablet 1   Fish Oil-Cholecalciferol (FISH OIL + D3) 1200-1000 MG-UNIT CAPS Take 1 capsule by mouth daily.     losartan (COZAAR) 100 MG tablet Take 1 tablet (100 mg total) by mouth daily. 90 tablet 3   Melatonin 12 MG TABS Take 12 mg by mouth at bedtime.     Multiple Vitamin (MULTIVITAMIN WITH MINERALS) TABS tablet Take 1 tablet by mouth daily. Women's One-A-Day      nicotine (NICODERM CQ) 7 mg/24hr patch Place 1 patch (7 mg total) onto the skin daily. Use after you have completed the 14 mg patches 28 patch 0   oxyCODONE (OXY IR/ROXICODONE) 5 MG immediate release tablet Take 1-2 tablets (5-10 mg total) by mouth every 6 (six) hours as needed for moderate pain or severe pain. 30 tablet 0   pantoprazole (PROTONIX) 40 MG tablet Take 1 tablet (40 mg total) by mouth daily. 30 tablet 3   rosuvastatin (CRESTOR) 10 MG tablet Take 1 tablet (10 mg total) by mouth at bedtime. 90 tablet 1   nicotine (NICODERM CQ) 14 mg/24hr patch Place 1 patch (14 mg total) onto the skin daily. For 6 weeks 42 patch 0   metoprolol tartrate (LOPRESSOR) 25 MG tablet Take 1 tablet by mouth 2 hours prior to Cardiac CT 1 tablet 0   No facility-administered medications  prior to visit.    Allergies  Allergen Reactions   Amlodipine Other (See Comments)    Ringing in ears   Hydrochlorothiazide     ROS Review of Systems  Constitutional: Negative.   HENT: Negative.   Eyes: Negative.   Respiratory: Negative.   Cardiovascular: Negative.   Gastrointestinal: Negative for abdominal distention and abdominal pain.  Endocrine: Negative.   Genitourinary: Negative.   Musculoskeletal: Negative.   Allergic/Immunologic: Negative.   Neurological: Negative.   Hematological: Negative.   Psychiatric/Behavioral: Negative.       Objective:    Physical Exam Constitutional:      Appearance: Normal appearance.  HENT:     Head: Normocephalic.     Nose: Nose normal.     Mouth/Throat:     Mouth: Mucous membranes are moist.  Cardiovascular:     Rate and Rhythm: Normal rate and regular rhythm.     Pulses: Normal pulses.  Abdominal:     General: Bowel sounds are normal. There is no distension.  Skin:    General: Skin is warm.  Neurological:     General: No focal deficit present.     Mental Status: She is alert. Mental status is at baseline.  Psychiatric:        Mood and Affect: Mood normal.        Thought Content: Thought content normal.        Judgment: Judgment normal.     BP 136/82 (BP Location: Right Arm, Patient Position: Sitting, Cuff Size: Normal) Comment: manually   Pulse 75    Temp 98.7 F (37.1 C) (Oral)    Resp 16    Ht 5\' 6"  (1.676 m)    Wt 150 lb (68 kg)    SpO2 98%    BMI 24.21 kg/m  Wt Readings from Last 3 Encounters:  04/05/20 150 lb (68 kg)  03/01/20 146 lb (66.2 kg)  02/18/20 145 lb (65.8 kg)     Health Maintenance Due  Topic Date Due   DEXA SCAN  Never done   PNA vac Low  Risk Adult (1 of 2 - PCV13) 05/22/2018   MAMMOGRAM  11/13/2019    There are no preventive care reminders to display for this patient.  Lab Results  Component Value Date   TSH 1.860 01/05/2020   Lab Results  Component Value Date   WBC 10.7 (H)  01/22/2020   HGB 9.7 (L) 01/22/2020   HCT 29.0 (L) 01/22/2020   MCV 97.0 01/22/2020   PLT 425 (H) 01/22/2020   Lab Results  Component Value Date   NA 141 04/05/2020   K 4.3 04/05/2020   CO2 22 04/05/2020   GLUCOSE 128 (H) 04/05/2020   BUN 13 04/05/2020   CREATININE 0.71 04/05/2020   BILITOT 0.4 01/22/2020   ALKPHOS 58 01/22/2020   AST 27 01/22/2020   ALT 34 01/22/2020   PROT 5.3 (L) 01/22/2020   ALBUMIN 2.4 (L) 01/22/2020   CALCIUM 9.6 04/05/2020   ANIONGAP 5 01/22/2020   Lab Results  Component Value Date   CHOL 153 06/08/2019   Lab Results  Component Value Date   HDL 64 06/08/2019   Lab Results  Component Value Date   LDLCALC 73 06/08/2019   Lab Results  Component Value Date   TRIG 87 06/08/2019   Lab Results  Component Value Date   CHOLHDL 2.4 06/08/2019   Lab Results  Component Value Date   HGBA1C 6.8 (H) 01/14/2020      Assessment & Plan:   Problem List Items Addressed This Visit      Cardiovascular and Mediastinum   Essential hypertension - Primary   Relevant Orders   Urinalysis Dipstick (Completed)   Basic Metabolic Panel (Completed)   Magnesium (Completed)     Digestive   GERD (gastroesophageal reflux disease)    Other Visit Diagnoses    Hyperlipidemia, unspecified hyperlipidemia type         Essential hypertension Ms. Dougher appears to be doing very well.  She is following a low-sodium, low-fat diet and blood pressure is very well controlled.  No medication changes are warranted at this time. - Urinalysis Dipstick - Basic Metabolic Panel - Magnesium  Hyperlipidemia, unspecified hyperlipidemia type We will repeat lipid panel at 19-month follow-up.  Continue medications as prescribed.  Gastroesophageal reflux disease without esophagitis Patient states that GERD has been well controlled lately.  She has been monitoring her diet closely to control any symptoms.  Cecal volvulus s/p colectomy 01/14/2020: Patient is doing very well.  She  was followed by Dr. Johney Maine with [facility] surgery.  Patient abdominal incision is approximated and appears to have healed well.  Patient is not having any abdominal pain, nausea, vomiting, or diarrhea at this time.  Advised to follow-up with gastroenterology as scheduled.  Follow-up: Return in about 3 months (around 07/06/2020).    [name]  APRN, MSN, FNP-C [facility] [address] [place], [place] [address] [phone]

## 2020-04-25 ENCOUNTER — Encounter (HOSPITAL_COMMUNITY): Payer: Self-pay | Admitting: Cardiovascular Disease

## 2020-05-02 ENCOUNTER — Other Ambulatory Visit: Payer: Self-pay | Admitting: Family Medicine

## 2020-05-05 ENCOUNTER — Other Ambulatory Visit: Payer: Self-pay

## 2020-05-05 ENCOUNTER — Encounter: Payer: Self-pay | Admitting: Cardiology

## 2020-05-05 ENCOUNTER — Telehealth: Payer: Self-pay

## 2020-05-05 ENCOUNTER — Ambulatory Visit (INDEPENDENT_AMBULATORY_CARE_PROVIDER_SITE_OTHER): Payer: Medicare Other | Admitting: Cardiology

## 2020-05-05 VITALS — BP 183/94 | HR 69 | Ht 66.0 in | Wt 155.6 lb

## 2020-05-05 DIAGNOSIS — E785 Hyperlipidemia, unspecified: Secondary | ICD-10-CM

## 2020-05-05 DIAGNOSIS — I10 Essential (primary) hypertension: Secondary | ICD-10-CM | POA: Diagnosis not present

## 2020-05-05 DIAGNOSIS — I739 Peripheral vascular disease, unspecified: Secondary | ICD-10-CM

## 2020-05-05 DIAGNOSIS — I2510 Atherosclerotic heart disease of native coronary artery without angina pectoris: Secondary | ICD-10-CM | POA: Diagnosis not present

## 2020-05-05 DIAGNOSIS — Z716 Tobacco abuse counseling: Secondary | ICD-10-CM

## 2020-05-05 MED ORDER — DOXAZOSIN MESYLATE 2 MG PO TABS
2.0000 mg | ORAL_TABLET | Freq: Every day | ORAL | 3 refills | Status: DC
Start: 2020-05-05 — End: 2020-05-13

## 2020-05-05 NOTE — Patient Instructions (Addendum)
Please check your blood pressure twice daily for one week and sent Dr. Radford Pax a message with your readings.   Medication Instructions:  Your physician has recommended you make the following change in your medication:  1) START taking Doxazosin 2 mg every night  *If you need a refill on your cardiac medications before your next appointment, please call your pharmacy*   Lab Work: Fasting lipids, CMET, and HbA1C If you have labs (blood work) drawn today and your tests are completely normal, you will receive your results only by: Marland Kitchen MyChart Message (if you have MyChart) OR . A paper copy in the mail If you have any lab test that is abnormal or we need to change your treatment, we will call you to review the results.  Follow-Up: At [facility], you and your health needs are our priority.  As part of our continuing mission to provide you with exceptional heart care, we have created designated Provider Care Teams.  These Care Teams include your primary Cardiologist (physician) and Advanced Practice Providers (APPs -  Physician Assistants and Nurse Practitioners) who all work together to provide you with the care you need, when you need it.  Your next appointment:   6 month(s)  The format for your next appointment:   In Person  Provider:   You may see Fransico Him, MD or one of the following Advanced Practice Providers on your designated Care Team:    Melina Copa, PA-C  Ermalinda Barrios, PA-C    Other Instructions Follow up with PharmD in the hypertension clinic in two weeks.

## 2020-05-05 NOTE — Progress Notes (Signed)
Cardiology Office Note    Date:  05/06/2020   ID:  DENIM START, DOB 09/08/1950, MRN [id number]  PCP:  Dorena Dew, FNP  Cardiologist: Fransico Him, MD EPS: None  Chief Complaint  Patient presents with  . Coronary Artery Disease  . Hypertension  . Hyperlipidemia    History of Present Illness:  Margaret Kelly is a 69 y.o. female  with hx of HTN, HLD, tobacco abuse and PAD 08/03/19 S/P successful orbital atherectomy, chocolate balloon angioplasty followed by [facility] of the distal left SFA and highly calcified left popliteal artery using a NAV 6 distal protection with excellent angiographic result.   Patient is status post emergent ileocecectomy for cecal volvulus with ischemia on Jan 14, 2020.  She was seen back in the office by Estella Husk, PA in June and was doing well.  She had cut back on her tobacco to 1/2ppd using Chantix. Her EKG was abnormal with new TWI in the anterolateral leads and was set up for coronary CTA.  Coronary CT showed a high coronary Ca score of 751 which was 97th% for age and sex matched controls.  There was moderate CAD of the proximal RCA 50-69% and long calcified plaque in the proximal LAD with 25-49%.  Coronary FFR did not show any flow limiting lesions and medical management was recommended.    She is here today for followup and is doing well.  She denies any chest pain or pressure, SOB, DOE, PND, orthopnea, LE edema, dizziness, palpitations or syncope. She is compliant with her meds and is tolerating meds with no SE.    Past Medical History:  Diagnosis Date  . Anxiety   . Blood transfusion without reported diagnosis 1982  . CAD (coronary artery disease), native coronary artery 03/2020   oronary CT showed a high coronary Ca score of 751 which was 97th% for age and sex matched controls.  There was moderate CAD of the proximal RCA 50-69% and long calcified plaque in the proximal LAD with 25-49%.  Coronary FFR did not show any flow limiting lesions and  medical management was recommended.    Marland Kitchen GERD (gastroesophageal reflux disease)   . History of hepatitis C 10/13/2015  . Hyperlipidemia LDL goal <100 10/13/2015  . Hypertension   . Muscle spasm of left lower extremity 10/13/2015  . Nerve damage 2001   left side as result of fall  . PAD (peripheral artery disease) ([place])   . Pain of left hip joint 02/21/2018  . Plantar wart     Past Surgical History:  Procedure Laterality Date  . ABDOMINAL AORTOGRAM W/LOWER EXTREMITY Bilateral 03/26/2019   Procedure: ABDOMINAL AORTOGRAM W/LOWER EXTREMITY;  Surgeon: Lorretta Harp, MD;  Location: [facility];  Service: Cardiovascular;  Laterality: Bilateral;  . ABDOMINAL AORTOGRAM W/LOWER EXTREMITY Bilateral 08/03/2019   Procedure: ABDOMINAL AORTOGRAM W/LOWER EXTREMITY;  Surgeon: Lorretta Harp, MD;  Location: [facility] [facility];  Service: Cardiovascular;  Laterality: Bilateral;  . ABDOMINAL HYSTERECTOMY  1982  . CARPAL TUNNEL RELEASE Left 2005  . COLECTOMY    . FLEXIBLE SIGMOIDOSCOPY N/A 01/14/2020   Procedure: FLEXIBLE SIGMOIDOSCOPY;  Surgeon: Doran Stabler, MD;  Location: [facility];  Service: Gastroenterology;  Laterality: N/A;  . foot spur Left 2007   foot  . LAPAROTOMY N/A 01/14/2020   Procedure: OPEN RIGHT COLECTOMY, EXPLORATORY LAPAROTOMY;  Surgeon: Michael Boston, MD;  Location: [facility];  Service: General;  Laterality: N/A;  . PERIPHERAL VASCULAR BALLOON ANGIOPLASTY Left 08/03/2019  Procedure: PERIPHERAL VASCULAR BALLOON ANGIOPLASTY;  Surgeon: Lorretta Harp, MD;  Location: [place] [facility];  Service: Cardiovascular;  Laterality: Left;  SFA/ popliteal    Current Medications: Current Meds  Medication Sig  . acetaminophen (TYLENOL) 500 MG tablet Take 1,000 mg by mouth every 6 (six) hours as needed for mild pain.  Marland Kitchen aspirin EC 81 MG tablet Take 81 mg by mouth daily.  . carvedilol (COREG) 25 MG tablet TAKE 1 TABLET(25 MG) BY MOUTH TWICE DAILY  . cilostazol (PLETAL) 50 MG tablet  TAKE 1 TABLET(50 MG) BY MOUTH TWICE DAILY  . clopidogrel (PLAVIX) 75 MG tablet Take 1 tablet (75 mg total) by mouth daily with breakfast.  . cyclobenzaprine (FLEXERIL) 10 MG tablet Take 1 tablet (10 mg total) by mouth 3 (three) times daily as needed.  . ezetimibe (ZETIA) 10 MG tablet Take 1 tablet (10 mg total) by mouth daily.  . Fish Oil-Cholecalciferol (FISH OIL + D3) 1200-1000 MG-UNIT CAPS Take 1 capsule by mouth daily.  . Glucosamine HCl (GLUCOSAMINE PO) Take 2,000 mg by mouth daily.  Marland Kitchen losartan (COZAAR) 100 MG tablet Take 1 tablet (100 mg total) by mouth daily.  . Melatonin 12 MG TABS Take 12 mg by mouth at bedtime.  . Multiple Vitamin (MULTIVITAMIN WITH MINERALS) TABS tablet Take 1 tablet by mouth daily. Women's One-A-Day   . nicotine (NICODERM CQ) 14 mg/24hr patch Place 1 patch (14 mg total) onto the skin daily. For 6 weeks  . nicotine (NICODERM CQ) 7 mg/24hr patch Place 1 patch (7 mg total) onto the skin daily. Use after you have completed the 14 mg patches  . pantoprazole (PROTONIX) 40 MG tablet Take 1 tablet (40 mg total) by mouth daily.  . rosuvastatin (CRESTOR) 10 MG tablet Take 1 tablet (10 mg total) by mouth at bedtime.     Allergies:   Amlodipine and Hydrochlorothiazide   Social History   Socioeconomic History  . Marital status: Divorced    Spouse name: Not on file  . Number of children: Not on file  . Years of education: Not on file  . Highest education level: Not on file  Occupational History  . Not on file  Tobacco Use  . Smoking status: Former Smoker    Packs/day: 0.50    Types: Cigarettes  . Smokeless tobacco: Former Systems developer  . Tobacco comment: about half a pack per day  Vaping Use  . Vaping Use: Never used  Substance and Sexual Activity  . Alcohol use: Yes    Comment: rare  . Drug use: No  . Sexual activity: Not on file  Other Topics Concern  . Not on file  Social History Narrative  . Not on file   Social Determinants of Health   Financial Resource  Strain:   . Difficulty of Paying Living Expenses: Not on file  Food Insecurity:   . Worried About Charity fundraiser in the Last Year: Not on file  . Ran Out of Food in the Last Year: Not on file  Transportation Needs:   . Lack of Transportation (Medical): Not on file  . Lack of Transportation (Non-Medical): Not on file  Physical Activity:   . Days of Exercise per Week: Not on file  . Minutes of Exercise per Session: Not on file  Stress:   . Feeling of Stress : Not on file  Social Connections:   . Frequency of Communication with Friends and Family: Not on file  . Frequency of Social Gatherings  with Friends and Family: Not on file  . Attends Religious Services: Not on file  . Active Member of Clubs or Organizations: Not on file  . Attends Archivist Meetings: Not on file  . Marital Status: Not on file     Family History:  The patient's   family history includes Heart disease in her sister.   ROS:   Please see the history of present illness.    ROS All other systems reviewed and are negative.   PHYSICAL EXAM:   VS:  BP (!) 183/94   Pulse 69   Ht 5\' 6"  (1.676 m)   Wt 155 lb 9.6 oz (70.6 kg)   BMI 25.11 kg/m   Physical Exam    GEN: Well nourished, well developed in no acute distress HEENT: Normal NECK: No JVD; No carotid bruits LYMPHATICS: No lymphadenopathy CARDIAC:RRR, no murmurs, rubs, gallops RESPIRATORY:  Clear to auscultation without rales, wheezing or rhonchi  ABDOMEN: Soft, non-tender, non-distended MUSCULOSKELETAL:  No edema; No deformity  SKIN: Warm and dry NEUROLOGIC:  Alert and oriented x 3 PSYCHIATRIC:  Normal affect   Wt Readings from Last 3 Encounters:  05/05/20 155 lb 9.6 oz (70.6 kg)  04/05/20 150 lb (68 kg)  03/01/20 146 lb (66.2 kg)      Studies/Labs Reviewed:   EKG:  EKG is not  ordered today.    Recent Labs: 01/05/2020: TSH 1.860 01/22/2020: ALT 34; Hemoglobin 9.7; Platelets 425 04/05/2020: BUN 13; Creatinine, Ser 0.71; Magnesium  1.8; Potassium 4.3; Sodium 141   Lipid Panel    Component Value Date/Time   CHOL 153 06/08/2019 1000   TRIG 87 06/08/2019 1000   HDL 64 06/08/2019 1000   CHOLHDL 2.4 06/08/2019 1000   CHOLHDL 4.5 03/22/2017 1116   VLDL 40 (H) 03/22/2017 1116   LDLCALC 73 06/08/2019 1000    Additional studies/ records that were reviewed today include:  PERIPHERAL VASCULAR BALLOON ANGIOPLASTY  ABDOMINAL AORTOGRAM W/LOWER EXTREMITY    Procedures Performed:               1.  Ultrasound-guided right common femoral access               2.  Contralateral access (secondary catheter placement)               3.  Placement of an NAV 6 distal protection device in the tibioperoneal trunk               4.  Diamondback orbital rotational atherectomy distal left SFA and popliteal artery               5.  DCB distal left SFA and popliteal artery               6.  Right common femoral angiogram, Mynx closure   Final Impression: Successful orbital atherectomy, chocolate balloon angioplasty followed by [facility] of the distal left SFA and highly calcified left popliteal artery using a NAV 6 distal protection with excellent angiographic result.  The groin was hemostatically sealed with a MYNX closure device.  Patient received 300 mg of p.o. Plavix.  She left lab in stable condition.  She will be gently hydrated overnight, discharged home in the morning on dual antiplatelet therapy.  We will obtain lower extremity arterial Doppler studies in our [facility] line office next week and I will see her back the week after for follow-up.    ASSESSMENT:    1. Coronary artery disease involving native coronary  artery of native heart without angina pectoris   2. Essential hypertension   3. Peripheral arterial disease ([place])   4. Hyperlipidemia LDL goal <100   5. Tobacco abuse counseling      PLAN:  In order of problems listed above:  1.  ASCAD -coronary CT showed a high coronary Ca score of 751 which was 97th% for age and sex matched  controls.  There was moderate CAD of the proximal RCA 50-69% and long calcified plaque in the proximal LAD with 25-49%.   -she has not had any anginal symptoms -continue ASA 81mg  daily, Plavix 75mg  daily, BB and statin -her HbA1C was 6.8% in May so I will recheck it  2.  HTN -BP is poorly controlled on exam -at home her SBP runs 124-152mmHg -continue Losartan 100mg  daily and Carvedilol 25mg  BID -she did not tolerate HCTZ in the past -will add Doxazosin 2mg  qhs -check BP twice daily for a week and call with results   3.  PAD  -status post successful orbital atherectomy talk with balloon angioplasty followed by [facility] of the distal left SFA and highly calcified left popliteal artery with excellent result 08/03/2019- some trouble with right leg but not left.  - F/u with Dr. Gwenlyn Found -continue ASA and Plavix  4.  Hyperlipidemia -LDL goal < 70 -LDL 73 06/2019 -repeat FLP and ALT -continue Crestor 10mg  daily and Zetia 10mg  daily  5.  Tobacco abuse -she is wearing the nicotine patch -once in a great while she will take the patch off and smoke a cigarette -smoking cessation discussed.     Medication Adjustments/Labs and Tests Ordered: Current medicines are reviewed at length with the patient today.  Concerns regarding medicines are outlined above.  Medication changes, Labs and Tests ordered today are listed in the Patient Instructions below. Patient Instructions  Please check your blood pressure twice daily for one week and sent Dr. Radford Pax a message with your readings.   Medication Instructions:  Your physician has recommended you make the following change in your medication:  1) START taking Doxazosin 2 mg every night  *If you need a refill on your cardiac medications before your next appointment, please call your pharmacy*   Lab Work: Fasting lipids, CMET, and HbA1C If you have labs (blood work) drawn today and your tests are completely normal, you will receive your results only  by: Marland Kitchen MyChart Message (if you have MyChart) OR . A paper copy in the mail If you have any lab test that is abnormal or we need to change your treatment, we will call you to review the results.  Follow-Up: At [facility], you and your health needs are our priority.  As part of our continuing mission to provide you with exceptional heart care, we have created designated Provider Care Teams.  These Care Teams include your primary Cardiologist (physician) and Advanced Practice Providers (APPs -  Physician Assistants and Nurse Practitioners) who all work together to provide you with the care you need, when you need it.  Your next appointment:   6 month(s)  The format for your next appointment:   In Person  Provider:   You may see Fransico Him, MD or one of the following Advanced Practice Providers on your designated Care Team:    Melina Copa, PA-C  Ermalinda Barrios, PA-C    Other Instructions Follow up with PharmD in the hypertension clinic in two weeks.      Signed, Fransico Him, MD  05/06/2020 7:58 AM  [facility] [address], [place], [place]  [address] Phone: [phone]; Fax: [phone]

## 2020-05-05 NOTE — Telephone Encounter (Signed)
Received a call from [place] patient has bilateral lower extremity  PAD.  Full test will be sent to the office in the next 3 to 4 weeks

## 2020-05-13 MED ORDER — DOXAZOSIN MESYLATE 4 MG PO TABS: 4.0000 mg | ORAL_TABLET | Freq: Every day | ORAL | 3 refills | Status: DC

## 2020-05-24 DIAGNOSIS — I10 Essential (primary) hypertension: Secondary | ICD-10-CM

## 2020-05-25 MED ORDER — CHLORTHALIDONE 25 MG PO TABS: 25.0000 mg | ORAL_TABLET | Freq: Every day | ORAL | 3 refills | Status: DC

## 2020-05-29 NOTE — Progress Notes (Unsigned)
Patient ID: FLETA BORGESON                 DOB: 1951/08/03                      MRN: [id number]     HPI: Margaret Kelly is a 69 y.o. female referred by Dr. Radford Pax to HTN clinic. PMH is significant for CAD, HTN, HLD, anxiety, tobacco use.  Patient last saw Dr. Radford Pax on 05/05/2020. At that time, blood pressure was poorly controlled on losartan 100 mg daily and carvedilol 25 mg BID. Patient was started on doxazosin 2 mg at bedtime. Patient continued to have high readings at home, so Dr. Radford Pax further increased doxazosin dose to 4 mg at bedtime on 9/10 and added chlorthalidone 25 mg daily on 9/21.   Patient presents to HTN clinic today for follow up. ***   Assess: -Tolerating chlorthalidone? (orthostatic hypotension, dizziness)  -Tolerating doxazosin? -Headaches, burry vision, fatigue, swelling, SOB, chest pain, palpitations -Improved home BP? -Exercise -Diet -Office BP  Plan if BP still elevated: -retry amlodipine at low dose (2.5 mg)? -initiate spiro 12.5 mg if labs normal -inc doxazosin to 8 mg qHS   **Patient changed appointment to 9/30 for f/u labs and did not e-check in for 9/27 appointment**    Current HTN meds: Losartan 100 mg daily. Chlorthalidone 25 mg daily. Carvedilol 25 mg twice daily. Doxazosin 4 mg at bedtime. Previously tried: Hydrochlorothiazide (orthostatic hypotension, dizziness). Lisinopril-HCTZ (cough). Amlodipine (tried twice in past, 2017 and 2018, patient does not remember reaction, "ringing in ears" on allergy list) BP goal: <130/80  Family History: Heart disease (sister)  Social History: Former smoker  Diet: ***  Exercise: ***  Home BP readings: ***  Wt Readings from Last 3 Encounters:  05/05/20 155 lb 9.6 oz (70.6 kg)  04/05/20 150 lb (68 kg)  03/01/20 146 lb (66.2 kg)   BP Readings from Last 3 Encounters:  05/05/20 (!) 183/94  04/05/20 136/82  03/18/20 (!) 178/78   Pulse Readings from Last 3 Encounters:  05/05/20 69  04/05/20 75    03/18/20 71    Renal function: CrCl cannot be calculated (Patient's most recent lab result is older than the maximum 21 days allowed.).  Past Medical History:  Diagnosis Date  . Anxiety   . Blood transfusion without reported diagnosis 1982  . CAD (coronary artery disease), native coronary artery 03/2020   oronary CT showed a high coronary Ca score of 751 which was 97th% for age and sex matched controls.  There was moderate CAD of the proximal RCA 50-69% and long calcified plaque in the proximal LAD with 25-49%.  Coronary FFR did not show any flow limiting lesions and medical management was recommended.    Marland Kitchen GERD (gastroesophageal reflux disease)   . History of hepatitis C 10/13/2015  . Hyperlipidemia LDL goal <100 10/13/2015  . Hypertension   . Muscle spasm of left lower extremity 10/13/2015  . Nerve damage 2001   left side as result of fall  . PAD (peripheral artery disease) ([place])   . Pain of left hip joint 02/21/2018  . Plantar wart     Current Outpatient Medications on File Prior to Visit  Medication Sig Dispense Refill  . acetaminophen (TYLENOL) 500 MG tablet Take 1,000 mg by mouth every 6 (six) hours as needed for mild pain.    Marland Kitchen aspirin EC 81 MG tablet Take 81 mg by mouth daily.    . carvedilol (COREG)  25 MG tablet TAKE 1 TABLET(25 MG) BY MOUTH TWICE DAILY 180 tablet 3  . chlorthalidone (HYGROTON) 25 MG tablet Take 1 tablet (25 mg total) by mouth daily. 90 tablet 3  . cilostazol (PLETAL) 50 MG tablet TAKE 1 TABLET(50 MG) BY MOUTH TWICE DAILY 180 tablet 2  . clopidogrel (PLAVIX) 75 MG tablet Take 1 tablet (75 mg total) by mouth daily with breakfast. 30 tablet 11  . cyclobenzaprine (FLEXERIL) 10 MG tablet Take 1 tablet (10 mg total) by mouth 3 (three) times daily as needed. 90 tablet 0  . doxazosin (CARDURA) 4 MG tablet Take 1 tablet (4 mg total) by mouth daily. 90 tablet 3  . ezetimibe (ZETIA) 10 MG tablet Take 1 tablet (10 mg total) by mouth daily. 90 tablet 1  . Fish  Oil-Cholecalciferol (FISH OIL + D3) 1200-1000 MG-UNIT CAPS Take 1 capsule by mouth daily.    . Glucosamine HCl (GLUCOSAMINE PO) Take 2,000 mg by mouth daily.    Marland Kitchen losartan (COZAAR) 100 MG tablet Take 1 tablet (100 mg total) by mouth daily. 90 tablet 3  . Melatonin 12 MG TABS Take 12 mg by mouth at bedtime.    . Multiple Vitamin (MULTIVITAMIN WITH MINERALS) TABS tablet Take 1 tablet by mouth daily. Women's One-A-Day     . nicotine (NICODERM CQ) 14 mg/24hr patch Place 1 patch (14 mg total) onto the skin daily. For 6 weeks 42 patch 0  . nicotine (NICODERM CQ) 7 mg/24hr patch Place 1 patch (7 mg total) onto the skin daily. Use after you have completed the 14 mg patches 28 patch 0  . pantoprazole (PROTONIX) 40 MG tablet Take 1 tablet (40 mg total) by mouth daily. 30 tablet 3  . rosuvastatin (CRESTOR) 10 MG tablet Take 1 tablet (10 mg total) by mouth at bedtime. 90 tablet 1   No current facility-administered medications on file prior to visit.    Allergies  Allergen Reactions  . Amlodipine Other (See Comments)    Ringing in ears  . Hydrochlorothiazide     There were no vitals taken for this visit.   Assessment/Plan:  1. Hypertension - Patient's office BP is *** goal of <130/80. ***      Thank you,  Esmeralda Links (PharmD Candidate 2022)  Ramond Dial, Pharm.D, BCPS, CPP [place]  [address], [place],  [address]  Phone: [phone]; Fax: [phone]

## 2020-05-30 ENCOUNTER — Other Ambulatory Visit: Payer: Medicare Other

## 2020-05-30 ENCOUNTER — Ambulatory Visit: Payer: Medicare Other

## 2020-05-30 ENCOUNTER — Telehealth: Payer: Self-pay

## 2020-05-30 NOTE — Telephone Encounter (Signed)
Called patient to reschedule appointment with pharmacy team. Patient was originally scheduled for both labs and pharmacy appointment on 9/27. However, when labs were rescheduled to 9/30, patient got confused and believed the pharmacy appointment was also rescheduled for 9/30. Successfully contacted patient and rescheduled her to see pharmacy team on 9/30 at 8:30 AM after her labs.   Esmeralda Links (PharmD Candidate 2022)

## 2020-05-30 NOTE — Progress Notes (Signed)
Patient ID: Margaret Kelly                 DOB: [birth year]                      MRN: [id number]     HPI: Margaret Kelly is a 69 y.o. female referred by Dr. Radford Pax to HTN clinic. PMH is significant for CAD, HTN, HLD, anxiety, tobacco use.  Patient last saw Dr. Radford Pax on 05/05/2020. At that time, blood pressure was poorly controlled (183/94) on losartan 100 mg daily and carvedilol 25 mg BID. Patient was started on doxazosin 2 mg at bedtime. Patient continued to have high readings at home (140s-150s/70s-80s), so Dr. Radford Pax further increased doxazosin dose to 4 mg at bedtime on 9/10 and added chlorthalidone 25 mg daily on 9/21.  Patient presents to HTN clinic today for follow up. She complains of lightheadedness, orthostatic hypotension, poor appetite, fatigue, low energy, and feeling drained ever since starting doxazosin. She does not feel like herself, as she is normally very energetic. She reports her symptoms worsened when doxazosin dose was increased and have been the worst over the past 3 days. She also complains of constant ringing in her ears, which was never a problem before she started getting treated for HTN. She reports the ringing worsens every day after she takes her meds. Patient did not notice any drastic differences in her symptoms when chlorthalidone was added. Patient reports orthostatic hypotension when she took HCTZ in the past, stating that her surgery prevented her from standing up quickly but she still felt dizzy. Patient also reports chronic cough on lisinopril and believes she experienced dizziness when on amlodipine.   Patient did not bring her home BP readings today but reports that her morning readings are usually higher (systolic 017C-944H) and her afternoon readings are lower (systolic 675-916). Patient takes her morning medications at around 8-9 AM and her evening medications at around 9:30-11 PM, depending on when she goes to bed. Office BP today 160/86 and HR 90. She has not  taken her morning medications today.  Patient eats a generally healthy diet full of fresh fruits and vegetables. She is also pretty active and takes walks, but her physical activity is limited by her knee pain. Patient is still smoking about 5-6 cigarettes per week, but is working on quitting. She has been using nicotine patch for about 3 weeks now. Patient also have chronic pain in her back, hips, and knees. She reports that taking Tylenol helps relieve pain.  Current HTN meds: Losartan 100 mg daily. Chlorthalidone 25 mg daily. Carvedilol 25 mg twice daily. Doxazosin 4 mg at bedtime. Previously tried: Hydrochlorothiazide (orthostatic hypotension, dizziness). Lisinopril-HCTZ (cough). Amlodipine (dizziness, ringing in ears). BP goal: <130/80  Family History: Heart disease (sister)  Social History: Smokes 5-6 cigarettes per week; uses nicotine patch (takes it off at night due to vivid bad dreams)  Diet: Eats plenty of fresh fruits and vegetables, which she grows herself. Enjoys pasta, chicken (baked), figs, stir fry, soups, salad. Enjoys ice cream, graham crackers w/ nutella. Drinks flavored water, 1 cup regular coffee daily (otherwise decaf), and no soda. Does not eat fast foods. Only uses salt to cook but does not add salt afterwards; has tried Mrs. DASH before.  Exercise: Uses the pool in summer, walks around yard/house in winter, gardens, pulls hoses, walks her dog. Exercise is limited by knee pain. Sweats a lot  Home BP readings: Systolic 384Y-659D in mornings and  113-120 in afternoons  Wt Readings from Last 3 Encounters:  05/05/20 155 lb 9.6 oz (70.6 kg)  04/05/20 150 lb (68 kg)  03/01/20 146 lb (66.2 kg)   BP Readings from Last 3 Encounters:  06/02/20 (!) 160/86  05/05/20 (!) 183/94  04/05/20 136/82   Pulse Readings from Last 3 Encounters:  06/02/20 90  05/05/20 69  04/05/20 75    Renal function: CrCl cannot be calculated (Patient's most recent lab result is older than the  maximum 21 days allowed.).  Past Medical History:  Diagnosis Date  . Anxiety   . Blood transfusion without reported diagnosis 1982  . CAD (coronary artery disease), native coronary artery 03/2020   oronary CT showed a high coronary Ca score of 751 which was 97th% for age and sex matched controls.  There was moderate CAD of the proximal RCA 50-69% and long calcified plaque in the proximal LAD with 25-49%.  Coronary FFR did not show any flow limiting lesions and medical management was recommended.    Marland Kitchen GERD (gastroesophageal reflux disease)   . History of hepatitis C 10/13/2015  . Hyperlipidemia LDL goal <100 10/13/2015  . Hypertension   . Muscle spasm of left lower extremity 10/13/2015  . Nerve damage 2001   left side as result of fall  . PAD (peripheral artery disease) ([place])   . Pain of left hip joint 02/21/2018  . Plantar wart     Current Outpatient Medications on File Prior to Visit  Medication Sig Dispense Refill  . acetaminophen (TYLENOL) 500 MG tablet Take 1,000 mg by mouth every 6 (six) hours as needed for mild pain.    Marland Kitchen aspirin EC 81 MG tablet Take 81 mg by mouth daily.    . carvedilol (COREG) 25 MG tablet TAKE 1 TABLET(25 MG) BY MOUTH TWICE DAILY 180 tablet 3  . chlorthalidone (HYGROTON) 25 MG tablet Take 1 tablet (25 mg total) by mouth daily. 90 tablet 3  . cilostazol (PLETAL) 50 MG tablet TAKE 1 TABLET(50 MG) BY MOUTH TWICE DAILY 180 tablet 2  . clopidogrel (PLAVIX) 75 MG tablet Take 1 tablet (75 mg total) by mouth daily with breakfast. 30 tablet 11  . cyclobenzaprine (FLEXERIL) 10 MG tablet Take 1 tablet (10 mg total) by mouth 3 (three) times daily as needed. 90 tablet 0  . doxazosin (CARDURA) 4 MG tablet Take 1 tablet (4 mg total) by mouth daily. 90 tablet 3  . ezetimibe (ZETIA) 10 MG tablet Take 1 tablet (10 mg total) by mouth daily. 90 tablet 1  . Fish Oil-Cholecalciferol (FISH OIL + D3) 1200-1000 MG-UNIT CAPS Take 1 capsule by mouth daily.    . Glucosamine HCl (GLUCOSAMINE  PO) Take 2,000 mg by mouth daily.    Marland Kitchen losartan (COZAAR) 100 MG tablet Take 1 tablet (100 mg total) by mouth daily. 90 tablet 3  . Melatonin 12 MG TABS Take 12 mg by mouth at bedtime.    . Multiple Vitamin (MULTIVITAMIN WITH MINERALS) TABS tablet Take 1 tablet by mouth daily. Women's One-A-Day     . nicotine (NICODERM CQ) 14 mg/24hr patch Place 1 patch (14 mg total) onto the skin daily. For 6 weeks 42 patch 0  . nicotine (NICODERM CQ) 7 mg/24hr patch Place 1 patch (7 mg total) onto the skin daily. Use after you have completed the 14 mg patches 28 patch 0  . pantoprazole (PROTONIX) 40 MG tablet Take 1 tablet (40 mg total) by mouth daily. 30 tablet 3  . rosuvastatin (  CRESTOR) 10 MG tablet Take 1 tablet (10 mg total) by mouth at bedtime. 90 tablet 1   No current facility-administered medications on file prior to visit.    Allergies  Allergen Reactions  . Amlodipine Other (See Comments)    Ringing in ears  . Hydrochlorothiazide     Blood pressure (!) 160/86, pulse 90, SpO2 98 %.   Assessment/Plan:  1. Hypertension - Patient's office BP is above goal of <130/80, but patient has not taken her morning meds yet. Patient's reported afternoon readings seem to be within goal. Patient is having intolerable lightheadedness, orthostatic hypotension, and fatigue on doxazosin. Therefore, we will discontinue doxazosin and monitor for improvement in symptoms. No other medication changes for now (continue losartan 100 mg daily, chlorthalidone 25 mg daily, carvedilol 25 mg twice daily). We will monitor her home BP over next 1-2 weeks following discontinuation of doxazosin. Reviewed proper technique for checking BP and counseled patient to record readings to bring to next visit in 2 weeks. If BP remains elevated at that time, we can consider adding spironolactone (pending labs) or retrying amlodipine.   Thank you,  Esmeralda Links (PharmD Candidate 2022)  Ramond Dial, Pharm.D, BCPS, CPP [place]  [address], [place], [place] [address]  Phone: [phone]; Fax: [phone]

## 2020-06-02 ENCOUNTER — Other Ambulatory Visit: Payer: Medicare Other | Admitting: *Deleted

## 2020-06-02 ENCOUNTER — Ambulatory Visit (INDEPENDENT_AMBULATORY_CARE_PROVIDER_SITE_OTHER): Payer: Medicare Other | Admitting: Pharmacist

## 2020-06-02 ENCOUNTER — Other Ambulatory Visit: Payer: Self-pay

## 2020-06-02 VITALS — BP 160/86 | HR 90

## 2020-06-02 DIAGNOSIS — I10 Essential (primary) hypertension: Secondary | ICD-10-CM | POA: Diagnosis not present

## 2020-06-02 DIAGNOSIS — E785 Hyperlipidemia, unspecified: Secondary | ICD-10-CM | POA: Diagnosis not present

## 2020-06-02 DIAGNOSIS — I2510 Atherosclerotic heart disease of native coronary artery without angina pectoris: Secondary | ICD-10-CM | POA: Diagnosis not present

## 2020-06-02 NOTE — Patient Instructions (Addendum)
Great to see you today!  Here is what we discussed today:  1) STOP doxazosin. Let us know if your lightheadedness and fatigue improves.  2) CONTINUE your other blood pressure medications as you have been: losartan 100 mg daily, chlorthalidone 25 mg daily, carvedilol 25 mg twice daily  3) CONTINUE to monitor your home blood pressure readings and heart rate every day. Please bring these readings to your next visit with [place]. Remember to rest for at least 5 minutes before checking your blood pressure.  4) CONTINUE staying active as you are able to. Taking daily walks (20-30 minutes per day) is a great way to strengthen your heart. Continue to use the nicotine patch to help you quit smoking.   We will see you back in clinic 2 weeks!   Please call us if you have any questions or concerns: [phone]

## 2020-06-03 LAB — COMPREHENSIVE METABOLIC PANEL
ALT: 20 IU/L (ref 0–32)
AST: 21 IU/L (ref 0–40)
Albumin/Globulin Ratio: 1.8 (ref 1.2–2.2)
Albumin: 4.9 g/dL — ABNORMAL HIGH (ref 3.8–4.8)
Alkaline Phosphatase: 63 IU/L (ref 44–121)
BUN/Creatinine Ratio: 24 (ref 12–28)
BUN: 21 mg/dL (ref 8–27)
Bilirubin Total: 0.4 mg/dL (ref 0.0–1.2)
CO2: 27 mmol/L (ref 20–29)
Calcium: 10.5 mg/dL — ABNORMAL HIGH (ref 8.7–10.3)
Chloride: 96 mmol/L (ref 96–106)
Creatinine, Ser: 0.87 mg/dL (ref 0.57–1.00)
GFR calc Af Amer: 80 mL/min/{1.73_m2} (ref >59)
GFR calc non Af Amer: 69 mL/min/{1.73_m2} (ref >59)
Globulin, Total: 2.8 g/dL (ref 1.5–4.5)
Glucose: 163 mg/dL — ABNORMAL HIGH (ref 65–99)
Potassium: 4.3 mmol/L (ref 3.5–5.2)
Sodium: 137 mmol/L (ref 134–144)
Total Protein: 7.7 g/dL (ref 6.0–8.5)

## 2020-06-03 LAB — LIPID PANEL
Chol/HDL Ratio: 2.2 ratio (ref 0.0–4.4)
Cholesterol, Total: 139 mg/dL (ref 100–199)
HDL: 62 mg/dL (ref >39)
LDL Chol Calc (NIH): 53 mg/dL (ref 0–99)
Triglycerides: 139 mg/dL (ref 0–149)
VLDL Cholesterol Cal: 24 mg/dL (ref 5–40)

## 2020-06-03 LAB — HEMOGLOBIN A1C
Est. average glucose Bld gHb Est-mCnc: 154 mg/dL
Hgb A1c MFr Bld: 7 % — ABNORMAL HIGH (ref 4.8–5.6)

## 2020-06-06 ENCOUNTER — Other Ambulatory Visit: Payer: Self-pay | Admitting: Family Medicine

## 2020-06-06 NOTE — Progress Notes (Signed)
Notified patient concerning hemoglobin a1C, left message.    [name]  APRN, MSN, FNP-C Patient [facility] [address] [place], [place] [address] [phone]

## 2020-06-08 ENCOUNTER — Telehealth: Payer: Self-pay | Admitting: Family Medicine

## 2020-06-08 DIAGNOSIS — E1165 Type 2 diabetes mellitus with hyperglycemia: Secondary | ICD-10-CM

## 2020-06-08 MED ORDER — METFORMIN HCL 500 MG PO TABS: 500.0000 mg | ORAL_TABLET | Freq: Every day | ORAL | 1 refills | Status: DC

## 2020-06-08 NOTE — Telephone Encounter (Signed)
Margaret Kelly is a 69 year old female with a medical history significant for CAD, PAD, tobacco dependence,  essential hypertension and type 2 DM recently had an elevated hemoglobin a1C of 7.0, which is increased from previous. DMII was previously controlled by diet.  Carbohydrate modified diet discussed at length. Will add Metformin 500 mg daily with breakfast for greater control of DMII.   Left written information with clerical staff for Margaret's review as well. Will follow up in 3 months for hemoglobin a1C.    [name]  APRN, MSN, FNP-C Margaret Kelly [address] [place], [place] [address] [phone]

## 2020-06-16 NOTE — Progress Notes (Signed)
Patient ID: Margaret Kelly                 DOB: 10/26/1950                      MRN: [id number]     HPI: Margaret Kelly is a 69 y.o. female referred by Dr. Radford Pax to HTN clinic. PMH is significant for CAD, HTN, HLD, anxiety, tobacco use.  The patient was originally seen by Dr. Radford Pax 05/05/2020 at which time his BP was >409 systolic on losartan 100mg  daily and carvedilol 25mg  BID. As such, the patient was started on doxazosin 2mg  QHS which was subsequently increased to 4mg  05/13/20. At this time, the patient was also initiated on chlorthalidone 25mg  daily.   The patient was last seen in our clinic 06/02/20 at which time her home BP readings ranged from 130-140s in the morning to 120-130s in the afternoon. She was complaining of new onset tinnitus, orthostatic hypotension, poor appetite, fatique, and dizziness which has been much worse since increasing her dose of doxazosin. Recorded office blood pressure from the visit was 160/86, but the patient reported forgetting her morning medications that day. As such, the patient's doxazosin was discontinued.   Today, the patient states that she has been adjusting to her medications. Roughly 2wks ago she discovered that she had been taking her Cilostazol once daily when she should be taking it twice daily. Upon increasing the dose, she started feeling symptoms consistent with orthostatic hypotension. She has been feeling "yucky" since. When asked to elaborate, she states that she has mainly been very fatigued and tired. She denies falls.   In addition, the patient states that she is always in pain. She states that she sleep poorly d/t muscle pains and muscle spasms (tx with flexeril QHS).The maximum stretch of sleep she can remember is 4hrs at a time. Of note the patient takes 24mg  of melatonin at night and insists that she needs this high of a dose. She continued to work on smoking cessation (currenlty a "few" cigarettes/week, 14mg /24hr patch). Denies waking up  wanting a cigarette.   [facility] needs [facility] into [facility]    Current HTN meds: Losartan 100 mg daily. Chlorthalidone 25 mg daily. Carvedilol 25 mg twice daily.  Previously tried: Hydrochlorothiazide (orthostatic hypotension, dizziness). Lisinopril-HCTZ (cough). Amlodipine (dizziness, ringing in ears), doxazosin (lathargy, orthostasis) BP goal: <130/80  Family History: Heart disease (sister)  Social History: Smokes 5-6 cigarettes per week; uses nicotine patch (takes it off at night due to vivid bad dreams)  Diet: Eats plenty of fresh fruits and vegetables, which she grows herself. Enjoys pasta, chicken (baked), figs, stir fry, soups, salad. Enjoys ice cream, graham crackers w/ nutella. Drinks flavored water, 1 cup regular coffee daily (otherwise decaf), and no soda. Does not eat fast foods. Only uses salt to cook but does not add salt afterwards; has tried Mrs. DASH before.  - does not eat first in the morning (very light breakfast) -  Eats 1x big meal - rice, potatoes, airfried chicken/fish/lean beef  - Patient has been told to decrease carb and fruit intake d/t T2DM which is very upsetting to her  - Cooks primarily at home  Exercise: Uses the pool in summer, walks around yard/house in winter, gardens, pulls hoses, walks her dog. Exercise is limited by knee pain. Sweats a lot   Home BP readings:   10/1 AM 130/75 10/2 AM 107/67 10/4 AM 109/69 10/5 PM 111/65 10/6  AM 125/76 10/7 AM 82/53 - symptoms 10/8 PM 1-3/63  10/9 PM 123/70 10/11 PM 103/67 10/15 PM 99/63 10/16 PM 95/60 10/17 PM 88/54   Wt Readings from Last 3 Encounters:  05/05/20 155 lb 9.6 oz (70.6 kg)  04/05/20 150 lb (68 kg)  03/01/20 146 lb (66.2 kg)   BP Readings from Last 3 Encounters:  06/02/20 (!) 160/86  05/05/20 (!) 183/94  04/05/20 136/82   Pulse Readings from Last 3 Encounters:  06/02/20 90  05/05/20 69  04/05/20 75    Renal function: 06/02/20:  Scr 0.87 K 4.3  Sodium  137  Past Medical History:  Diagnosis Date  . Anxiety   . Blood transfusion without reported diagnosis 1982  . CAD (coronary artery disease), native coronary artery 03/2020   oronary CT showed a high coronary Ca score of 751 which was 97th% for age and sex matched controls.  There was moderate CAD of the proximal RCA 50-69% and long calcified plaque in the proximal LAD with 25-49%.  Coronary FFR did not show any flow limiting lesions and medical management was recommended.    Marland Kitchen GERD (gastroesophageal reflux disease)   . History of hepatitis C 10/13/2015  . Hyperlipidemia LDL goal <100 10/13/2015  . Hypertension   . Muscle spasm of left lower extremity 10/13/2015  . Nerve damage 2001   left side as result of fall  . PAD (peripheral artery disease) ([place])   . Pain of left hip joint 02/21/2018  . Plantar wart     Current Outpatient Medications on File Prior to Visit  Medication Sig Dispense Refill  . acetaminophen (TYLENOL) 500 MG tablet Take 1,000 mg by mouth every 6 (six) hours as needed for mild pain.    Marland Kitchen aspirin EC 81 MG tablet Take 81 mg by mouth daily.    . carvedilol (COREG) 25 MG tablet TAKE 1 TABLET(25 MG) BY MOUTH TWICE DAILY 180 tablet 3  . chlorthalidone (HYGROTON) 25 MG tablet Take 1 tablet (25 mg total) by mouth daily. 90 tablet 3  . cilostazol (PLETAL) 50 MG tablet TAKE 1 TABLET(50 MG) BY MOUTH TWICE DAILY 180 tablet 2  . clopidogrel (PLAVIX) 75 MG tablet Take 1 tablet (75 mg total) by mouth daily with breakfast. 30 tablet 11  . cyclobenzaprine (FLEXERIL) 10 MG tablet Take 1 tablet (10 mg total) by mouth 3 (three) times daily as needed. 90 tablet 0  . ezetimibe (ZETIA) 10 MG tablet Take 1 tablet (10 mg total) by mouth daily. 90 tablet 1  . Fish Oil-Cholecalciferol (FISH OIL + D3) 1200-1000 MG-UNIT CAPS Take 1 capsule by mouth daily.    . Glucosamine HCl (GLUCOSAMINE PO) Take 2,000 mg by mouth daily.    Marland Kitchen losartan (COZAAR) 100 MG tablet Take 1 tablet (100 mg total) by mouth daily.  90 tablet 3  . Melatonin 12 MG TABS Take 12 mg by mouth at bedtime.    . metFORMIN (GLUCOPHAGE) 500 MG tablet Take 1 tablet (500 mg total) by mouth daily with breakfast. 90 tablet 1  . Multiple Vitamin (MULTIVITAMIN WITH MINERALS) TABS tablet Take 1 tablet by mouth daily. Women's One-A-Day     . nicotine (NICODERM CQ) 14 mg/24hr patch Place 1 patch (14 mg total) onto the skin daily. For 6 weeks 42 patch 0  . nicotine (NICODERM CQ) 7 mg/24hr patch Place 1 patch (7 mg total) onto the skin daily. Use after you have completed the 14 mg patches 28 patch 0  . pantoprazole (PROTONIX)  40 MG tablet Take 1 tablet (40 mg total) by mouth daily. 30 tablet 3  . rosuvastatin (CRESTOR) 10 MG tablet Take 1 tablet (10 mg total) by mouth at bedtime. 90 tablet 1   No current facility-administered medications on file prior to visit.    Allergies  Allergen Reactions  . Amlodipine Other (See Comments)    Ringing in ears  . Hydrochlorothiazide     There were no vitals taken for this visit.   Assessment/Plan:  1. Hypertension -  The patient's BP is well below goal today (<130/80). Her reported symptoms of dizziness and fatigue are predominately attributed to bouts of BP <100/80 per her log, suggesting that she could benefit from antihypertensive medication decreasing. As the patient was controlled & asymptomatic x many years on losartan and carvedilol, will elect to decrease chlorthalidone at this time from 25mg  daily to 12.5mg  daily. As patient is almost out of chlorthalidone, will call in new prescription through [facility] per patient request. Encouraged patient to maintain low-salt diet, maintain current BP log. Will f/u with patient in 1 mo to assess BP control and symptoms of hypotension.   Commended patient on progress with smoking cessation and encouraged continuation of efforts.   Thank you,  Clinton Quant, Pharmacy Student Class Of [address] [place], [place].D, BCPS, CPP [facility]  [address] [place], [place] [address]  Phone: [phone]; Fax: [phone]

## 2020-06-20 ENCOUNTER — Other Ambulatory Visit: Payer: Self-pay

## 2020-06-20 ENCOUNTER — Ambulatory Visit (INDEPENDENT_AMBULATORY_CARE_PROVIDER_SITE_OTHER): Payer: Medicare Other | Admitting: Pharmacist

## 2020-06-20 VITALS — BP 108/62 | HR 71

## 2020-06-20 DIAGNOSIS — I10 Essential (primary) hypertension: Secondary | ICD-10-CM

## 2020-06-20 MED ORDER — CHLORTHALIDONE 25 MG PO TABS: 12.5000 mg | ORAL_TABLET | Freq: Every day | ORAL | 3 refills | Status: DC

## 2020-06-20 NOTE — Patient Instructions (Signed)
Thank you for seeing [place] today!  Your blood pressure has come down significantly since we last saw you. As such, we want to decrease your blood pressure medications today. If you continue to feel dizzy when standing and fatigued after these changes, please let us know.   Continue taking: - Losartan 100mg  daily - Carvedilol 25mg  twice daily   Decrease: - Chlorthalidone 25mg  daily to 12.5mg  (1/2 tablet) daily  Great work decreasing your smoking. Continue to work on smoking cessation by using the nicotine patches.   We will see you again in 1 month. Please call us if you need [place] in the meantime directly at [phone].  Thank you, Abby

## 2020-06-27 ENCOUNTER — Other Ambulatory Visit: Payer: Self-pay | Admitting: Family Medicine

## 2020-06-27 DIAGNOSIS — M62838 Other muscle spasm: Secondary | ICD-10-CM

## 2020-07-03 ENCOUNTER — Other Ambulatory Visit: Payer: Self-pay | Admitting: Family Medicine

## 2020-07-04 ENCOUNTER — Other Ambulatory Visit: Payer: Self-pay | Admitting: Family Medicine

## 2020-07-05 ENCOUNTER — Ambulatory Visit (INDEPENDENT_AMBULATORY_CARE_PROVIDER_SITE_OTHER): Payer: Medicare Other | Admitting: Family Medicine

## 2020-07-05 ENCOUNTER — Encounter: Payer: Self-pay | Admitting: Family Medicine

## 2020-07-05 ENCOUNTER — Other Ambulatory Visit: Payer: Self-pay

## 2020-07-05 VITALS — BP 149/85 | HR 74 | Temp 97.5°F | Ht 66.0 in | Wt 154.0 lb

## 2020-07-05 DIAGNOSIS — E1165 Type 2 diabetes mellitus with hyperglycemia: Secondary | ICD-10-CM | POA: Diagnosis not present

## 2020-07-05 DIAGNOSIS — D638 Anemia in other chronic diseases classified elsewhere: Secondary | ICD-10-CM

## 2020-07-05 DIAGNOSIS — F17200 Nicotine dependence, unspecified, uncomplicated: Secondary | ICD-10-CM | POA: Diagnosis not present

## 2020-07-05 DIAGNOSIS — Z139 Encounter for screening, unspecified: Secondary | ICD-10-CM | POA: Diagnosis not present

## 2020-07-05 DIAGNOSIS — I10 Essential (primary) hypertension: Secondary | ICD-10-CM | POA: Diagnosis not present

## 2020-07-05 NOTE — Progress Notes (Signed)
[facility]     Established Patient Office Visit  Subjective:  Patient ID: Margaret Kelly, female    DOB: [birth year]  Age: 69 y.o. MRN: [id number]  CC:  Chief Complaint  Patient presents with  . Hypertension  . Hyperlipidemia  . COPD  . Diabetes    HPI Margaret Kelly is a very pleasant 69 year old female with a medical history significant for essential hypertension, history of PAD, history of CAD, history of hepatitis C, GERD, type 2 diabetes mellitus, and tobacco dependence presents for a follow-up of hypertension, hyperlipidemia, and diabetes. Patient says that she has been doing well and is without complaint on today. Patient was recently started on Metformin 500 mg twice daily for type 2 diabetes mellitus.  Patient previously had prediabetes, attempted to control with diet alone.  However, after sigmoid volvulus, patient was eating an increased amount of fruit for added fiber, which in turn affected diabetes.  Previous hemoglobin A1c was 7.0.  Patient has been following a low-fat, low carbohydrate diet divided over small meals throughout the day.  She typically stays very active by working in her garden daily.  She denies any blurred vision, polyuria, polydipsia, or polyphagia. Patient has a history of essential hypertension.  She is followed consistently by cardiology.  Patient's antihypertensive medications were recently lower due to feelings of lightheadedness after taking medications.  Since reducing medications, symptoms have improved greatly.  She denies any lower extremity swelling, chest pain, heart rotations, or orthopnea.  Patient is a chronic everyday smoker who was attempted to quit at this time.  Patient has reduced cigarette again to only 1/day with her morning coffee.  She mostly wears a nicotine patch.  Past Medical History:  Diagnosis Date  . Anxiety   . Blood transfusion without reported diagnosis 1982  . CAD  (coronary artery disease), native coronary artery 03/2020   oronary CT showed a high coronary Ca score of 751 which was 97th% for age and sex matched controls.  There was moderate CAD of the proximal RCA 50-69% and long calcified plaque in the proximal LAD with 25-49%.  Coronary FFR did not show any flow limiting lesions and medical management was recommended.    Marland Kitchen GERD (gastroesophageal reflux disease)   . History of hepatitis C 10/13/2015  . Hyperlipidemia LDL goal <100 10/13/2015  . Hypertension   . Muscle spasm of left lower extremity 10/13/2015  . Nerve damage 2001   left side as result of fall  . PAD (peripheral artery disease) ([place])   . Pain of left hip joint 02/21/2018  . Plantar wart     Past Surgical History:  Procedure Laterality Date  . ABDOMINAL AORTOGRAM W/LOWER EXTREMITY Bilateral 03/26/2019   Procedure: ABDOMINAL AORTOGRAM W/LOWER EXTREMITY;  Surgeon: Lorretta Harp, MD;  Location: [facility];  Service: Cardiovascular;  Laterality: Bilateral;  . ABDOMINAL AORTOGRAM W/LOWER EXTREMITY Bilateral 08/03/2019   Procedure: ABDOMINAL AORTOGRAM W/LOWER EXTREMITY;  Surgeon: Lorretta Harp, MD;  Location: [facility];  Service: Cardiovascular;  Laterality: Bilateral;  . ABDOMINAL HYSTERECTOMY  1982  . CARPAL TUNNEL RELEASE Left 2005  . COLECTOMY    . FLEXIBLE SIGMOIDOSCOPY N/A 01/14/2020   Procedure: FLEXIBLE SIGMOIDOSCOPY;  Surgeon: Doran Stabler, MD;  Location: [facility];  Service: Gastroenterology;  Laterality: N/A;  . foot spur Left 2007   foot  . LAPAROTOMY N/A 01/14/2020   Procedure: OPEN RIGHT COLECTOMY, EXPLORATORY LAPAROTOMY;  Surgeon: Johney Maine,  Remo Lipps, MD;  Location: [facility];  Service: General;  Laterality: N/A;  . PERIPHERAL VASCULAR BALLOON ANGIOPLASTY Left 08/03/2019   Procedure: PERIPHERAL VASCULAR BALLOON ANGIOPLASTY;  Surgeon: Lorretta Harp, MD;  Location: [facility];  Service: Cardiovascular;  Laterality: Left;  SFA/ popliteal     Family History  Problem Relation Age of Onset  . Heart disease Sister        pacemaker  . Colon cancer Neg Hx   . Esophageal cancer Neg Hx   . Rectal cancer Neg Hx   . Stomach cancer Neg Hx     Social History   Socioeconomic History  . Marital status: Divorced    Spouse name: Not on file  . Number of children: Not on file  . Years of education: Not on file  . Highest education level: Not on file  Occupational History  . Not on file  Tobacco Use  . Smoking status: Former Smoker    Packs/day: 0.50    Types: Cigarettes  . Smokeless tobacco: Former Systems developer  . Tobacco comment: about half a pack per day  Vaping Use  . Vaping Use: Never used  Substance and Sexual Activity  . Alcohol use: Yes    Comment: rare  . Drug use: No  . Sexual activity: Not on file  Other Topics Concern  . Not on file  Social History Narrative  . Not on file   Social Determinants of Health   Financial Resource Strain:   . Difficulty of Paying Living Expenses: Not on file  Food Insecurity:   . Worried About Charity fundraiser in the Last Year: Not on file  . Ran Out of Food in the Last Year: Not on file  Transportation Needs:   . Lack of Transportation (Medical): Not on file  . Lack of Transportation (Non-Medical): Not on file  Physical Activity:   . Days of Exercise per Week: Not on file  . Minutes of Exercise per Session: Not on file  Stress:   . Feeling of Stress : Not on file  Social Connections:   . Frequency of Communication with Friends and Family: Not on file  . Frequency of Social Gatherings with Friends and Family: Not on file  . Attends Religious Services: Not on file  . Active Member of Clubs or Organizations: Not on file  . Attends Archivist Meetings: Not on file  . Marital Status: Not on file  Intimate Partner Violence:   . Fear of Current or Ex-Partner: Not on file  . Emotionally Abused: Not on file  . Physically Abused: Not on file  . Sexually Abused: Not  on file    Outpatient Medications Prior to Visit  Medication Sig Dispense Refill  . acetaminophen (TYLENOL) 500 MG tablet Take 1,000 mg by mouth every 6 (six) hours as needed for mild pain.    Marland Kitchen aspirin EC 81 MG tablet Take 81 mg by mouth daily.    . carvedilol (COREG) 25 MG tablet TAKE 1 TABLET(25 MG) BY MOUTH TWICE DAILY 180 tablet 3  . chlorthalidone (HYGROTON) 25 MG tablet Take 0.5 tablets (12.5 mg total) by mouth daily. 45 tablet 3  . cilostazol (PLETAL) 50 MG tablet TAKE 1 TABLET(50 MG) BY MOUTH TWICE DAILY 180 tablet 2  . clopidogrel (PLAVIX) 75 MG tablet Take 1 tablet (75 mg total) by mouth daily with breakfast. 30 tablet 11  . cyclobenzaprine (FLEXERIL) 10 MG tablet TAKE 1 TABLET BY MOUTH 3  TIMES  DAILY AS NEEDED FOR  MUSCLE SPASMS 90 tablet 0  . ezetimibe (ZETIA) 10 MG tablet TAKE 1 TABLET BY MOUTH  DAILY 90 tablet 3  . Fish Oil-Cholecalciferol (FISH OIL + D3) 1200-1000 MG-UNIT CAPS Take 1 capsule by mouth daily.    . Glucosamine HCl (GLUCOSAMINE PO) Take 2,000 mg by mouth daily.    Marland Kitchen losartan (COZAAR) 100 MG tablet Take 1 tablet (100 mg total) by mouth daily. 90 tablet 3  . Melatonin 12 MG TABS Take 12 mg by mouth at bedtime.    . metFORMIN (GLUCOPHAGE) 500 MG tablet Take 1 tablet (500 mg total) by mouth daily with breakfast. 90 tablet 1  . Multiple Vitamin (MULTIVITAMIN WITH MINERALS) TABS tablet Take 1 tablet by mouth daily. Women's One-A-Day     . nicotine (NICODERM CQ) 14 mg/24hr patch Place 1 patch (14 mg total) onto the skin daily. For 6 weeks 42 patch 0  . nicotine (NICODERM CQ) 7 mg/24hr patch Place 1 patch (7 mg total) onto the skin daily. Use after you have completed the 14 mg patches 28 patch 0  . pantoprazole (PROTONIX) 40 MG tablet TAKE 1 TABLET BY MOUTH  DAILY 30 tablet 11  . rosuvastatin (CRESTOR) 10 MG tablet TAKE 1 TABLET BY MOUTH AT  BEDTIME 90 tablet 3   No facility-administered medications prior to visit.    Allergies  Allergen Reactions  . Amlodipine Other  (See Comments)    Ringing in ears  . Hydrochlorothiazide     ROS Review of Systems  Constitutional: Negative.   HENT: Negative.   Respiratory: Negative.   Cardiovascular: Negative.   Gastrointestinal: Negative.   Endocrine: Negative.   Musculoskeletal: Positive for arthralgias. Negative for back pain.  Skin: Negative.   Neurological: Negative.  Negative for dizziness, weakness and numbness.  Psychiatric/Behavioral: Negative.       Objective:    Physical Exam Eyes:     Pupils: Pupils are equal, round, and reactive to light.  Cardiovascular:     Rate and Rhythm: Normal rate and regular rhythm.     Pulses: Normal pulses.  Pulmonary:     Effort: Pulmonary effort is normal.  Abdominal:     General: Bowel sounds are normal.     Palpations: Abdomen is soft.  Skin:    General: Skin is warm.  Neurological:     General: No focal deficit present.     Mental Status: She is alert. Mental status is at baseline.  Psychiatric:        Mood and Affect: Mood normal.        Behavior: Behavior normal.        Thought Content: Thought content normal.        Judgment: Judgment normal.     Ht 5\' 6"  (1.676 m)   Wt 154 lb (69.9 kg)   BMI 24.86 kg/m  Wt Readings from Last 3 Encounters:  07/05/20 154 lb (69.9 kg)  05/05/20 155 lb 9.6 oz (70.6 kg)  04/05/20 150 lb (68 kg)     Health Maintenance Due  Topic Date Due  . DEXA SCAN  Never done  . PNA vac Low Risk Adult (1 of 2 - PCV13) 05/22/2018  . MAMMOGRAM  11/13/2019    There are no preventive care reminders to display for this patient.  Lab Results  Component Value Date   TSH 1.860 01/05/2020   Lab Results  Component Value Date   WBC 10.7 (H) 01/22/2020   HGB 9.7 (L) 01/22/2020  HCT 29.0 (L) 01/22/2020   MCV 97.0 01/22/2020   PLT 425 (H) 01/22/2020   Lab Results  Component Value Date   NA 137 06/02/2020   K 4.3 06/02/2020   CO2 27 06/02/2020   GLUCOSE 163 (H) 06/02/2020   BUN 21 06/02/2020   CREATININE 0.87  06/02/2020   BILITOT 0.4 06/02/2020   ALKPHOS 63 06/02/2020   AST 21 06/02/2020   ALT 20 06/02/2020   PROT 7.7 06/02/2020   ALBUMIN 4.9 (H) 06/02/2020   CALCIUM 10.5 (H) 06/02/2020   ANIONGAP 5 01/22/2020   Lab Results  Component Value Date   CHOL 139 06/02/2020   Lab Results  Component Value Date   HDL 62 06/02/2020   Lab Results  Component Value Date   LDLCALC 53 06/02/2020   Lab Results  Component Value Date   TRIG 139 06/02/2020   Lab Results  Component Value Date   CHOLHDL 2.2 06/02/2020   Lab Results  Component Value Date   HGBA1C 7.0 (H) 06/02/2020      Assessment & Plan:   Problem List Items Addressed This Visit      Cardiovascular and Mediastinum   Essential hypertension   Relevant Orders   Basic metabolic panel   Urinalysis, Routine w reflex microscopic    Other Visit Diagnoses    Type 2 diabetes mellitus with hyperglycemia, without long-term current use of insulin (HCC)    -  Primary   Relevant Orders   Hemoglobin D5H   Basic metabolic panel   Urinalysis, Routine w reflex microscopic   Screening due       Relevant Orders   DG Bone Density   MM Digital Screening   Anemia of chronic disease       Relevant Orders   CBC   Tobacco dependence          1. Type 2 diabetes mellitus with hyperglycemia, without long-term current use of insulin (HCC) - Hemoglobin G9J - Basic metabolic panel; Future - Urinalysis, Routine w reflex microscopic  2. Essential hypertension BP (!) 149/85   Pulse 74   Temp (!) 97.5 F (36.4 C)   Ht 5\' 6"  (1.676 m)   Wt 154 lb (69.9 kg)   SpO2 100%   BMI 24.86 kg/m  - Continue medication, monitor blood pressure at home. Continue DASH diet. Reminder to go to the ER if any CP, SOB, nausea, dizziness, severe HA, changes vision/speech, left arm numbness and tingling and jaw pain.    - Basic metabolic panel; Future - Urinalysis, Routine w reflex microscopic  3. Screening due - DG Bone Density; Future - MM  Digital Screening; Future  4. Anemia of chronic disease Previous hemoglobin 9.7, will review results as they become available. - CBC  5. Tobacco dependence Smoking cessation instruction/counseling given:  counseled patient on the dangers of tobacco use, advised patient to stop smoking, and reviewed strategies to maximize success   Follow-up: Return in about 3 months (around 10/05/2020).     [name]  APRN, MSN, FNP-C [facility] [address] [place], [place] [address] [phone]

## 2020-07-05 NOTE — Telephone Encounter (Signed)
Please see patient request for medication.

## 2020-07-06 ENCOUNTER — Telehealth: Payer: Self-pay | Admitting: Family Medicine

## 2020-07-06 LAB — CBC
Hematocrit: 37.2 % (ref 34.0–46.6)
Hemoglobin: 12.7 g/dL (ref 11.1–15.9)
MCH: 31.7 pg (ref 26.6–33.0)
MCHC: 34.1 g/dL (ref 31.5–35.7)
MCV: 93 fL (ref 79–97)
Platelets: 352 10*3/uL (ref 150–450)
RBC: 4.01 x10E6/uL (ref 3.77–5.28)
RDW: 13.1 % (ref 11.7–15.4)
WBC: 9.6 10*3/uL (ref 3.4–10.8)

## 2020-07-06 LAB — URINALYSIS, ROUTINE W REFLEX MICROSCOPIC
Bilirubin, UA: NEGATIVE
Glucose, UA: NEGATIVE
Ketones, UA: NEGATIVE
Leukocytes,UA: NEGATIVE
Nitrite, UA: NEGATIVE
Protein,UA: NEGATIVE
RBC, UA: NEGATIVE
Specific Gravity, UA: 1.016 (ref 1.005–1.030)
Urobilinogen, Ur: 0.2 mg/dL (ref 0.2–1.0)
pH, UA: 7 (ref 5.0–7.5)

## 2020-07-06 LAB — HEMOGLOBIN A1C
Est. average glucose Bld gHb Est-mCnc: 154 mg/dL
Hgb A1c MFr Bld: 7 % — ABNORMAL HIGH (ref 4.8–5.6)

## 2020-07-06 NOTE — Telephone Encounter (Signed)
Left voicemail for callback.

## 2020-07-06 NOTE — Telephone Encounter (Signed)
-----   Message from Dorena Dew, [place] sent at 07/06/2020 11:44 AM EDT ----- Regarding: lab results Please inform patient that hemoglobin a1C is 7.0, which is consistent with previous. Also, 7.0 is at goal,  No changes in medications warranted. Continue a carbohydrate modified diet divided over small meals throughout the day. Also, increase daily physical activity. Hydrate consistently.   Will recheck hemoglobin a1C in 3 months.    [name]  APRN, MSN, FNP-C [facility] [address] [place], [place] [address] [phone]

## 2020-07-12 DIAGNOSIS — Z23 Encounter for immunization: Secondary | ICD-10-CM | POA: Diagnosis not present

## 2020-07-14 NOTE — Telephone Encounter (Signed)
Patient returned phone call, results given. Verbally understood. No additional questions.

## 2020-07-18 ENCOUNTER — Ambulatory Visit: Payer: Medicare Other

## 2020-07-20 NOTE — Progress Notes (Signed)
Patient ID: EMARA LICHTER                 DOB: 1951/01/23                      MRN: [id number]     HPI: Margaret Kelly is a 69 y.o. female referred by Dr. Radford Pax to HTN clinic. PMH is significant for CAD, HTN, HLD, anxiety, tobacco use.  The patient was originally seen by Dr. Radford Pax 05/05/2020 at which time his BP was >782 systolic on losartan 100mg  daily and carvedilol 25mg  BID. As such, the patient was started on doxazosin 2mg  QHS which was subsequently increased to 4mg  05/13/20. At this time, the patient was also initiated on chlorthalidone 25mg  daily. Doxazosin was discontinued after patient reported new onset tinnitus, orthostatic hypotension, poor appetite, fatigue, and dizziness which has been much worse since increasing her dose of doxazosin without much BP lowering (BP 160/86) on 06/02/20.  At last visit with PharmD clinic on 06/20/20, patient reported she was taking cilostazol once daily instead of twice daily as ordered. She reported fatigue with dose increase. Patient also reported pain which impacts her sleep. Patient takes melatonin for sleep. Home BP readings mostly 956O systolic with one reading of 82/53 with hypotension symptoms and one reading of 130/75. Office reading 108/62. Chlorthalidone was decreased to 12.5 mg daily. Patient called family medicine clinic 07/06/20 with tinnitus complaint, she will be seen by family medicine for this.  Patient presents today in good spirits.  Recently diagnosed diabetic so has changed her diet and is focusing on proteins, vegetables, and high fiber carbohydrates.  Gardens and is active in her yard extensively.  Brought home BP log: 11/16: 120/79 11/15: 129/81 11/14: 125/71 11/13: 108/61 11/12: 106/59 11/11: 96/54 (felt tired and weak) 11/10: 129/67 11/09: 117/64   Current HTN meds: Losartan 100 mg daily. Chlorthalidone 12.5 mg daily. Carvedilol 25 mg twice daily.  Previously tried: Hydrochlorothiazide (orthostatic hypotension, dizziness).  Lisinopril-HCTZ (cough). Amlodipine (dizziness, ringing in ears), doxazosin (lathargy, orthostasis) BP goal: <130/80  Family History: Heart disease (sister)  Social History: Smokes 2-4 cigarettes a day uses nicotine patch (takes it off at night due to vivid bad dreams)  Diet: drinks 1 cup of coffee daily  Eats plenty of fresh fruits and vegetables, which she grows herself. Enjoys pasta, chicken (baked), figs, stir fry, soups, salad. Enjoys ice cream, graham crackers w/ nutella. Drinks flavored water, 1 cup regular coffee daily (otherwise decaf), and no soda. Does not eat fast foods. Only uses salt to cook but does not add salt afterwards; has tried Mrs. DASH before.  - does not eat first in the morning (very light breakfast) -  Eats 1x big meal - rice, potatoes, airfried chicken/fish/lean beef  - Patient has been told to decrease carb and fruit intake d/t T2DM which is very upsetting to her  - Cooks primarily at home  Exercise: Uses the pool in summer, walks around yard/house in winter, gardens, pulls hoses, walks her dog. Exercise is limited by knee pain. Sweats a lot   Home BP readings:   10/1 AM 130/75 10/2 AM 107/67 10/4 AM 109/69 10/5 PM 111/65 10/6 AM 125/76 10/7 AM 82/53 - symptoms 10/8 PM 1-3/63  10/9 PM 123/70 10/11 PM 103/67 10/15 PM 99/63 10/16 PM 95/60 10/17 PM 88/54   Wt Readings from Last 3 Encounters:  07/05/20 154 lb (69.9 kg)  05/05/20 155 lb 9.6 oz (70.6 kg)  04/05/20 150 lb (  68 kg)   BP Readings from Last 3 Encounters:  07/05/20 (!) 149/85  06/20/20 108/62  06/02/20 (!) 160/86   Pulse Readings from Last 3 Encounters:  07/05/20 74  06/20/20 71  06/02/20 90    Renal function: 06/02/20:  Scr 0.87 K 4.3  Sodium 137  Past Medical History:  Diagnosis Date  . Anxiety   . Blood transfusion without reported diagnosis 1982  . CAD (coronary artery disease), native coronary artery 03/2020   oronary CT showed a high coronary Ca score of 751 which  was 97th% for age and sex matched controls.  There was moderate CAD of the proximal RCA 50-69% and long calcified plaque in the proximal LAD with 25-49%.  Coronary FFR did not show any flow limiting lesions and medical management was recommended.    Marland Kitchen GERD (gastroesophageal reflux disease)   . History of hepatitis C 10/13/2015  . Hyperlipidemia LDL goal <100 10/13/2015  . Hypertension   . Muscle spasm of left lower extremity 10/13/2015  . Nerve damage 2001   left side as result of fall  . PAD (peripheral artery disease) ([facility])   . Pain of left hip joint 02/21/2018  . Plantar wart     Current Outpatient Medications on File Prior to Visit  Medication Sig Dispense Refill  . acetaminophen (TYLENOL) 500 MG tablet Take 1,000 mg by mouth every 6 (six) hours as needed for mild pain.    Marland Kitchen aspirin EC 81 MG tablet Take 81 mg by mouth daily.    . carvedilol (COREG) 25 MG tablet TAKE 1 TABLET(25 MG) BY MOUTH TWICE DAILY 180 tablet 3  . chlorthalidone (HYGROTON) 25 MG tablet Take 0.5 tablets (12.5 mg total) by mouth daily. 45 tablet 3  . cilostazol (PLETAL) 50 MG tablet TAKE 1 TABLET(50 MG) BY MOUTH TWICE DAILY 180 tablet 2  . clopidogrel (PLAVIX) 75 MG tablet Take 1 tablet (75 mg total) by mouth daily with breakfast. 30 tablet 11  . cyclobenzaprine (FLEXERIL) 10 MG tablet TAKE 1 TABLET BY MOUTH 3  TIMES DAILY AS NEEDED FOR  MUSCLE SPASMS 90 tablet 0  . ezetimibe (ZETIA) 10 MG tablet TAKE 1 TABLET BY MOUTH  DAILY 90 tablet 3  . Fish Oil-Cholecalciferol (FISH OIL + D3) 1200-1000 MG-UNIT CAPS Take 1 capsule by mouth daily.    . Glucosamine HCl (GLUCOSAMINE PO) Take 2,000 mg by mouth daily.    Marland Kitchen losartan (COZAAR) 100 MG tablet Take 1 tablet (100 mg total) by mouth daily. 90 tablet 3  . Melatonin 12 MG TABS Take 12 mg by mouth at bedtime.    . metFORMIN (GLUCOPHAGE) 500 MG tablet Take 1 tablet (500 mg total) by mouth daily with breakfast. 90 tablet 1  . Multiple Vitamin (MULTIVITAMIN WITH MINERALS) TABS tablet  Take 1 tablet by mouth daily. Women's One-A-Day     . nicotine (NICODERM CQ) 14 mg/24hr patch Place 1 patch (14 mg total) onto the skin daily. For 6 weeks 42 patch 0  . nicotine (NICODERM CQ) 7 mg/24hr patch Place 1 patch (7 mg total) onto the skin daily. Use after you have completed the 14 mg patches 28 patch 0  . pantoprazole (PROTONIX) 40 MG tablet TAKE 1 TABLET BY MOUTH  DAILY 30 tablet 11  . rosuvastatin (CRESTOR) 10 MG tablet TAKE 1 TABLET BY MOUTH AT  BEDTIME 90 tablet 3   No current facility-administered medications on file prior to visit.    Allergies  Allergen Reactions  . Amlodipine Other (  See Comments)    Ringing in ears  . Hydrochlorothiazide     There were no vitals taken for this visit.   Assessment/Plan:  1. Hypertension -   Patient BP today was 138/74 which is above goal of <130/80, however all home readings at goal.  Patient said her BP was above goal at last PCP visit too so she may have some white coat HTN.  Patient's hypotensive episodes have reduced, only 2 in last month and patient is satisfied.  Educated on low salt, reduced carbohydrate diet choices due to patients hx of HTN and recently diagnosed DM.  Encouraged patrient to continue being active in yard and continue to keep BP log.  Since all home readings at goal, do not need to titrate/add any new medications but advised patient to continue to monitor at home and call clinic if numbers begin to trend up.  Continue carvedilol 25mg  BID Continue chlorthalidone 12.5 mg daily Continue losartan 100mg  daily  Karren Cobble, PharmD, BCACP, [facility] [address], [place], [place] [address] Phone: [phone]; Fax: [phone] 07/21/2020 3:45 PM

## 2020-07-21 ENCOUNTER — Encounter: Payer: Self-pay | Admitting: Pharmacist

## 2020-07-21 ENCOUNTER — Ambulatory Visit (INDEPENDENT_AMBULATORY_CARE_PROVIDER_SITE_OTHER): Payer: Medicare Other | Admitting: Pharmacist

## 2020-07-21 ENCOUNTER — Other Ambulatory Visit: Payer: Self-pay

## 2020-07-21 VITALS — BP 138/74 | HR 75

## 2020-07-21 DIAGNOSIS — I10 Essential (primary) hypertension: Secondary | ICD-10-CM

## 2020-07-21 NOTE — Patient Instructions (Signed)
It was good meeting you today!  We would like your blood pressure to be less than 130/80  Continue to garden and watch your diet, especially salt content and your carbohydrates  Continue your losartan 100mg  daily, chlorthalidone 12.5mg  daily, and carvedilol 25 mg twice daily  Karren Cobble, PharmD, BCACP, [facility] [address] [place], [place] [address] Phone: [phone]; Fax: [phone] 07/21/2020 3:22 PM

## 2020-07-26 ENCOUNTER — Other Ambulatory Visit: Payer: Self-pay | Admitting: Family Medicine

## 2020-07-26 ENCOUNTER — Telehealth: Payer: Self-pay | Admitting: Family Medicine

## 2020-07-26 DIAGNOSIS — M62838 Other muscle spasm: Secondary | ICD-10-CM

## 2020-07-26 MED ORDER — CYCLOBENZAPRINE HCL 10 MG PO TABS: ORAL_TABLET | ORAL | 1 refills | Status: DC

## 2020-07-26 NOTE — Progress Notes (Signed)
Meds ordered this encounter  Medications  . cyclobenzaprine (FLEXERIL) 10 MG tablet    Sig: TAKE 1 TABLET BY MOUTH 3  TIMES DAILY AS NEEDED FOR  MUSCLE SPASMS    Dispense:  90 tablet    Refill:  1    Order Specific Question:   Supervising Provider    AnswerTresa Garter [id number]

## 2020-07-27 NOTE — Telephone Encounter (Signed)
Done

## 2020-08-10 ENCOUNTER — Ambulatory Visit (HOSPITAL_COMMUNITY)
Admission: RE | Admit: 2020-08-10 | Payer: Medicare Other | Source: Ambulatory Visit | Attending: Cardiovascular Disease | Admitting: Cardiovascular Disease

## 2020-08-10 ENCOUNTER — Telehealth: Payer: Self-pay | Admitting: Licensed Clinical Social Worker

## 2020-08-10 ENCOUNTER — Telehealth: Payer: Self-pay | Admitting: Family Medicine

## 2020-08-10 NOTE — Telephone Encounter (Signed)
   LAYZA SUMMA DOB: 1951/06/20 MRN: [id number]   RIDER WAIVER AND RELEASE OF LIABILITY  For purposes of improving physical access to our facilities, [facility] is pleased to partner with third parties to provide [facility] patients or other authorized individuals the option of convenient, on-demand ground transportation services (the [place]") through use of the technology service that enables users to request on-demand ground transportation from independent third-party providers.  By opting to use and accept these [employer], I, the undersigned, hereby agree on behalf of myself, and on behalf of any minor child using the [employer] for whom I am the parent or legal guardian, as follows:  1. Government social research officer provided to me are provided by independent third-party transportation providers who are not [employer] or employees and who are unaffiliated with [employer]. 2. [facility] is neither a transportation carrier nor a common or public carrier. 3. [facility] has no control over the quality or safety of the transportation that occurs as a result of the [employer]. 4. [facility] cannot guarantee that any third-party transportation provider will complete any arranged transportation service. 5. [facility] makes no representation, warranty, or guarantee regarding the reliability, timeliness, quality, safety, suitability, or availability of any of the Transport Services or that they will be error free. 6. I fully understand that traveling by vehicle involves risks and dangers of serious bodily injury, including permanent disability, paralysis, and death. I agree, on behalf of myself and on behalf of any minor child using the Transport Services for whom I am the parent or legal guardian, that the entire risk arising out of my use of the [employer] remains solely with me, to the maximum extent permitted under applicable law. 7. The [employer] are provided "as is" and "as available." [facility] disclaims all representations and warranties, express, implied or statutory, not expressly set out in these terms, including the implied warranties of merchantability and fitness for a particular purpose. 8. I hereby waive and release [facility], its agents, employees, officers, directors, representatives, insurers, attorneys, assigns, successors, subsidiaries, and affiliates from any and all past, present, or future claims, demands, liabilities, actions, causes of action, or suits of any kind directly or indirectly arising from acceptance and use of the [employer]. 9. I further waive and release [facility] and its affiliates from all present and future liability and responsibility for any injury or death to persons or damages to property caused by or related to the use of the [employer]. 10. I have read this Waiver and Release of Liability, and I understand the terms used in it and their legal significance. This Waiver is freely and voluntarily given with the understanding that my right (as well as the right of any minor child for whom I am the parent or legal guardian using the [employer]) to legal recourse against [facility] in connection with the [employer] is knowingly surrendered in return for use of these services.   I attest that I read the consent document to [name], gave Ms. Walkowski the opportunity to ask questions and answered the questions asked (if any). I affirm that [name] then provided consent for she's participation in this program.     Drucie Ip

## 2020-08-10 NOTE — Telephone Encounter (Signed)
CSW received referral, pt had to reschedule today's appointment due to lack of transportation.   CSW called pt at [phone]. Introduced self, role, reason for call. Pt from home alone, she generally schedules her appointment rides through [facility]. Today she was scheduled w/ a ride service that ended up not coming and caused her to miss the appointment. CSW explained that [facility] has a Chiropodist that pt would be able to be referred to for the rescheduled appointment on 12/21 at 2pm. Pt in agreement. Confirmed phone number and that it receives calls/texts as well as home address. Pt ambulates w/ no DME. She is going on a trip to [place] but was provided my number and [employer] number and is to call midway through next week if she does not hear from either of [place].   Information to contact pt provided to [employer]. Will f/u by 12/15 if I have not heard back from [employer].   Westley Hummer, MSW, [place]  [phone]

## 2020-08-12 ENCOUNTER — Ambulatory Visit
Admission: RE | Admit: 2020-08-12 | Discharge: 2020-08-12 | Disposition: A | Discharge status: Home / Self Care | Payer: Medicare Other | Source: Ambulatory Visit | Attending: Family Medicine | Admitting: Family Medicine

## 2020-08-12 ENCOUNTER — Telehealth: Payer: Self-pay | Admitting: Licensed Clinical Social Worker

## 2020-08-12 ENCOUNTER — Other Ambulatory Visit: Payer: Self-pay

## 2020-08-12 DIAGNOSIS — Z1231 Encounter for screening mammogram for malignant neoplasm of breast: Secondary | ICD-10-CM | POA: Diagnosis not present

## 2020-08-12 DIAGNOSIS — Z139 Encounter for screening, unspecified: Secondary | ICD-10-CM

## 2020-08-12 NOTE — Telephone Encounter (Signed)
CSW received a call from pt who was having difficulty with her scheduled [facility] Medicare transportation. She was inquiring as to if [facility] Transportation could assist her in any way. CSW called [facility] and they were able to schedule a ride home for pt from appt today. I also mailed a [employer] card and additional transportation resources along w/ my contact information to pt home address as previously confirmed.   Westley Hummer, MSW, [place]  [phone]

## 2020-08-18 ENCOUNTER — Telehealth: Payer: Self-pay | Admitting: Licensed Clinical Social Worker

## 2020-08-18 NOTE — Telephone Encounter (Signed)
CSW f/u with pt regarding scheduling transportation to upcoming appointment- pt confirms she has the number for [employer] and will call tomorrow. No additional needs identified at this time.   Westley Hummer, MSW, [place]  [phone]

## 2020-08-23 ENCOUNTER — Other Ambulatory Visit (HOSPITAL_COMMUNITY): Payer: Self-pay | Admitting: Cardiovascular Disease

## 2020-08-23 ENCOUNTER — Other Ambulatory Visit (HOSPITAL_COMMUNITY): Payer: Self-pay | Admitting: Emergency Medicine

## 2020-08-23 ENCOUNTER — Other Ambulatory Visit: Payer: Self-pay

## 2020-08-23 ENCOUNTER — Ambulatory Visit (HOSPITAL_COMMUNITY)
Admission: RE | Admit: 2020-08-23 | Discharge: 2020-08-23 | Disposition: A | Discharge status: Home / Self Care | Payer: Medicare Other | Source: Ambulatory Visit | Attending: Cardiovascular Disease | Admitting: Cardiovascular Disease

## 2020-08-23 DIAGNOSIS — I739 Peripheral vascular disease, unspecified: Secondary | ICD-10-CM

## 2020-08-29 ENCOUNTER — Telehealth: Payer: Self-pay | Admitting: Pharmacist

## 2020-08-29 MED ORDER — CHLORTHALIDONE 25 MG PO TABS: ORAL_TABLET | ORAL | 3 refills | Status: DC

## 2020-08-29 NOTE — Telephone Encounter (Signed)
Spoke with patient.  Has been feeling poorly recenty which she thought was due to her blood sugar.  However started taking her blood pressure and systolic readings were in the low 100s or less.  Had been taking 12.5mg  of chlorthalidone (1/2 of a 25mg ) but reported she had issues splitting it sometimes and was not getting an even cut.  She has been therefore cutting some of the pieces again so she is taking 1/3 to 1/4th of a tablet once a day.  Blood pressure readings now in 110s-120s and patient feels much better. Advised this is acceptable and she can continue but to contact if readings begin to rise.

## 2020-09-01 ENCOUNTER — Telehealth: Payer: Self-pay | Admitting: Family Medicine

## 2020-09-02 NOTE — Telephone Encounter (Signed)
Sent to provider 

## 2020-09-21 ENCOUNTER — Other Ambulatory Visit: Payer: Self-pay | Admitting: Family Medicine

## 2020-09-21 ENCOUNTER — Telehealth: Payer: Self-pay | Admitting: Family Medicine

## 2020-09-21 DIAGNOSIS — M62838 Other muscle spasm: Secondary | ICD-10-CM

## 2020-09-21 MED ORDER — CYCLOBENZAPRINE HCL 10 MG PO TABS: ORAL_TABLET | ORAL | 1 refills | Status: DC

## 2020-09-21 NOTE — Progress Notes (Signed)
Meds ordered this encounter  Medications  . cyclobenzaprine (FLEXERIL) 10 MG tablet    Sig: TAKE 1 TABLET BY MOUTH 3  TIMES DAILY AS NEEDED FOR  MUSCLE SPASMS    Dispense:  90 tablet    Refill:  1    Order Specific Question:   Supervising Provider    Answer:   Tresa Garter [[id number]]    [name]  APRN, MSN, FNP-C Patient [facility] [address] [place], [place] [address] [phone]

## 2020-09-23 NOTE — Telephone Encounter (Signed)
Done

## 2020-09-27 ENCOUNTER — Other Ambulatory Visit: Payer: Self-pay | Admitting: Family Medicine

## 2020-10-04 ENCOUNTER — Ambulatory Visit (INDEPENDENT_AMBULATORY_CARE_PROVIDER_SITE_OTHER): Payer: Medicare Other | Admitting: Family Medicine

## 2020-10-04 ENCOUNTER — Other Ambulatory Visit: Payer: Self-pay | Admitting: Family Medicine

## 2020-10-04 ENCOUNTER — Encounter: Payer: Self-pay | Admitting: Family Medicine

## 2020-10-04 ENCOUNTER — Other Ambulatory Visit: Payer: Self-pay

## 2020-10-04 VITALS — BP 177/81 | HR 71 | Temp 98.6°F | Ht 66.0 in | Wt 154.0 lb

## 2020-10-04 DIAGNOSIS — F17200 Nicotine dependence, unspecified, uncomplicated: Secondary | ICD-10-CM

## 2020-10-04 DIAGNOSIS — E1165 Type 2 diabetes mellitus with hyperglycemia: Secondary | ICD-10-CM

## 2020-10-04 DIAGNOSIS — E785 Hyperlipidemia, unspecified: Secondary | ICD-10-CM

## 2020-10-04 DIAGNOSIS — I739 Peripheral vascular disease, unspecified: Secondary | ICD-10-CM

## 2020-10-04 DIAGNOSIS — I10 Essential (primary) hypertension: Secondary | ICD-10-CM

## 2020-10-04 LAB — POCT URINALYSIS DIPSTICK
Bilirubin, UA: NEGATIVE
Glucose, UA: NEGATIVE
Ketones, UA: NEGATIVE
Leukocytes, UA: NEGATIVE
Nitrite, UA: NEGATIVE
Protein, UA: NEGATIVE
Spec Grav, UA: 1.025 (ref 1.010–1.025)
Urobilinogen, UA: 0.2 E.U./dL
pH, UA: 6.5 (ref 5.0–8.0)

## 2020-10-04 NOTE — Progress Notes (Signed)
[facility]   Established Patient Office Visit  Subjective:  Patient ID: Margaret Kelly, female    DOB: October 13, 1950  Age: 70 y.o. MRN: [id number]  CC:  Chief Complaint  Patient presents with  . Follow-up    3 month follow     HPI Margaret Kelly is a 89 year of female with a medical history significant for hypertension, PAD, CAD, generalized anxiety, history of hepatitis C, GERD, hyperlipidemia, myalgias, and tobacco dependence presents for a follow up of type II diabetes mellitus.  Patient states that she is doing well and is without complaint today.  She is in the process of relocating to [place]. Diabetes She presents for her follow-up diabetic visit. She has type 2 diabetes mellitus. Her disease course has been stable. Pertinent negatives for hypoglycemia include no sweats. Pertinent negatives for diabetes include no blurred vision, no chest pain, no fatigue, no polydipsia, no polyphagia and no polyuria. There are no hypoglycemic complications. Symptoms are stable. There are no diabetic complications. When asked about current treatments, none were reported. She is compliant with treatment all of the time. She has not had a previous visit with a dietitian. She participates in exercise intermittently. An ACE inhibitor/angiotensin II receptor blocker is being taken. She does not see a podiatrist.Eye exam is not current.  Hypertension The problem is controlled. Pertinent negatives include no anxiety, blurred vision, chest pain, palpitations, peripheral edema, PND, shortness of breath or sweats. Risk factors for coronary artery disease include smoking/tobacco exposure. There are no compliance problems.  Hypertensive end-organ damage includes CAD/MI. There is no history of kidney disease or heart failure.    Past Medical History:  Diagnosis Date  . Anxiety   . Blood transfusion without reported diagnosis 1982  . CAD (coronary artery disease),  native coronary artery 03/2020   oronary CT showed a high coronary Ca score of 751 which was 97th% for age and sex matched controls.  There was moderate CAD of the proximal RCA 50-69% and long calcified plaque in the proximal LAD with 25-49%.  Coronary FFR did not show any flow limiting lesions and medical management was recommended.    Marland Kitchen GERD (gastroesophageal reflux disease)   . History of hepatitis C 10/13/2015  . Hyperlipidemia LDL goal <100 10/13/2015  . Hypertension   . Muscle spasm of left lower extremity 10/13/2015  . Nerve damage 2001   left side as result of fall  . PAD (peripheral artery disease) (HCC)   . Pain of left hip joint 02/21/2018  . Plantar wart     Past Surgical History:  Procedure Laterality Date  . ABDOMINAL AORTOGRAM W/LOWER EXTREMITY Bilateral 03/26/2019   Procedure: ABDOMINAL AORTOGRAM W/LOWER EXTREMITY;  Surgeon: Runell Gess, MD;  Location: [facility] INVASIVE CV LAB;  Service: Cardiovascular;  Laterality: Bilateral;  . ABDOMINAL AORTOGRAM W/LOWER EXTREMITY Bilateral 08/03/2019   Procedure: ABDOMINAL AORTOGRAM W/LOWER EXTREMITY;  Surgeon: Runell Gess, MD;  Location: [facility] INVASIVE CV LAB;  Service: Cardiovascular;  Laterality: Bilateral;  . ABDOMINAL HYSTERECTOMY  1982  . CARPAL TUNNEL RELEASE Left 2005  . COLECTOMY    . FLEXIBLE SIGMOIDOSCOPY N/A 01/14/2020   Procedure: FLEXIBLE SIGMOIDOSCOPY;  Surgeon: Sherrilyn Rist, MD;  Location: [facility] ENDOSCOPY;  Service: Gastroenterology;  Laterality: N/A;  . foot spur Left 2007   foot  . LAPAROTOMY N/A 01/14/2020   Procedure: OPEN RIGHT COLECTOMY, EXPLORATORY LAPAROTOMY;  Surgeon: Karie Soda, MD;  Location: [facility] ORS;  Service: General;  Laterality: N/A;  . PERIPHERAL VASCULAR BALLOON ANGIOPLASTY Left 08/03/2019   Procedure: PERIPHERAL VASCULAR BALLOON ANGIOPLASTY;  Surgeon: Lorretta Harp, MD;  Location: [facility] CV LAB;  Service: Cardiovascular;  Laterality: Left;  SFA/ popliteal    Family History  Problem  Relation Age of Onset  . Heart disease Sister        pacemaker  . Colon cancer Neg Hx   . Esophageal cancer Neg Hx   . Rectal cancer Neg Hx   . Stomach cancer Neg Hx     Social History   Socioeconomic History  . Marital status: Divorced    Spouse name: Not on file  . Number of children: Not on file  . Years of education: Not on file  . Highest education level: Not on file  Occupational History  . Not on file  Tobacco Use  . Smoking status: Current Some Day Smoker    Packs/day: 0.00    Types: Cigarettes  . Smokeless tobacco: Former Systems developer  . Tobacco comment: about half a pack per day  Vaping Use  . Vaping Use: Never used  Substance and Sexual Activity  . Alcohol use: Yes    Comment: rare  . Drug use: No  . Sexual activity: Not on file  Other Topics Concern  . Not on file  Social History Narrative  . Not on file   Social Determinants of Health   Financial Resource Strain: Not on file  Food Insecurity: Not on file  Transportation Needs: Unmet Transportation Needs  . Lack of Transportation (Medical): Yes  . Lack of Transportation (Non-Medical): Yes  Physical Activity: Not on file  Stress: Not on file  Social Connections: Not on file  Intimate Partner Violence: Not on file    Outpatient Medications Prior to Visit  Medication Sig Dispense Refill  . acetaminophen (TYLENOL) 500 MG tablet Take 1,000 mg by mouth every 6 (six) hours as needed for mild pain.    Marland Kitchen aspirin EC 81 MG tablet Take 81 mg by mouth daily.    . carvedilol (COREG) 25 MG tablet TAKE 1 TABLET(25 MG) BY MOUTH TWICE DAILY 180 tablet 3  . chlorthalidone (HYGROTON) 25 MG tablet Patient taking 1/3 to 1/4 tablet once daily 45 tablet 3  . cilostazol (PLETAL) 50 MG tablet TAKE 1 TABLET BY MOUTH  TWICE DAILY 180 tablet 3  . clopidogrel (PLAVIX) 75 MG tablet Take 1 tablet (75 mg total) by mouth daily with breakfast. 30 tablet 11  . cyclobenzaprine (FLEXERIL) 10 MG tablet TAKE 1 TABLET BY MOUTH 3  TIMES DAILY AS  NEEDED FOR  MUSCLE SPASMS 90 tablet 1  . ezetimibe (ZETIA) 10 MG tablet TAKE 1 TABLET BY MOUTH  DAILY 90 tablet 3  . Fish Oil-Cholecalciferol (FISH OIL + D3) 1200-1000 MG-UNIT CAPS Take 1 capsule by mouth daily.    . Glucosamine HCl (GLUCOSAMINE PO) Take 2,000 mg by mouth daily.    Marland Kitchen losartan (COZAAR) 100 MG tablet Take 1 tablet (100 mg total) by mouth daily. 90 tablet 3  . Melatonin 12 MG TABS Take 12 mg by mouth at bedtime.    . metFORMIN (GLUCOPHAGE) 500 MG tablet Take 1 tablet (500 mg total) by mouth daily with breakfast. 90 tablet 1  . Multiple Vitamin (MULTIVITAMIN WITH MINERALS) TABS tablet Take 1 tablet by mouth daily. Women's One-A-Day    . nicotine (NICODERM CQ) 14 mg/24hr patch Place 1 patch (14 mg total) onto the skin daily. For 6 weeks 42 patch 0  .  nicotine (NICODERM CQ) 7 mg/24hr patch Place 1 patch (7 mg total) onto the skin daily. Use after you have completed the 14 mg patches 28 patch 0  . pantoprazole (PROTONIX) 40 MG tablet TAKE 1 TABLET BY MOUTH  DAILY 30 tablet 11  . rosuvastatin (CRESTOR) 10 MG tablet TAKE 1 TABLET BY MOUTH AT  BEDTIME 90 tablet 3   No facility-administered medications prior to visit.    Allergies  Allergen Reactions  . Amlodipine Other (See Comments)    Ringing in ears  . Hydrochlorothiazide     ROS Review of Systems  Constitutional: Negative for activity change, appetite change and fatigue.  HENT: Negative.   Eyes: Negative.  Negative for blurred vision.  Respiratory: Negative.  Negative for shortness of breath.   Cardiovascular: Negative for chest pain, palpitations, leg swelling and PND.  Gastrointestinal: Negative.   Endocrine: Negative for polydipsia, polyphagia and polyuria.  Genitourinary: Negative.   Musculoskeletal: Negative.   Skin: Negative.   Psychiatric/Behavioral: Negative.       Objective:    Physical Exam Constitutional:      Appearance: Normal appearance.  Eyes:     Pupils: Pupils are equal, round, and reactive to  light.  Cardiovascular:     Rate and Rhythm: Normal rate.     Pulses: Normal pulses.  Pulmonary:     Effort: Pulmonary effort is normal.  Abdominal:     General: Abdomen is flat. Bowel sounds are normal.  Musculoskeletal:        General: Normal range of motion.  Skin:    General: Skin is warm.  Neurological:     General: No focal deficit present.     Mental Status: She is alert. Mental status is at baseline.  Psychiatric:        Mood and Affect: Mood normal.        Thought Content: Thought content normal.        Judgment: Judgment normal.     BP (!) 177/81 (BP Location: Right Arm, Patient Position: Sitting)   Pulse 71   Temp 98.6 F (37 C) (Temporal)   Ht 5\' 6"  (1.676 m)   Wt 154 lb (69.9 kg)   SpO2 98%   BMI 24.86 kg/m  Wt Readings from Last 3 Encounters:  10/04/20 154 lb (69.9 kg)  07/05/20 154 lb (69.9 kg)  05/05/20 155 lb 9.6 oz (70.6 kg)     Health Maintenance Due  Topic Date Due  . DEXA SCAN  Never done  . PNA vac Low Risk Adult (1 of 2 - PCV13) 05/22/2018  . COVID-19 Vaccine (3 - Booster for Pfizer series) 07/08/2020    There are no preventive care reminders to display for this patient.  Lab Results  Component Value Date   TSH 1.860 01/05/2020   Lab Results  Component Value Date   WBC 9.6 07/05/2020   HGB 12.7 07/05/2020   HCT 37.2 07/05/2020   MCV 93 07/05/2020   PLT 352 07/05/2020   Lab Results  Component Value Date   NA 137 06/02/2020   K 4.3 06/02/2020   CO2 27 06/02/2020   GLUCOSE 163 (H) 06/02/2020   BUN 21 06/02/2020   CREATININE 0.87 06/02/2020   BILITOT 0.4 06/02/2020   ALKPHOS 63 06/02/2020   AST 21 06/02/2020   ALT 20 06/02/2020   PROT 7.7 06/02/2020   ALBUMIN 4.9 (H) 06/02/2020   CALCIUM 10.5 (H) 06/02/2020   ANIONGAP 5 01/22/2020   Lab Results  Component Value  Date   CHOL 139 06/02/2020   Lab Results  Component Value Date   HDL 62 06/02/2020   Lab Results  Component Value Date   LDLCALC 53 06/02/2020   Lab  Results  Component Value Date   TRIG 139 06/02/2020   Lab Results  Component Value Date   CHOLHDL 2.2 06/02/2020   Lab Results  Component Value Date   HGBA1C 7.0 (H) 07/05/2020      Assessment & Plan:   Problem List Items Addressed This Visit      Cardiovascular and Mediastinum   Peripheral arterial disease ([facility] [place])   Essential hypertension   Relevant Orders   CMP and Liver    Other Visit Diagnoses    Type 2 diabetes mellitus with hyperglycemia, without long-term current use of insulin ([facility])    -  Primary   Relevant Orders   Urinalysis Dipstick   CMP and Liver   Hemoglobin A1c   Tobacco dependence       Hyperlipidemia, unspecified hyperlipidemia type          Type 2 diabetes mellitus with hyperglycemia, without long-term current use of insulin (HCC) Hemoglobin A1c has improved to 6.6 from 7.0.  Patient is at goal.  We will continue Metformin.  Also, patient advised to follow a low-fat, low carbohydrate diet divided over 5-6 small meals throughout the day.  Also, incorporate low impact cardiovascular exercise 3-times per week. - Urinalysis Dipstick - CMP and Liver - Hemoglobin A1c  Essential hypertension - Continue medication, monitor blood pressure at home. Continue DASH diet. Reminder to go to the ER if any CP, SOB, nausea, dizziness, severe HA, changes vision/speech, left arm numbness and tingling and jaw pain.    - CMP and Liver  Tobacco dependence Smoking cessation instruction/counseling given:  counseled patient on the dangers of tobacco use, advised patient to stop smoking, and reviewed strategies to maximize success  Hyperlipidemia, unspecified hyperlipidemia type The patient is asked to make an attempt to improve diet and exercise patterns to aid in medical management of this problem.  Follow-up:  Patient is relocating to [place], patient to find PCP.    [name]  APRN, MSN, FNP-C Patient [facility] [address] [place], [place] [address] [phone]

## 2020-10-04 NOTE — Patient Instructions (Addendum)
I have enjoyed having you as a patient throughout the years. Good luck in your future endeavors. I will definitely miss you.

## 2020-10-05 LAB — CMP AND LIVER
ALT: 19 IU/L (ref 0–32)
AST: 18 IU/L (ref 0–40)
Albumin: 4.7 g/dL (ref 3.8–4.8)
Alkaline Phosphatase: 69 IU/L (ref 44–121)
BUN: 17 mg/dL (ref 8–27)
Bilirubin Total: 0.4 mg/dL (ref 0.0–1.2)
Bilirubin, Direct: 0.14 mg/dL (ref 0.00–0.40)
CO2: 25 mmol/L (ref 20–29)
Calcium: 9.9 mg/dL (ref 8.7–10.3)
Chloride: 104 mmol/L (ref 96–106)
Creatinine, Ser: 0.73 mg/dL (ref 0.57–1.00)
GFR calc Af Amer: 99 mL/min/{1.73_m2} (ref >59)
GFR calc non Af Amer: 85 mL/min/{1.73_m2} (ref >59)
Glucose: 127 mg/dL — ABNORMAL HIGH (ref 65–99)
Potassium: 5.2 mmol/L (ref 3.5–5.2)
Sodium: 141 mmol/L (ref 134–144)
Total Protein: 7.6 g/dL (ref 6.0–8.5)

## 2020-10-05 LAB — HEMOGLOBIN A1C
Est. average glucose Bld gHb Est-mCnc: 143 mg/dL
Hgb A1c MFr Bld: 6.6 % — ABNORMAL HIGH (ref 4.8–5.6)

## 2020-10-07 ENCOUNTER — Telehealth: Payer: Self-pay

## 2020-10-07 NOTE — Telephone Encounter (Signed)
Called notified patient of her labs , advised of watching her diet.

## 2020-10-07 NOTE — Telephone Encounter (Signed)
-----   Message from Dorena Dew, [place] sent at 10/07/2020  2:54 PM EST ----- Regarding: lab results Please inform patient that hemoglobin A1c has improved to 6.6 from 7.03 months ago.  Please continue a low-fat, low carbohydrate diet divided over small meals throughout the day.  No medication changes warranted at this time.  [name]  APRN, MSN, FNP-C Patient [facility] [address] [place], [place] [address] [phone]

## 2020-10-14 ENCOUNTER — Other Ambulatory Visit: Payer: Medicare Other

## 2020-11-01 ENCOUNTER — Ambulatory Visit: Payer: Medicare Other | Admitting: Cardiology

## 2020-11-14 ENCOUNTER — Other Ambulatory Visit: Payer: Self-pay | Admitting: Family Medicine

## 2020-11-14 DIAGNOSIS — M62838 Other muscle spasm: Secondary | ICD-10-CM

## 2020-11-14 MED ORDER — CYCLOBENZAPRINE HCL 10 MG PO TABS: ORAL_TABLET | ORAL | 3 refills | Status: AC

## 2020-11-14 NOTE — Progress Notes (Signed)
Meds ordered this encounter  Medications  . cyclobenzaprine (FLEXERIL) 10 MG tablet    Sig: TAKE 1 TABLET BY MOUTH 3  TIMES DAILY AS NEEDED FOR  MUSCLE SPASMS    Dispense:  90 tablet    Refill:  3    Order Specific Question:   Supervising Provider    Answer:   Tresa Garter [[id number]]     [name]  APRN, MSN, FNP-C Patient [facility] [address] [place], [place] [address] [phone]

## 2020-12-06 ENCOUNTER — Other Ambulatory Visit: Payer: Self-pay | Admitting: Family Medicine

## 2021-01-10 ENCOUNTER — Other Ambulatory Visit: Payer: Self-pay | Admitting: Family Medicine

## 2021-01-10 DIAGNOSIS — I10 Essential (primary) hypertension: Secondary | ICD-10-CM

## 2021-01-10 DIAGNOSIS — M62838 Other muscle spasm: Secondary | ICD-10-CM

## 2021-04-23 ENCOUNTER — Other Ambulatory Visit: Payer: Self-pay | Admitting: Cardiology

## 2021-05-07 ENCOUNTER — Other Ambulatory Visit: Payer: Self-pay | Admitting: Family Medicine

## 2021-05-17 ENCOUNTER — Telehealth: Payer: Self-pay

## 2021-05-17 NOTE — Telephone Encounter (Signed)
Patient called, left VM to return the call to the office to schedule an AWV with a nurse.

## 2021-05-25 ENCOUNTER — Other Ambulatory Visit: Payer: Self-pay | Admitting: Family Medicine

## 2021-05-25 ENCOUNTER — Telehealth: Payer: Self-pay

## 2021-05-25 NOTE — Telephone Encounter (Signed)
Calling pt to complete AWV, pt has [employer], not medicare.

## 2021-06-04 ENCOUNTER — Other Ambulatory Visit: Payer: Self-pay | Admitting: Family Medicine

## 2021-06-04 DIAGNOSIS — E1165 Type 2 diabetes mellitus with hyperglycemia: Secondary | ICD-10-CM

## 2021-07-10 ENCOUNTER — Other Ambulatory Visit: Payer: Self-pay | Admitting: Family Medicine

## 2021-07-22 IMAGING — CT CT ABD-PELV W/ CM
2 of 5 series · 15 of 46 positions shown, 17 images · IV contrast (OMNIPAQUE 300)
Comparison: June 19, 2019

CLINICAL DATA: No bowel movement in 2 days. Abdominal pain.

EXAM:
CT ABDOMEN AND PELVIS WITH CONTRAST
TECHNIQUE: Multidetector CT imaging of the abdomen and pelvis was performed
using the standard protocol following bolus administration of
intravenous contrast.
CONTRAST:  100mL OMNIPAQUE IOHEXOL 300 MG/ML  SOLN

[Series 2: axial st · axial · 0.65mm/px · z∈[-452,-72]mm · 12 of 88 slices shown, 14 images]
[im 6/88  soft-tissue]
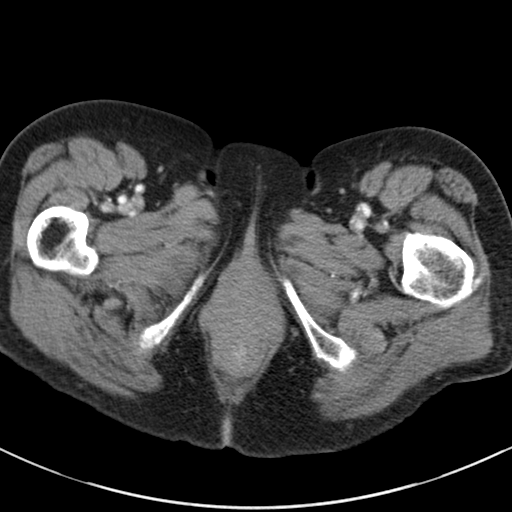
[im 6/88  bone]
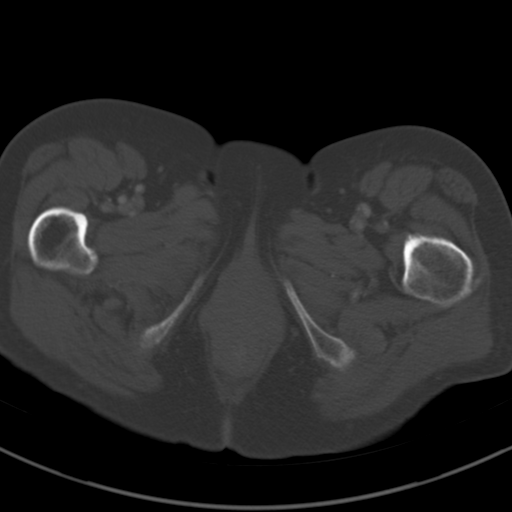
[im 12/88  soft-tissue]
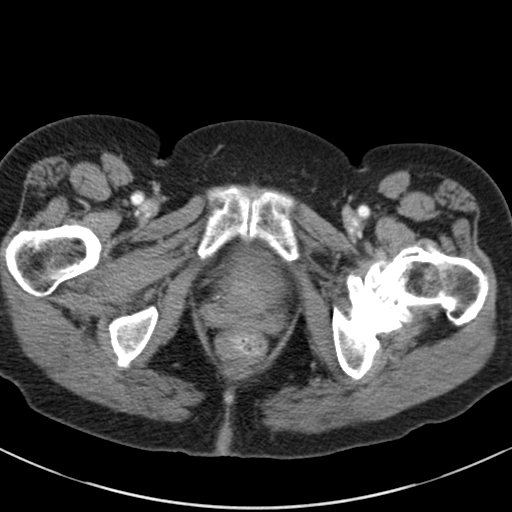
[im 18/88  soft-tissue]
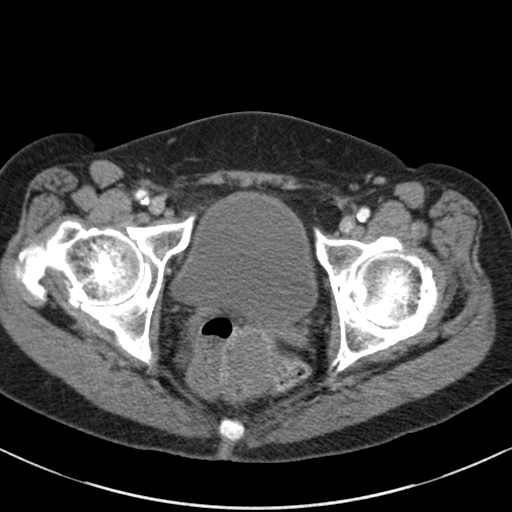
[im 30/88  soft-tissue]
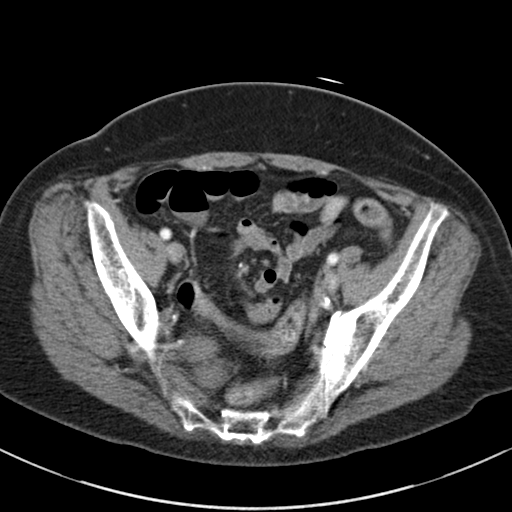
[im 35/88  soft-tissue]
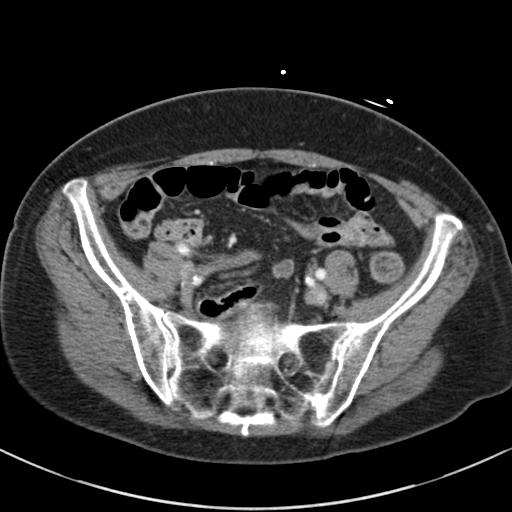
[im 41/88  soft-tissue]
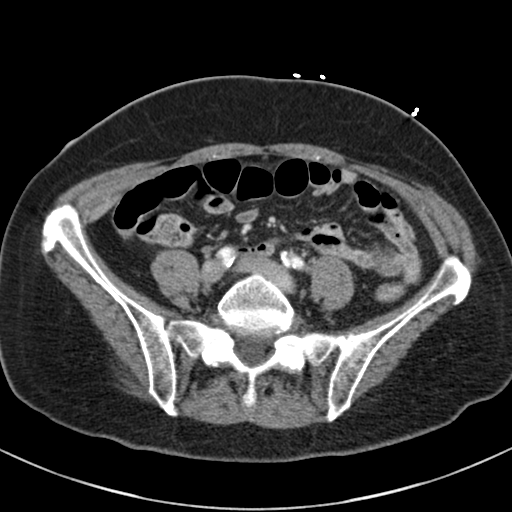
[im 47/88  soft-tissue]
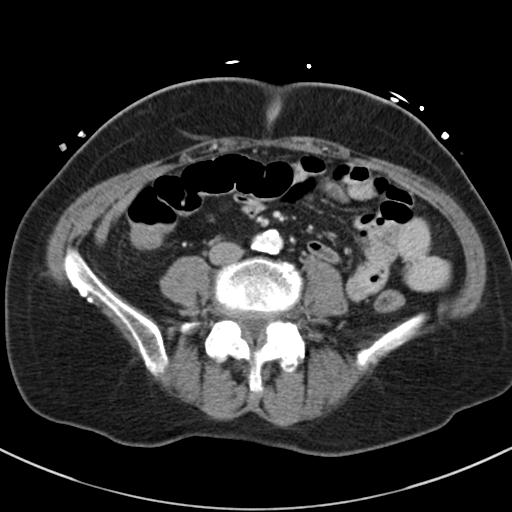
[im 53/88  soft-tissue]
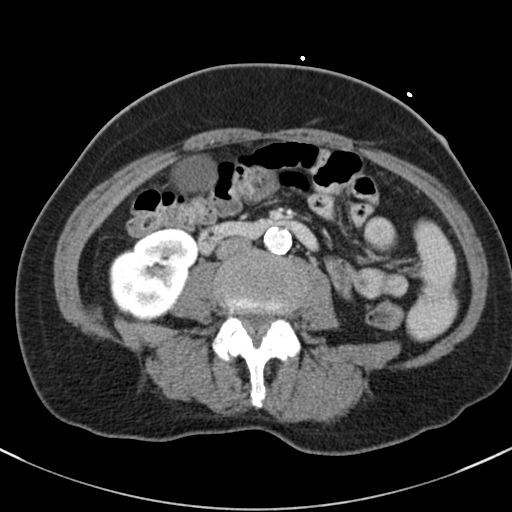
[im 59/88  soft-tissue]
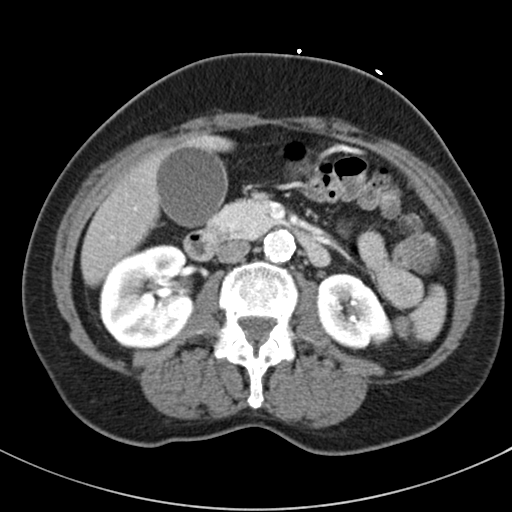
[im 59/88  bone]
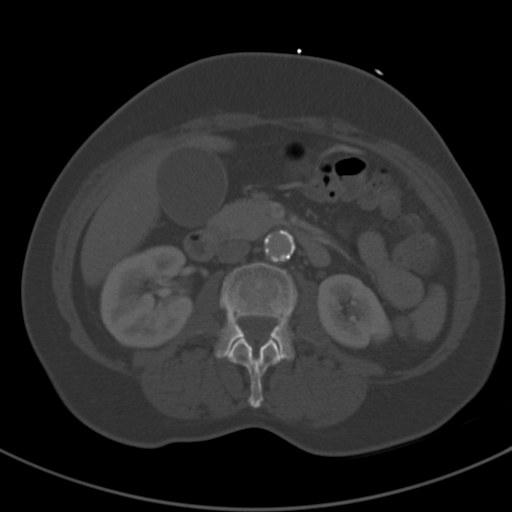
[im 70/88  soft-tissue]
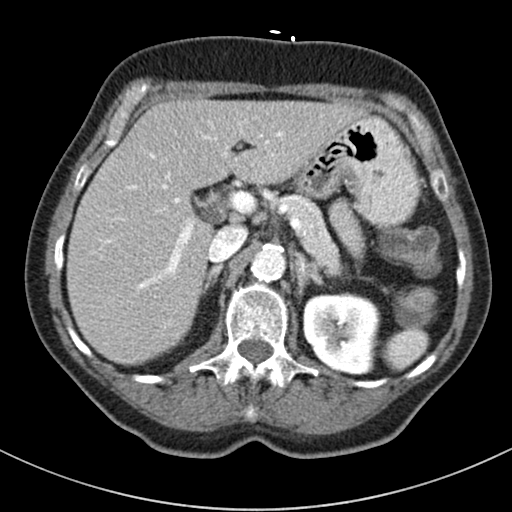
[im 76/88  soft-tissue]
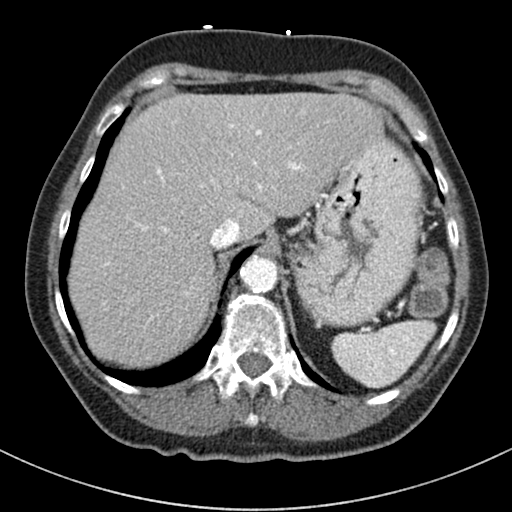
[im 82/88  soft-tissue]
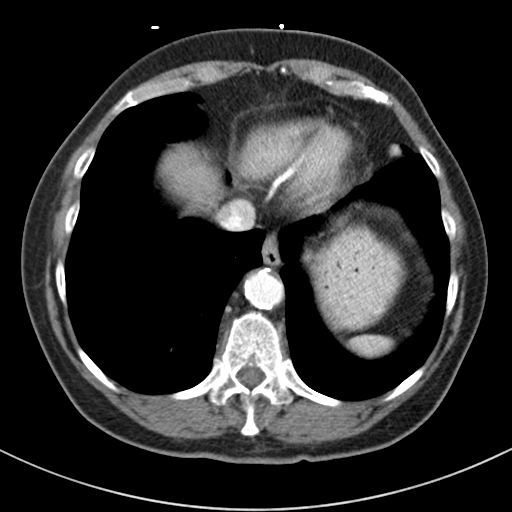

[Series 5: coronal st · coronal · 0.58mm/px · 3 of 126 slices shown]
[im 42/126  soft-tissue]
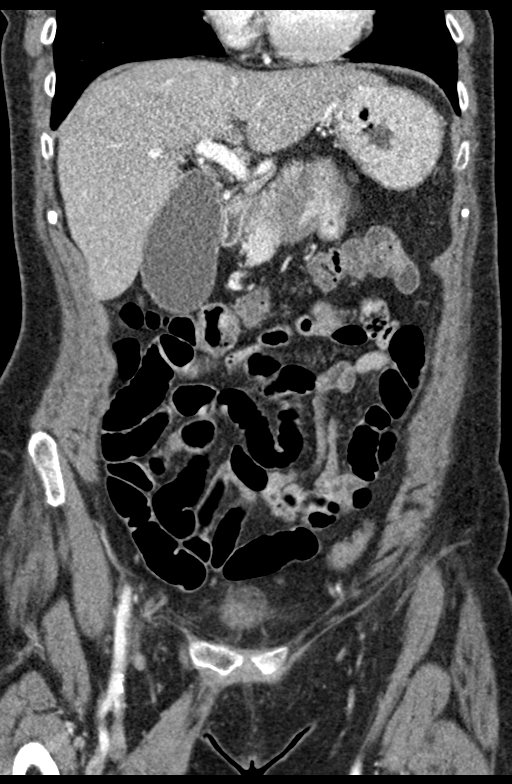
[im 56/126  soft-tissue]
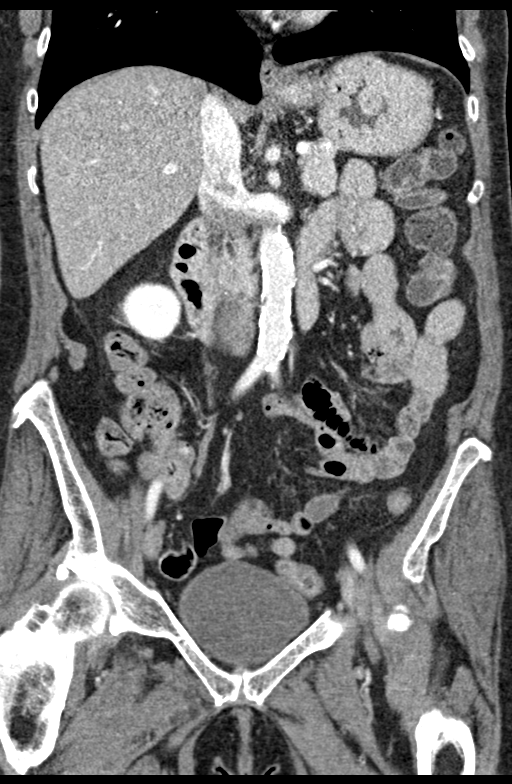
[im 70/126  soft-tissue]
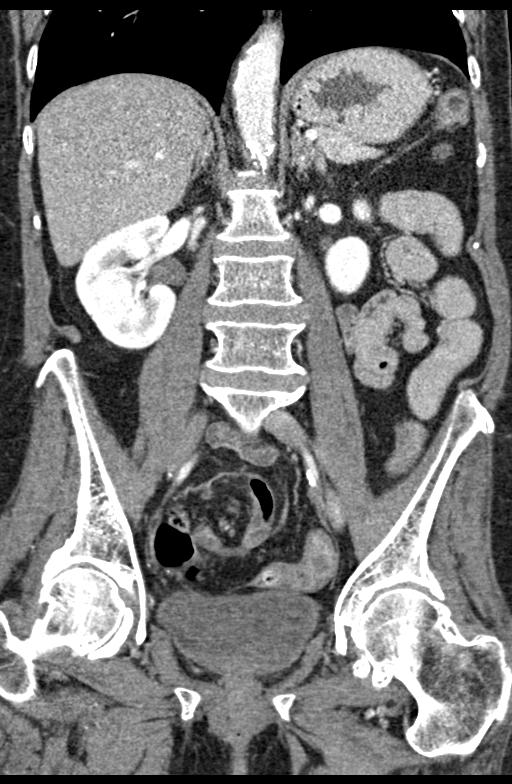

[15 of 46 positions shown; findings below may reference images not displayed]

FINDINGS: Lower chest: No acute abnormality.

Hepatobiliary: No focal liver abnormality is seen. No gallstones,
gallbladder wall thickening, or biliary dilatation.

Pancreas: Unremarkable. No pancreatic ductal dilatation or
surrounding inflammatory changes.

Spleen: Normal in size without focal abnormality.

Adrenals/Urinary Tract: Adrenal glands are unremarkable. Kidneys are
normal, without renal calculi, focal lesion, or hydronephrosis.
Bladder is unremarkable.

Stomach/Bowel: Stomach is within normal limits. No evidence of small
bowel wall thickening, distention, or inflammatory changes. Average
stool burden throughout the colon which is normal in caliber.
Nonspecific thickening of the distal rectum/anus.

Vascular/Lymphatic: Aortic atherosclerosis. No enlarged abdominal or
pelvic lymph nodes.

Reproductive: Status post hysterectomy. No adnexal masses.

Other: No abdominal wall hernia or abnormality. No abdominopelvic
ascites.

Musculoskeletal: Spondylosis of the lumbosacral spine.
IMPRESSION: 1. No evidence of acute abnormalities within the solid abdominal
organs.
2. Average stool burden throughout the colon.
3. Nonspecific thickening of the distal rectum/anus. This may
represent a soft tissue mass or internal hemorrhoids. Please
correlate to physical exam findings.

Aortic Atherosclerosis ([id number]).

## 2021-08-23 ENCOUNTER — Encounter (HOSPITAL_COMMUNITY): Payer: Medicare Other

## 2021-09-29 ENCOUNTER — Telehealth: Payer: Self-pay

## 2021-09-29 NOTE — Telephone Encounter (Signed)
Attempted to contact for Medicare Annual Wellness visit left VM to call back.
